# Patient Record
Sex: Female | Born: 1945 | Race: White | Hispanic: No | Smoking: Current every day smoker
Health system: Southern US, Community
[De-identification: ages and names within clinical notes are randomized; demographics above are authoritative.]

## PROBLEM LIST (undated history)

## (undated) DIAGNOSIS — F0150 Vascular dementia without behavioral disturbance: Secondary | ICD-10-CM

## (undated) DIAGNOSIS — C801 Malignant (primary) neoplasm, unspecified: Secondary | ICD-10-CM

## (undated) DIAGNOSIS — E785 Hyperlipidemia, unspecified: Secondary | ICD-10-CM

## (undated) DIAGNOSIS — F0390 Unspecified dementia without behavioral disturbance: Secondary | ICD-10-CM

## (undated) DIAGNOSIS — G309 Alzheimer's disease, unspecified: Secondary | ICD-10-CM

## (undated) DIAGNOSIS — R4701 Aphasia: Secondary | ICD-10-CM

## (undated) DIAGNOSIS — F0280 Dementia in other diseases classified elsewhere without behavioral disturbance: Secondary | ICD-10-CM

## (undated) DIAGNOSIS — R569 Unspecified convulsions: Secondary | ICD-10-CM

## (undated) HISTORY — PX: ABDOMINAL HYSTERECTOMY: SHX81

---

## 2012-07-19 ENCOUNTER — Inpatient Hospital Stay: Payer: Self-pay | Admitting: Internal Medicine

## 2012-07-19 LAB — CBC WITH DIFFERENTIAL/PLATELET
Basophil %: 1.1 %
Eosinophil #: 0 10*3/uL (ref 0.0–0.7)
Eosinophil %: 0.1 %
HCT: 37.9 % (ref 35.0–47.0)
HGB: 12.7 g/dL (ref 12.0–16.0)
Lymphocyte #: 1.3 10*3/uL (ref 1.0–3.6)
Lymphocyte %: 18.1 %
MCH: 33.8 pg (ref 26.0–34.0)
MCHC: 33.5 g/dL (ref 32.0–36.0)
Monocyte #: 0.7 x10 3/mm (ref 0.2–0.9)
Neutrophil #: 5.2 10*3/uL (ref 1.4–6.5)
Neutrophil %: 71.3 %
Platelet: 187 10*3/uL (ref 150–440)
RBC: 3.76 10*6/uL — ABNORMAL LOW (ref 3.80–5.20)
RDW: 14.2 % (ref 11.5–14.5)

## 2012-07-19 LAB — COMPREHENSIVE METABOLIC PANEL
Albumin: 4 g/dL (ref 3.4–5.0)
Anion Gap: 5 — ABNORMAL LOW (ref 7–16)
Calcium, Total: 9.6 mg/dL (ref 8.5–10.1)
Co2: 28 mmol/L (ref 21–32)
Creatinine: 0.87 mg/dL (ref 0.60–1.30)
EGFR (African American): >60
Potassium: 3.5 mmol/L (ref 3.5–5.1)
SGPT (ALT): 21 U/L (ref 12–78)
Sodium: 143 mmol/L (ref 136–145)
Total Protein: 6.9 g/dL (ref 6.4–8.2)

## 2012-07-19 LAB — CK TOTAL AND CKMB (NOT AT ARMC)
CK, Total: 441 U/L — ABNORMAL HIGH (ref 21–215)
CK-MB: 10.9 ng/mL — ABNORMAL HIGH (ref 0.5–3.6)

## 2012-07-19 LAB — ETHANOL
Ethanol %: <0.003 % (ref 0.000–0.080)
Ethanol: <3 mg/dL

## 2012-07-19 LAB — URINALYSIS, COMPLETE
Bacteria: NONE SEEN
Bilirubin,UR: NEGATIVE
Nitrite: NEGATIVE
RBC,UR: 2 /HPF (ref 0–5)
WBC UR: 16 /HPF (ref 0–5)

## 2012-07-19 LAB — TSH: Thyroid Stimulating Horm: 2.58 u[IU]/mL

## 2012-07-20 DIAGNOSIS — R55 Syncope and collapse: Secondary | ICD-10-CM

## 2012-07-20 LAB — BASIC METABOLIC PANEL
Co2: 28 mmol/L (ref 21–32)
Glucose: 121 mg/dL — ABNORMAL HIGH (ref 65–99)
Osmolality: 300 (ref 275–301)
Sodium: 145 mmol/L (ref 136–145)

## 2012-07-20 LAB — CK: CK, Total: 433 U/L — ABNORMAL HIGH (ref 21–215)

## 2012-07-20 LAB — CBC WITH DIFFERENTIAL/PLATELET
HGB: 14.2 g/dL (ref 12.0–16.0)
Lymphocyte #: 1.3 10*3/uL (ref 1.0–3.6)
MCV: 102 fL — ABNORMAL HIGH (ref 80–100)
Monocyte #: 0.5 x10 3/mm (ref 0.2–0.9)
Monocyte %: 7.7 %
Platelet: 176 10*3/uL (ref 150–440)
RBC: 4.13 10*6/uL (ref 3.80–5.20)

## 2012-07-20 LAB — TROPONIN I: Troponin-I: 0.04 ng/mL

## 2012-07-20 LAB — MAGNESIUM: Magnesium: 1.8 mg/dL

## 2012-07-21 LAB — BASIC METABOLIC PANEL
BUN: 17 mg/dL (ref 7–18)
BUN: 13 mg/dL (ref 7–18)
Calcium, Total: 7.8 mg/dL — ABNORMAL LOW (ref 8.5–10.1)
Chloride: 115 mmol/L — ABNORMAL HIGH (ref 98–107)
Co2: 27 mmol/L (ref 21–32)
Creatinine: 0.45 mg/dL — ABNORMAL LOW (ref 0.60–1.30)
Creatinine: 0.8 mg/dL (ref 0.60–1.30)
EGFR (African American): >60
EGFR (African American): >60
EGFR (Non-African Amer.): >60
Glucose: 75 mg/dL (ref 65–99)
Glucose: 106 mg/dL — ABNORMAL HIGH (ref 65–99)
Osmolality: 284 (ref 275–301)
Potassium: 3.1 mmol/L — ABNORMAL LOW (ref 3.5–5.1)
Potassium: 3.2 mmol/L — ABNORMAL LOW (ref 3.5–5.1)
Sodium: 148 mmol/L — ABNORMAL HIGH (ref 136–145)
Sodium: 142 mmol/L (ref 136–145)

## 2012-07-21 LAB — MAGNESIUM
Magnesium: 1.4 mg/dL — ABNORMAL LOW
Magnesium: 1.5 mg/dL — ABNORMAL LOW

## 2012-08-03 LAB — URINALYSIS, COMPLETE
Bacteria: NONE SEEN
Bilirubin,UR: NEGATIVE
Glucose,UR: NEGATIVE mg/dL (ref 0–75)
Protein: NEGATIVE
Squamous Epithelial: 1
WBC UR: 3 /HPF (ref 0–5)

## 2012-08-03 LAB — COMPREHENSIVE METABOLIC PANEL
Albumin: 3.6 g/dL (ref 3.4–5.0)
Alkaline Phosphatase: 66 U/L (ref 50–136)
Anion Gap: 5 — ABNORMAL LOW (ref 7–16)
BUN: 23 mg/dL — ABNORMAL HIGH (ref 7–18)
Chloride: 109 mmol/L — ABNORMAL HIGH (ref 98–107)
EGFR (African American): >60
EGFR (Non-African Amer.): >60
Glucose: 128 mg/dL — ABNORMAL HIGH (ref 65–99)
Osmolality: 288 (ref 275–301)
SGOT(AST): 39 U/L — ABNORMAL HIGH (ref 15–37)
SGPT (ALT): 21 U/L (ref 12–78)
Sodium: 142 mmol/L (ref 136–145)
Total Protein: 7.1 g/dL (ref 6.4–8.2)

## 2012-08-03 LAB — DRUG SCREEN, URINE
Amphetamines, Ur Screen: NEGATIVE (ref ?–1000)
Benzodiazepine, Ur Scrn: NEGATIVE (ref ?–200)
MDMA (Ecstasy)Ur Screen: NEGATIVE (ref ?–500)
Methadone, Ur Screen: NEGATIVE (ref ?–300)
Phencyclidine (PCP) Ur S: NEGATIVE (ref ?–25)
Tricyclic, Ur Screen: NEGATIVE (ref ?–1000)

## 2012-08-03 LAB — CBC
HGB: 13.3 g/dL (ref 12.0–16.0)
MCHC: 33.8 g/dL (ref 32.0–36.0)
MCV: 100 fL (ref 80–100)
RBC: 3.95 10*6/uL (ref 3.80–5.20)

## 2012-08-03 LAB — ETHANOL: Ethanol: <3 mg/dL

## 2012-08-03 LAB — TSH: Thyroid Stimulating Horm: 0.97 u[IU]/mL

## 2012-08-04 ENCOUNTER — Inpatient Hospital Stay: Payer: Self-pay | Admitting: Internal Medicine

## 2012-08-04 LAB — CK TOTAL AND CKMB (NOT AT ARMC)
CK, Total: 270 U/L — ABNORMAL HIGH (ref 21–215)
CK, Total: 186 U/L (ref 21–215)
CK, Total: 172 U/L (ref 21–215)
CK-MB: 7.9 ng/mL — ABNORMAL HIGH (ref 0.5–3.6)
CK-MB: 4.8 ng/mL — ABNORMAL HIGH (ref 0.5–3.6)
CK-MB: 4.2 ng/mL — ABNORMAL HIGH (ref 0.5–3.6)

## 2012-08-04 LAB — TROPONIN I: Troponin-I: <0.02 ng/mL

## 2012-08-05 LAB — CBC WITH DIFFERENTIAL/PLATELET
Basophil #: 0 10*3/uL (ref 0.0–0.1)
Basophil %: 0.6 %
Eosinophil #: 0.1 10*3/uL (ref 0.0–0.7)
Eosinophil %: 1.3 %
HCT: 33.3 % — ABNORMAL LOW (ref 35.0–47.0)
HGB: 11.3 g/dL — ABNORMAL LOW (ref 12.0–16.0)
Lymphocyte %: 30.2 %
MCH: 34.1 pg — ABNORMAL HIGH (ref 26.0–34.0)
MCHC: 33.9 g/dL (ref 32.0–36.0)
Monocyte #: 0.4 x10 3/mm (ref 0.2–0.9)
Neutrophil %: 60 %
Platelet: 292 10*3/uL (ref 150–440)
RBC: 3.31 10*6/uL — ABNORMAL LOW (ref 3.80–5.20)
WBC: 4.6 10*3/uL (ref 3.6–11.0)

## 2012-08-05 LAB — COMPREHENSIVE METABOLIC PANEL WITH GFR
Albumin: 2.6 g/dL — ABNORMAL LOW
Alkaline Phosphatase: 50 U/L
Anion Gap: 5 — ABNORMAL LOW
BUN: 22 mg/dL — ABNORMAL HIGH
Bilirubin,Total: 0.2 mg/dL
Calcium, Total: 7.4 mg/dL — ABNORMAL LOW
Chloride: 115 mmol/L — ABNORMAL HIGH
Co2: 26 mmol/L
Creatinine: 0.62 mg/dL
EGFR (African American): >60
EGFR (Non-African Amer.): >60
Glucose: 65 mg/dL
Osmolality: 292
Potassium: 3.5 mmol/L
SGOT(AST): 30 U/L
SGPT (ALT): 15 U/L
Sodium: 146 mmol/L — ABNORMAL HIGH
Total Protein: 5.1 g/dL — ABNORMAL LOW

## 2012-08-05 LAB — LIPID PANEL
Cholesterol: 137 mg/dL (ref 0–200)
HDL Cholesterol: 37 mg/dL — ABNORMAL LOW (ref 40–60)
Ldl Cholesterol, Calc: 82 mg/dL (ref 0–100)

## 2012-08-05 LAB — MAGNESIUM: Magnesium: 1.8 mg/dL

## 2012-08-06 LAB — URINE CULTURE

## 2012-08-08 LAB — BASIC METABOLIC PANEL
Anion Gap: 3 — ABNORMAL LOW (ref 7–16)
BUN: 9 mg/dL (ref 7–18)
Calcium, Total: 8.3 mg/dL — ABNORMAL LOW (ref 8.5–10.1)
Co2: 32 mmol/L (ref 21–32)
EGFR (African American): >60
EGFR (Non-African Amer.): >60
Glucose: 76 mg/dL (ref 65–99)
Osmolality: 288 (ref 275–301)
Potassium: 3.9 mmol/L (ref 3.5–5.1)
Sodium: 146 mmol/L — ABNORMAL HIGH (ref 136–145)

## 2012-08-10 LAB — CULTURE, BLOOD (SINGLE)

## 2012-08-11 LAB — BASIC METABOLIC PANEL
BUN: 13 mg/dL (ref 7–18)
Calcium, Total: 9.2 mg/dL (ref 8.5–10.1)
Chloride: 109 mmol/L — ABNORMAL HIGH (ref 98–107)
Co2: 32 mmol/L (ref 21–32)
Creatinine: 0.59 mg/dL — ABNORMAL LOW (ref 0.60–1.30)
EGFR (African American): >60
EGFR (Non-African Amer.): >60
Glucose: 82 mg/dL (ref 65–99)
Potassium: 4.2 mmol/L (ref 3.5–5.1)

## 2012-09-05 ENCOUNTER — Inpatient Hospital Stay: Payer: Self-pay | Admitting: Family Medicine

## 2012-09-05 LAB — COMPREHENSIVE METABOLIC PANEL
Anion Gap: 5 — ABNORMAL LOW (ref 7–16)
BUN: 20 mg/dL — ABNORMAL HIGH (ref 7–18)
Bilirubin,Total: 0.3 mg/dL (ref 0.2–1.0)
Chloride: 105 mmol/L (ref 98–107)
Co2: 32 mmol/L (ref 21–32)
EGFR (Non-African Amer.): >60
Potassium: 3.9 mmol/L (ref 3.5–5.1)

## 2012-09-05 LAB — DRUG SCREEN, URINE
Amphetamines, Ur Screen: NEGATIVE (ref ?–1000)
Benzodiazepine, Ur Scrn: NEGATIVE (ref ?–200)
Cannabinoid 50 Ng, Ur ~~LOC~~: NEGATIVE (ref ?–50)
MDMA (Ecstasy)Ur Screen: NEGATIVE (ref ?–500)
Tricyclic, Ur Screen: NEGATIVE (ref ?–1000)

## 2012-09-05 LAB — URINALYSIS, COMPLETE
Bilirubin,UR: NEGATIVE
Blood: NEGATIVE
Glucose,UR: NEGATIVE mg/dL (ref 0–75)
Ketone: NEGATIVE
Leukocyte Esterase: NEGATIVE
Nitrite: NEGATIVE
Protein: NEGATIVE
RBC,UR: 1 /HPF (ref 0–5)
Specific Gravity: 1.011 (ref 1.003–1.030)
Squamous Epithelial: 1
WBC UR: <1 /HPF (ref 0–5)

## 2012-09-05 LAB — CBC WITH DIFFERENTIAL/PLATELET
Basophil #: 0.1 10*3/uL (ref 0.0–0.1)
Eosinophil %: 1.3 %
HCT: 41.9 % (ref 35.0–47.0)
Lymphocyte #: 1.4 10*3/uL (ref 1.0–3.6)
Monocyte #: 0.7 x10 3/mm (ref 0.2–0.9)
Monocyte %: 4.9 %
Neutrophil #: 11.5 10*3/uL — ABNORMAL HIGH (ref 1.4–6.5)
Neutrophil %: 82.5 %
Platelet: 282 10*3/uL (ref 150–440)
RBC: 4.15 10*6/uL (ref 3.80–5.20)
WBC: 14 10*3/uL — ABNORMAL HIGH (ref 3.6–11.0)

## 2012-09-05 LAB — CK-MB: CK-MB: 1.6 ng/mL (ref 0.5–3.6)

## 2012-09-05 LAB — TROPONIN I: Troponin-I: 0.05 ng/mL

## 2012-09-05 LAB — T4, FREE: Free Thyroxine: 0.83 ng/dL (ref 0.76–1.46)

## 2012-09-05 LAB — AMYLASE: Amylase: 64 U/L (ref 25–115)

## 2012-09-06 DIAGNOSIS — R079 Chest pain, unspecified: Secondary | ICD-10-CM

## 2012-09-06 LAB — CBC WITH DIFFERENTIAL/PLATELET
Basophil #: 0 10*3/uL (ref 0.0–0.1)
Eosinophil #: 0 10*3/uL (ref 0.0–0.7)
Eosinophil %: 0 %
HCT: 35.5 % (ref 35.0–47.0)
HGB: 11.8 g/dL — ABNORMAL LOW (ref 12.0–16.0)
Lymphocyte #: 0.7 10*3/uL — ABNORMAL LOW (ref 1.0–3.6)
MCH: 33.2 pg (ref 26.0–34.0)
Neutrophil #: 11.4 10*3/uL — ABNORMAL HIGH (ref 1.4–6.5)
Platelet: 243 10*3/uL (ref 150–440)

## 2012-09-06 LAB — LIPID PANEL
Cholesterol: 203 mg/dL — ABNORMAL HIGH (ref 0–200)
Ldl Cholesterol, Calc: 119 mg/dL — ABNORMAL HIGH (ref 0–100)
Triglycerides: 67 mg/dL (ref 0–200)
VLDL Cholesterol, Calc: 13 mg/dL (ref 5–40)

## 2012-09-06 LAB — COMPREHENSIVE METABOLIC PANEL
Albumin: 3.2 g/dL — ABNORMAL LOW (ref 3.4–5.0)
Alkaline Phosphatase: 73 U/L (ref 50–136)
Anion Gap: 8 (ref 7–16)
Bilirubin,Total: 0.4 mg/dL (ref 0.2–1.0)
Chloride: 99 mmol/L (ref 98–107)
Co2: 26 mmol/L (ref 21–32)
EGFR (African American): >60
Glucose: 157 mg/dL — ABNORMAL HIGH (ref 65–99)
SGOT(AST): 27 U/L (ref 15–37)
Sodium: 133 mmol/L — ABNORMAL LOW (ref 136–145)
Total Protein: 6.3 g/dL — ABNORMAL LOW (ref 6.4–8.2)

## 2012-09-06 LAB — MAGNESIUM: Magnesium: 1.3 mg/dL — ABNORMAL LOW

## 2012-09-06 LAB — CK-MB: CK-MB: 1.3 ng/mL (ref 0.5–3.6)

## 2012-09-08 LAB — CBC WITH DIFFERENTIAL/PLATELET
Basophil #: 0.1 10*3/uL (ref 0.0–0.1)
Basophil %: 0.6 %
Eosinophil %: 0.1 %
HGB: 11.1 g/dL — ABNORMAL LOW (ref 12.0–16.0)
Lymphocyte %: 14.1 %
MCH: 33.5 pg (ref 26.0–34.0)
MCHC: 34.2 g/dL (ref 32.0–36.0)
MCV: 98 fL (ref 80–100)
Monocyte #: 1.1 x10 3/mm — ABNORMAL HIGH (ref 0.2–0.9)
Monocyte %: 13.2 %
Neutrophil #: 5.8 10*3/uL (ref 1.4–6.5)
Neutrophil %: 72 %
Platelet: 182 10*3/uL (ref 150–440)
RBC: 3.3 10*6/uL — ABNORMAL LOW (ref 3.80–5.20)
WBC: 8.1 10*3/uL (ref 3.6–11.0)

## 2012-09-08 LAB — BASIC METABOLIC PANEL
BUN: 5 mg/dL — ABNORMAL LOW (ref 7–18)
Chloride: 100 mmol/L (ref 98–107)
Creatinine: 0.54 mg/dL — ABNORMAL LOW (ref 0.60–1.30)
Glucose: 94 mg/dL (ref 65–99)
Potassium: 3.5 mmol/L (ref 3.5–5.1)
Sodium: 133 mmol/L — ABNORMAL LOW (ref 136–145)

## 2012-09-08 LAB — MAGNESIUM: Magnesium: 1.8 mg/dL

## 2012-09-08 LAB — ALBUMIN: Albumin: 2.6 g/dL — ABNORMAL LOW (ref 3.4–5.0)

## 2012-09-09 LAB — CBC WITH DIFFERENTIAL/PLATELET
Basophil #: 0 10*3/uL (ref 0.0–0.1)
Eosinophil #: 0 10*3/uL (ref 0.0–0.7)
HCT: 42 % (ref 35.0–47.0)
Lymphocyte #: 1.6 10*3/uL (ref 1.0–3.6)
Lymphocyte %: 16.8 %
MCH: 33.1 pg (ref 26.0–34.0)
MCHC: 33 g/dL (ref 32.0–36.0)
Monocyte #: 1.1 x10 3/mm — ABNORMAL HIGH (ref 0.2–0.9)
Monocyte %: 11.1 %
Neutrophil #: 6.7 10*3/uL — ABNORMAL HIGH (ref 1.4–6.5)
Neutrophil %: 71.3 %
Platelet: 227 10*3/uL (ref 150–440)
WBC: 9.5 10*3/uL (ref 3.6–11.0)

## 2012-09-09 LAB — BASIC METABOLIC PANEL
Anion Gap: 6 — ABNORMAL LOW (ref 7–16)
BUN: 2 mg/dL — ABNORMAL LOW (ref 7–18)
Calcium, Total: 8.4 mg/dL — ABNORMAL LOW (ref 8.5–10.1)
Chloride: 104 mmol/L (ref 98–107)
EGFR (Non-African Amer.): >60
Potassium: 2.9 mmol/L — ABNORMAL LOW (ref 3.5–5.1)

## 2012-09-09 LAB — MAGNESIUM: Magnesium: 2.2 mg/dL

## 2012-09-10 LAB — BASIC METABOLIC PANEL
Anion Gap: 4 — ABNORMAL LOW (ref 7–16)
BUN: 6 mg/dL — ABNORMAL LOW (ref 7–18)
Chloride: 106 mmol/L (ref 98–107)
Co2: 31 mmol/L (ref 21–32)
Creatinine: 0.52 mg/dL — ABNORMAL LOW (ref 0.60–1.30)
EGFR (African American): >60
Glucose: 90 mg/dL (ref 65–99)
Sodium: 141 mmol/L (ref 136–145)

## 2012-09-10 LAB — CBC WITH DIFFERENTIAL/PLATELET
Basophil #: 0 10*3/uL (ref 0.0–0.1)
Basophil %: 0.5 %
Eosinophil #: 0.1 10*3/uL (ref 0.0–0.7)
HGB: 11.5 g/dL — ABNORMAL LOW (ref 12.0–16.0)
Lymphocyte #: 1.1 10*3/uL (ref 1.0–3.6)
MCV: 98 fL (ref 80–100)
Monocyte %: 10.9 %
Neutrophil %: 74.6 %
Platelet: 198 10*3/uL (ref 150–440)
WBC: 8.8 10*3/uL (ref 3.6–11.0)

## 2014-01-28 ENCOUNTER — Inpatient Hospital Stay: Payer: Self-pay | Admitting: Internal Medicine

## 2014-01-28 LAB — TROPONIN I: TROPONIN-I: 0.04 ng/mL

## 2014-01-28 LAB — DRUG SCREEN, URINE

## 2014-01-28 LAB — URINALYSIS, COMPLETE
BACTERIA: NONE SEEN
BLOOD: NEGATIVE
Bilirubin,UR: NEGATIVE
GLUCOSE, UR: NEGATIVE mg/dL (ref 0–75)
LEUKOCYTE ESTERASE: NEGATIVE
NITRITE: NEGATIVE
Ph: 5 (ref 4.5–8.0)
Protein: NEGATIVE
RBC,UR: 1 /HPF (ref 0–5)
Specific Gravity: 1.049 (ref 1.003–1.030)
Squamous Epithelial: NONE SEEN
WBC UR: 2 /HPF (ref 0–5)

## 2014-01-28 LAB — COMPREHENSIVE METABOLIC PANEL
ALBUMIN: 2.9 g/dL — AB (ref 3.4–5.0)
Alkaline Phosphatase: 101 U/L
Anion Gap: 4 — ABNORMAL LOW (ref 7–16)
BILIRUBIN TOTAL: 0.2 mg/dL (ref 0.2–1.0)
BUN: 25 mg/dL — ABNORMAL HIGH (ref 7–18)
CHLORIDE: 105 mmol/L (ref 98–107)
Calcium, Total: 8.3 mg/dL — ABNORMAL LOW (ref 8.5–10.1)
Co2: 32 mmol/L (ref 21–32)
Creatinine: 0.68 mg/dL (ref 0.60–1.30)
EGFR (Non-African Amer.): >60
Glucose: 96 mg/dL (ref 65–99)
OSMOLALITY: 286 (ref 275–301)
Potassium: 3.8 mmol/L (ref 3.5–5.1)
SGOT(AST): 33 U/L (ref 15–37)
SGPT (ALT): 21 U/L
Sodium: 141 mmol/L (ref 136–145)
TOTAL PROTEIN: 6.4 g/dL (ref 6.4–8.2)

## 2014-01-28 LAB — CBC
HCT: 44 % (ref 35.0–47.0)
HGB: 14.3 g/dL (ref 12.0–16.0)
MCH: 34.2 pg — AB (ref 26.0–34.0)
MCHC: 32.5 g/dL (ref 32.0–36.0)
MCV: 105 fL — AB (ref 80–100)
Platelet: 302 10*3/uL (ref 150–440)
RBC: 4.19 10*6/uL (ref 3.80–5.20)
RDW: 14.6 % — ABNORMAL HIGH (ref 11.5–14.5)
WBC: 8.6 10*3/uL (ref 3.6–11.0)

## 2014-01-28 LAB — TSH: Thyroid Stimulating Horm: 0.988 u[IU]/mL

## 2014-01-28 LAB — ETHANOL

## 2014-01-30 LAB — AMMONIA: AMMONIA, PLASMA: 36 umol/L — AB (ref 11–32)

## 2014-01-30 LAB — TSH: Thyroid Stimulating Horm: 0.861 u[IU]/mL

## 2014-06-13 NOTE — Consult Note (Signed)
Chief Complaint:  Subjective/Chief Complaint seen for nausea and possible abdominal apin.  Patietn denies abdominalpain or nausea.   VITAL SIGNS/ANCILLARY NOTES: **Vital Signs.:   20-Jul-14 12:00  Vital Signs Type Routine  Temperature Temperature (F) 99  Celsius 37.2  Temperature Source oral  Pulse Pulse 72  Pulse source if not from Vital Sign Device per cardiac monitor  Respirations Respirations 19  Systolic BP Systolic BP 004  Diastolic BP (mmHg) Diastolic BP (mmHg) 45  Mean BP 74  Pulse Ox % Pulse Ox % 95  Pulse Ox Activity Level  At rest  Oxygen Delivery Room Air/ 21 %  Pulse Ox Heart Rate 70  *Intake and Output.:   20-Jul-14 15:53  Stool  2 medium dARK BROWN STOOL   Brief Assessment:  Cardiac Irregular   Respiratory clear BS   Gastrointestinal details normal Soft  Nontender  Nondistended  No masses palpable  Bowel sounds normal   Lab Results: Oncology:  17-Jul-14 14:52   Carbohydrate Antigen 19-9  78 (Roche ECLIA methodology            [facility]            No: [id number]           [address] [place], [place] [address]           Lindon Romp, MD         [phone] Result(s) reported on 08 Sep 2012 at 05:48PM.)  Routine Chem:  20-Jul-14 05:47   Glucose, Serum  100  BUN  2  Creatinine (comp)  0.52  Sodium, Serum 141  Potassium, Serum  2.9  Chloride, Serum 104  CO2, Serum 31  Calcium (Total), Serum  8.4  Anion Gap  6  Osmolality (calc) 278  eGFR (African American) >60  eGFR (Non-African American) >60 (eGFR values <94m/min/1.73 m2 may be an indication of chronic kidney disease (CKD). Calculated eGFR is useful in patients with stable renal function. The eGFR calculation will not be reliable in acutely ill patients when serum creatinine is changing rapidly. It is not useful in  patients on dialysis. The eGFR calculation may not be applicable to patients at the low and high extremes of body sizes, pregnant women, and vegetarians.)   Magnesium, Serum 2.2 (1.8-2.4 THERAPEUTIC RANGE: 4-7 mg/dL TOXIC: > 10 mg/dL  -----------------------)  Routine Hem:  20-Jul-14 05:47   WBC (CBC) 9.5  RBC (CBC) 4.20  Hemoglobin (CBC) 13.9  Hematocrit (CBC) 42.0  Platelet Count (CBC) 227  MCV 100  MCH 33.1  MCHC 33.0  RDW 13.2  Neutrophil % 71.3  Lymphocyte % 16.8  Monocyte % 11.1  Eosinophil % 0.3  Basophil % 0.5  Neutrophil #  6.7  Lymphocyte # 1.6  Monocyte #  1.1  Eosinophil # 0.0  Basophil # 0.0 (Result(s) reported on 09 Sep 2012 at 06:39AM.)   Assessment/Plan:  Assessment/Plan:  Assessment 1) nausea abdominalpain-resolved.   2) abnormal CT scan-cystic lesion in the tail of the pancreas.  mild elevation of ca 19-9. 3) seizure d/o, ams   Plan 1) CT with pancreatic protocol when clinically feasible.   Electronic Signatures: SLoistine Simas(MD)  (Signed 20-Jul-14 16:08)  Authored: Chief Complaint, VITAL SIGNS/ANCILLARY NOTES, Brief Assessment, Lab Results, Assessment/Plan   Last Updated: 20-Jul-14 16:08 by SLoistine Simas(MD)

## 2014-06-13 NOTE — Consult Note (Signed)
Chief Complaint:  Subjective/Chief Complaint Delayed entry, patient seen about 6pm. Patient yawns, does not appear uncomfortable, ?post ictal, no meaningful response to examiner.  Patient seen for abdominal pain and nausea, both apparently resolved.  CT on admission showing cystic lesion in the pancreatic tail.  Patient unable to take po contrast for repeat ct due to changing clinical situation.   VITAL SIGNS/ANCILLARY NOTES: **Vital Signs.:   18-Jul-14 18:25  Vital Signs Type Routine  Temperature Temperature (F) 99.5  Celsius 37.5  Temperature Source axillary  Pulse Pulse 58  Respirations Respirations 24  Systolic BP Systolic BP 132  Diastolic BP (mmHg) Diastolic BP (mmHg) 40  Mean BP 60  Pulse Ox % Pulse Ox % 99  Pulse Ox Activity Level  At rest  Oxygen Delivery 2L  Pulse Ox Heart Rate 58   Brief Assessment:  Cardiac Irregular   Respiratory clear BS   Gastrointestinal details normal Soft  Nontender  Nondistended  Bowel sounds normal   Lab Results: Oncology:  17-Jul-14 14:52   Carbohydrate Antigen 19-9  [address] [facility] methodology            [facility]            No: [id number]           [address] [place], [place] [address]           Lindon Romp, MD         [phone] Result(s) reported on 07 Sep 2012 at 10:19AM.)  Lab:  18-Jul-14 11:20   pH (ABG)  7.37  PCO2  48  PO2 82  FiO2 28  Base Excess 1.7  HCO3  27.7  O2 Saturation 97.2  O2 Device CANNULA  Specimen Site (ABG) RT BRACHIAL  Specimen Type (ABG) ARTERIAL  Patient Temp (ABG) 37.0 (Result(s) reported on 07 Sep 2012 at 11:28AM.)  Routine Chem:  18-Jul-14 06:37   B-Type Natriuretic Peptide [facility])  2508 (Result(s) reported on 07 Sep 2012 at 07:12AM.)   Radiology Results: CT:    18-Jul-14 12:01, CT Head Without Contrast  CT Head Without Contrast   REASON FOR EXAM:    seizure  COMMENTS:       PROCEDURE: CT  - CT HEAD WITHOUT CONTRAST  - Sep 07 2012 12:01PM     RESULT: Noncontrast  CT of the brain is compared to the study of   09/05/2012. There is no previous study for comparison.    There is somelow-attenuation diffusely in the periventricular and   subcortical white matter consistent with chronic microvascular ischemic   disease which was present previously. There is no evidence of   intracranial hemorrhage, mass, mass effect or midline shift. No evolving   infarct is appreciated. There is some minimal density in the dependent   portion of the right maxillary sinus which could represent sinusitis.   Sinuses are otherwise clear. The calvarium is intact. The mastoid air     cells appear unremarkable.    IMPRESSION:  Stable CT of the brain with changes of atrophy and chronic   microvascular ischemic disease. If there is concern for an acute ischemic   event followup MRI would be recommended.    Dictation Site: 1        Verified By: Sundra Aland, M.D., MD   Assessment/Plan:  Assessment/Plan:  Assessment 1) nausea, abdominal pain-resolved clinically 2) abnormal ct with cystic lesion in the tail of the pancreas.  mild elevation of ca19-9.  Patient did not get pancreatic  protocol CT today due to change of clinical situation/new onset seizure   Plan 1) further evaluation via CT of pancreas when clinically feasible.  following   Electronic Signatures: Loistine Simas (MD)  (Signed 18-Jul-14 23:46)  Authored: Chief Complaint, VITAL SIGNS/ANCILLARY NOTES, Brief Assessment, Lab Results, Radiology Results, Assessment/Plan   Last Updated: 18-Jul-14 23:46 by Loistine Simas (MD)

## 2014-06-13 NOTE — Discharge Summary (Signed)
PATIENT NAME:  Traci Holmes, Traci Holmes MR#:  [id number] DATE OF BIRTH:  01/18/1946  DATE OF ADMISSION:  09/05/2012 DATE OF DISCHARGE:  09/11/2012  CONTINUATION   OTHER IMPORTANT TESTS:  MRI of the brain: Prominent white matter changes consistent with chronic ischemia. These changes are severe.  Other etiologies could be white matter disease such as MS or vasculitis, although clinically the patient did not show any of that.  CT of the abdomen for pancreatic lesion shows a cystic pancreatic tail lesion; a cystic pancreatic tumor cannot be excluded, however, given the calcifications in the pancreas suggesting more chronic pancreatitis, the cyst could represent a pseudocyst.  CT of the chest for pulmonary embolism: No pulmonary embolus, +3 mm nodular density versus atelectasis of the right lung base. Follow-up CT in 6 months. Small cyst noticed on the pancreas as mentioned above, hepatic renal cyst.  CT of the head without contrast: No acute intracranial abnormalities. Air-fluid levels in the sinuses.   HISTORY OF PRESENT ILLNESS: [name] is a very nice 69 year old female who I had the pleasure to care for in previous hospitalizations. The patient was brought into the Emergency Department on 09/05/2012. Please refer to H and P dictated by Dr. Margaretmary Eddy. At that moment, she was brought via EMS due to persistent nausea. She started taking medicine for it, but it did not go away. She has not had any emesis, was having chills but no fever. Her CAT scan of the head was done in the ER, and the patient was lethargic. There were no acute findings. The patient had leukocytosis and some changes on her CT scan of the chest for which she was started on azithromycin and Zosyn in the ER. Narcan was given as well without any significant improvement. She had ABGs showing severe respiratory acidosis with a pCO2 of 69 and was hypoxemic with a pO2 of 44. Her EKG had no acute ST depression or elevation, but her troponin was slightly  elevated. She was put on BiPAP with 40% FiO2. The patient was evaluated for a possible non-ST elevation MI, but after her workup and after cardiology consultation, it was determined that the patient instead of having a non-ST elevation MI was having demand ischemia.   HOSPITAL COURSE:  So, the patient was admitted for the treatment of these conditions as mentioned above. Dr. Humphrey Rolls saw the patient.  He ordered an echocardiogram. The echocardiogram did not show any significant decrease of ejection fraction. Her ejection fraction was 60 to 65%. The patient did not have any further  chest pain.  The nausea resolved with medications, and also her troponins normalized to 0.05 after that; so, she was diagnosed as demand ischemia.  She was also seen by Dr. Gabriel Carina due to elevated TSH. She did not recommend starting with thyroxine at this time as that elevation of TSH could be related to an increase in metabolic rate which she might not be able to tolerate due to severe illness. She recommended to repeat the TSH in the near future to make a better judgment as far as if she really needed to be on levothyroxine or not. She also was seen by GI for nausea, and according to this they did not have any further procedures.   Since she has acute respiratory acidosis and leukocytosis, that could be just related to cough and infection and COPD exacerbation. She had a CT scan showing a mass on the pancreas, cystic mass, for which Dr. Gustavo Lah followed closely. He recommended to have  a CT scan of the pancreas whenever the patient was better, and that was done at discharge, and it did not show any acute tumors, most likely to be a cystic mass due to chronic pancreatitis.   The patient had an episode of altered mental status with a possible seizure, getting more lethargic.  Due to her COPD exacerbation, Pulmonary was admitted.  They decided to stop the steroids, and they started Advair and Spiriva. The patient had an episode of  tonic-clonic seizure, with a CT of the head that was negative on July 18th for which Neurology was consulted. Neurology recommended to load with fosphenytoin and then start [place]. EEG was done and reviewed by Dr. Manuella Ghazi, and he did not find any subclinical seizures present at the moment.  Extensive subcortical white matter hypodensities on the CT were presumed to be secondary to microvascular ischemic disease. An MRI was done that actually corroborated these findings.   History of dementia. The patient was taking donepezil, but as the patient was bradycardic, we stopped it.  I discussed that with Dr. Manuella Ghazi, who agreed with it as the benefits of taking that medication are pretty modest, or known, on her advanced dementia, and he was okay with keeping her off of it. She remained having significant altered mental status after the seizures with very slow recovery of the confusion, likely with metabolic encephalopathy for which we kept her on the ICU.  We transitioned her to the regular floor, and once she was a little bit better she was able to be discharged. As far as discussion with Dr. Manuella Ghazi, he states that we might not find a reasonable explanation for the seizure, likely was due to metabolic encephalopathy, metabolic changes.   CONDITION ON DISCHARGE: The patient is discharged in good condition.   FOLLOWUP: At Dr. Jerene Dilling to recheck that TSH within the next week or two, also to recheck her electrolytes and CBC.   The COPD exacerbation, the patient is no longer taking antibiotics. There was no sign of pneumonia.  Te patient is breathing back to her normal.  I had a long discussion with her family as far as the progression of dementia. Education to the son about what to expect with the dementia has been given. The son voices understanding. The patient is discharged in good condition.   TIME TAKEN: I spent about 60 minutes with this discharge on the day of discharge.   ____________________________ [place] Sink, MD rsg:cb D: 09/13/2012 18:58:41 ET T: 09/13/2012 23:17:37 ET JOB#: [id number]  cc: [place] Sink, MD, <Dictator> Madine Sarr America Brown MD ELECTRONICALLY SIGNED 09/14/2012 20:49

## 2014-06-13 NOTE — Consult Note (Signed)
Brief Consult Note: Diagnosis: recurrent unprovoked seizure, acidotic, ? dementia (vascular from WM changes),.   Patient was seen by consultant.   Consult note dictated.   Comments: - Recurrent seizures - CT - WM hypodensities - should do MRI brain with and without contrast (h/o uterine Ca). EEG - slow - no subclinical seizures. - agree with fosphenytoin load and conti for 2 days then stop. - Simulatneous start levetiracetam 1000 mg bid for 2 days then switch to 500 bid. - correct hypomagnesemia - ordered Mg Sulfate - h/o dementia - ? vascular - extenisve WM changes on CT. - unfortunately no coverage available over the weekend..  Electronic Signatures: Ray Church (MD)  (Signed [phone] 17:20)  Authored: Brief Consult Note   Last Updated: 18-Jul-14 17:20 by Ray Church (MD)

## 2014-06-13 NOTE — Consult Note (Signed)
PATIENT NAME:  Traci Holmes, Traci Holmes MR#:  [id number] DATE OF BIRTH:  [birth year]  DATE OF CONSULTATION:  09/05/2012  CONSULTING PHYSICIAN:  Dionisio David, MD  This is a 69 year old white female with a past medical history of extensive medical problems came into the Emergency Room apparently with nausea and confusion. I am unable to get any history out of the patient but when she came in, apparently, at that time she was more lucid. She at that time denied chest pain, shortness of breath. She had mildly elevated troponin, thus, I was asked to evaluate the patient. The patient came in the Emergency Room with respiratory acidosis and hypoxia and had elevated troponin.   PAST MEDICAL HISTORY:  History of uterine cancer, history of depression, dementia.   ALLERGIES:  None.   FAMILY HISTORY: She has a family history of CVA and cancer runs in the family. She currently lives at assisted living and smokes 1 pack per day.   PHYSICAL EXAMINATION: GENERAL: She is alert and oriented x 0. VITAL SIGNS:  Pulse is 78, respirations 34, blood pressure 124/37 and oxygen saturation is 100%. HEAD, EYES, EARS, NOSE AND THROAT:  Revealed no JVD.  LUNGS:  Good air entry. No rales.  HEART:  Regular rate and rhythm. Normal S1, S2. No audible murmur.  ABDOMEN:  Soft, nontender, positive bowel sounds.  EXTREMITIES:  No pedal edema.  NEUROLOGIC:  She is alert and oriented x 0 in no acute distress.  EKG shows sinus bradycardia 52 beats per minute, poor R-wave progression, nonspecific ST-T changes.  LABORATORY DATA:  Troponin was 0.12. Her TSH is 17.12. Her BUN and creatinine are normal. Her hemoglobin 13.8, white count is slightly elevated 14.0.   ASSESSMENT AND PLAN: Mildly elevated troponin most likely due to demand ischemia with patient coming in with respiratory acidosis and hypoxia. It is only mildly elevated troponin with no acute changes on EKG. Advise treating underlying pulmonary condition. The patient appears to be  in no distress. Denies any chest pain or shortness of breath at this time. Will follow with you but we do not think this is due to non-ST-segment elevation myocardial infarction.  Thank you very much for the referral.   ____________________________ Dionisio David, MD sak:ce D: 09/05/2012 11:07:28 ET T: 09/05/2012 11:13:23 ET JOB#: [id number]  cc: Dionisio David, MD, <Dictator> Dionisio David MD ELECTRONICALLY SIGNED 10/01/2012 8:12

## 2014-06-13 NOTE — Consult Note (Signed)
Chief Complaint and History:  Referring Physician Dr. Max Sane   Chief Complaint High TSH   Allergies:  No Known Allergies:   Assessment/Plan:  Assessment/Plan 69 yo F seen in consultation for elevated TSH. She was admitted today with a complaint of nausea and lethargy. Initial labs notable for high TSH of 17 and nml free T4 of 0.83. She is a poor historian. Chart reviewed, pt examined and interviewed. No known h/o thyroid disease. No goiter on exam. Lethargic and disoriented.  A/ 1. Abnormal thyroid function tests -- cause may be primary hypothyroidism versus 2nd phase of thyroiditis (hyperthyroidism followed by hypothyroidism) 2. Elevated troponin and concern for NSTEMI 3. COPD exacerbation 4. Dementia  P/ No urgent need for thyroid hormone replacement. May have hypothyroidism, however free T4 is nml which is not typical. Given comorbidities, it is likely safer to wait on giving thyroid hormone then starting it now and possibly increasing metabolic rate. I recommend repeating TSH and free T4 in 7-10 days or once clinical condition has improved. If TSH normalizes, then no treatment may be needed.   Full consult will be dictated.   Case Discussed With patient   Electronic Signatures: Judi Cong (MD)  (Signed 16-Jul-14 17:05)  Authored: Chief Complaint and History, ALLERGIES, Assessment/Plan   Last Updated: 16-Jul-14 17:05 by Judi Cong (MD)

## 2014-06-13 NOTE — Consult Note (Signed)
PATIENT NAME:  Traci Holmes, Traci Holmes MR#:  [id number] DATE OF BIRTH:  [birth year]  DATE OF CONSULTATION:  09/05/2012  REFERRING PHYSICIAN:  Max Sane, MD CONSULTING PHYSICIAN:  A. Lavone Orn, MD  CHIEF COMPLAINT: Elevated TSH level.   HISTORY OF PRESENT ILLNESS: This is a 69 year old female seen in consultation at the request of Dr. Manuella Ghazi for concerns about an elevated TSH level. The patient was admitted earlier today from the ER after presenting with lethargy. She was found to have abnormal lab studies included mild elevation in her troponin and evidence of an acute respiratory acidosis with hypoxia and leukocytosis as well as an incidentally elevated TSH level of 17.10 with a normal free T4 0.3. She has no known history of hypothyroidism, however, she is a poor historian. She remains somewhat confused. She denies any neck pain. No dysphasia. Denies cold intolerance. She denies any skin or hair changes. We have no prior labs to review.   PAST MEDICAL HISTORY: 1.  Uterine cancer.  2.  Dementia.  3.  Depression.   PAST SURGICAL HISTORY: Hysterectomy.   ALLERGIES: No known drug allergies.   SOCIAL HISTORY: The patient lives in an assisted living facility. Smokes 1 pack per day.  Reportedly no alcohol use.   FAMILY HISTORY: The patient does not know, believe mother may have had some sort of thyroid condition, although details not clear.   CURRENT MEDICATIONS: 1.  Atorvastatin 20 mg at bedtime.  2.  Solu-Medrol 60 mg every eight hours.  3.  Nitroglycerin 1 inch topical twice a day.  4.  Zofran 4 mg q. 6 hours.  5.  Pantoprazole 40 mg once daily.  6.  Albuterol ipratropium nebulizer q. 6 hours.  7.  D5 0.45 + 20 KCl at 125 mL/hour.   REVIEW OF SYSTEMS: Difficult to obtain, as the patient is fairly lethargic and not answering many questions.   PHYSICAL EXAMINATION: VITAL SIGNS: Height 60 inches, weight 120 pounds, BMI 23.4. Temperature 99, pulse 72, respirations 20, blood pressure 123/51,  pulse oximetry 100% on 2 liters nasal cannula.  GENERAL: Petite white female in no acute distress. Lethargic, slow to answer questions.  HEENT: No proptosis or lid lag.  Oropharynx is clear.  NECK: Supple. No thyromegaly. No neck tenderness. No palpable thyroid nodules.  CARDIAC: Regular rate and rhythm. No audible murmur.  PULMONARY: Clear to auscultation bilaterally, decreased breath, sounds good inspiratory effort.  ABDOMEN: Diffusely soft, nontender, nondistended.  EXTREMITIES: No edema is present. Full range of motion of all extremities.  SKIN: No rash or dermatopathy is present. No alopecia is present.  PSYCHIATRIC: The patient is lethargic but arousable. She is alert to person only, not place or time. Calm and cooperative.   LABORATORY DATA: Glucose 114, BUN 20, creatinine 0.82, sodium 142, potassium 3.9, CO2 32, calcium 9.1, AST 40, ALT 28. TSH 17.10, free T4 of 0.83. Troponin I 0.12. Urinalysis negative. WBC 14, hematocrit 41.9, platelets 282, neutrophils 82% lymphocytes, 10%. Blood cultures negative x 2.   ASSESSMENT: This is a 69 year old female admitted from assisted living facility with lethargy, possibly induced by some sort of medication, as well as concern for chronic obstructive pulmonary disease exacerbation. An incidental finding of elevated TSH level. Elevated TSH is consistent with hypothyroidism, however, it has is not clear at this is clinically playing a role in her current medical condition. She may have primary hypothyroidism or due to acute illness, may have had thyroiditis and is in the second phase in which there is  hypothyroidism followed by a euthyroid state.   RECOMMENDATIONS:  I do not recommend starting levothyroxine at this time, as it could increase her metabolic rate, which she may not be able to tolerate given her acute illness. I would prefer to repeat a TSH level in 7 to 10 days once her clinical condition improves and then make a judgment on whether low dose  levothyroxine if indicated.   Thank you for the kind request for consultation   ____________________________ A. Lavone Orn, MD ams:cc D: 09/05/2012 21:54:28 ET T: 09/05/2012 22:53:05 ET JOB#: [id number]  cc: A. Lavone Orn, MD, <Dictator> Sherlon Handing MD ELECTRONICALLY SIGNED 09/11/2012 14:16

## 2014-06-13 NOTE — Consult Note (Signed)
PATIENT NAME:  Traci Holmes, STRAND MR#:  [id number] DATE OF BIRTH:  21-Mar-1945  AGE:  69 years  SEX:  Female  RACE:  White  DATE OF CONSULTATION:  08/05/2012  PLACE OF DICTATION:  [facility], [place] [id number]  CONSULTING PHYSICIAN:  Josearmando Kuhnert K. Sonny Anthes, MD  SUBJECTIVE:  The patient is a 69 year old white female, not employed, and last worked many years ago. Widowed for many years, and has a mentally retarded son who lives with her. The patient reports that she does have a cat and a house, but was upset that it was mentioned about feces of cat all over the house. The patient was also upset when she was asked about moldy food in the refrigerator. When she was asked who was taking care of her mentally retarded son now, she turned back upset and said, "Who is taking care of your children?"  The patient was seen in consultation. According to information obtained from the charts, DSS workup on patient at home, talking to the aide, and lives in a [place] home with a mentally retarded son. DSS reported animal feces all over the house and only moldy food in the refrigerator. The patient was brought on [facility] for psych evaluation. When patient was seen in consultation, she was upset and irritable that she is being seen by a psychiatrist.   CHIEF COMPLAINT:  "I came here because I was feeling weak."  PAST PSYCHIATRIC HISTORY:  No previous history of inpatient psychiatry. No history of suicide attempts. Not being followed by any psychiatrist.   ALCOHOL AND DRUGS:  Denied.  MENTAL STATUS:  The patient is alert and oriented. The patient is dressed in hospital clothes. Was coughing and was having oxygen. Initially calm, pleasant and cooperative, but later as interview went on, she was upset and irritable about the questions asked. She said "Let me go home."  Denies feeling depressed, denies feeling hopeless or helpless, but is irritable and probably depressed. Denies feeling any worthlessness, and denies  suicidal/homicidal plans. No psychosis. Denies any auditory or visual hallucinations. She said that she was trying to just ventilate her feelings and talking to self, but absolutely denies any internal stimuli or having any thoughts inside her head. Judgment is intact for Friday. She said she would leave. She can count money.  IMPRESSION:  depression secondary to medical illness - uterine cancer.  RECOMMENDATIONS:  Can start patient on a low dose of antidepressant, like Celexa 20 mg, to help her with her mood and depression. DSS and social services are to help her out regarding her living situation and if she is in need of any help, to take care of her son and herself with home health care.  Thanks for the consultation.   ____________________________ Wallace Cullens. Franchot Mimes, MD skc:mr D: 08/05/2012 13:27:57 ET T: 08/05/2012 22:00:48 ET JOB#: [id number]  cc: Arlyn Leak K. Franchot Mimes, MD, <Dictator> Dewain Penning MD ELECTRONICALLY SIGNED 08/11/2012 18:16

## 2014-06-13 NOTE — Consult Note (Signed)
PATIENT NAME:  Traci Holmes, Traci Holmes MR#:  [id number] DATE OF BIRTH:  1945-12-25  DATE OF CONSULTATION:  09/05/2012  REFERRING PHYSICIAN:  Dr. Manuella Ghazi CONSULTING PHYSICIAN:  Corky Sox. Zettie Pho, PA-C ATTENDING GASTROENTEROLOGIST:  Lollie Sails, MD  REASON FOR CONSULTATION: Abdominal pain/nausea.   HISTORY OF PRESENT ILLNESS: This is a 69 year old female who was initially brought in to the hospital by EMS, who had mentioned she was having concerns of nausea. Upon further work-up, she was found to be quite lethargic but was denying any chest pain or shortness of breath. She did have an elevated troponin of 0.12; however, EKG had no acute ST wave changes. She also has leukocytosis and blood cultures and urine cultures are currently pending. She was in respiratory distress and was initially started on BiPAP as well. She does have some degree of dementia and is a relatively poor historian and is difficult to get an accurate history from. When speaking with the patient currently, she is denying any abdominal pain or nausea and states that she cannot remember if she was having nausea or abdominal pain when she came in. Of note, LFTs are within normal limits. Urine drug screen was negative. Lipase was within normal limits. However, a CT of the chest did show small cysts in the pancreatic tail, noting that a cystic tumor could not be excluded. A pulmonary embolism was ruled out. Remainder of the history, like I said, is unobtainable due to the patient's mental status.   ALLERGIES: No known drug allergies.   PAST MEDICAL HISTORY: Uterine cancer, depression, dementia and COPD.   PAST SURGICAL HISTORY: Hysterectomy.   FAMILY HISTORY: Unobtainable.   SOCIAL HISTORY: The patient smokes 1 pack per day for several years. She lives in an assisted living facility.   HOME MEDICATIONS: Ensure, donepezil and Tylenol.   REVIEW OF SYSTEMS: All pertinent parts of the history that I was able to obtain from the patient were  mentioned in the HPI. However, the remainder is extremely limited due to the patient's dementia and current mental status.   OBJECTIVE: VITAL SIGNS: Blood pressure 125/41, respirations 14. Heart rate is 80. Pulse oximetry is 100%.. Currently.  GENERAL: This is a pleasant 69 year old female resting quietly and comfortably in the exam room, in no acute distress. Alert and oriented x 3.  HEAD: Atraumatic, normocephalic.  NECK: Supple. No lymphadenopathy noted.  HEENT: Sclerae anicteric. Mucous membranes moist.  LUNGS: Respirations are even and unlabored.  HEART: Regular rate and rhythm. S1, S2 noted.  ABDOMEN: Soft, nontender, nondistended. Normoactive bowel sounds noted in all 4 quadrants. No masses, hernias or organomegaly was appreciated.  PSYCHIATRIC: Appropriate mood and affect. It does seem that the patient has a poor memory.  RECTAL: Deferred.   LABORATORY AND DIAGNOSTIC DATA: White blood cells 14, hemoglobin 13.8, hematocrit 41.9, platelets 282. Sodium 142, potassium 3.9, BUN 20, creatinine 0.82, glucose 114, MCV 101. Lipase 139, bilirubin 0.3, alkaline phosphatase 82, ALT 28, AST 40, albumin 4.1. EKG has no acute ST wave changes. Urine drug screen negative. Urinalysis negative. Blood culture and urine culture are pending. Troponin 0.12. TSH 17.10.   IMAGING: CT of the head was obtained on the patient with no acute findings.   CT angiogram was obtained on the patient and a pulmonary embolism was ruled out.   CT of the chest was obtained on the patient showing no evidence of a pulmonary embolism. However, there was small cyst in the pancreatic tail, possibly reflecting a benign pseudocyst. However, a cystic  tumor could not be excluded and CT of the pelvis was suggested. Hepatic and renal cysts were also observed.   ASSESSMENT: 1.  Nausea and abdominal pain. According to the patient, she states that she is not experiencing these symptoms currently but per medical records, that is the reason  why she was brought into the hospital by [facility].  2.  Respiratory distress, acute respiratory acidosis.  3.  Leukocytosis.  4.  Chronic obstructive pulmonary disease exacerbation.  5.  Elevated troponin that is non-ST elevation.  6.  Markedly elevated TSH level of 17.10.  7.  Abnormal CT scan showing small cysts in the pancreatic tail of unclear significance.   PLAN: I have discussed this patient's case in detail with Dr. Loistine Simas who is involved in the development of the patient's plan of care. Although the patient is a poor historian and is currently denying any nausea, this did appear to be her chief complaint upon initial presentation and therefore, we do recommend scheduling her Zofran 4 mg q.6 hours as opposed to keeping her on a p.r.n. regimen. Currently she is denying any abdominal pain. However, there were some abnormalities noted in the pancreatic tail on CT scan. Lipase is within normal limits and therefore, we do recommend looking into this further once her cardiopulmonary status has been stabilized. Also, she did have a markedly elevated TSH level, which could certainly be contributing to her nausea as well. Endocrine consult is currently pending. We will continue to monitor this patient throughout hospitalization, await urine and blood cultures, and make further recommendations pending above and per clinical course.   Thank you so much for this consultation and for allowing [place] to participate in the patient's plan of care.   ____________________________ Corky Sox. Ramon Zanders, PA-C kme:jm D: 09/05/2012 14:31:39 ET T: 09/05/2012 15:05:25 ET JOB#: [id number]  cc: Corky Sox. Murrell Dome, PA-C, <Dictator> [place] PA ELECTRONICALLY SIGNED 09/10/2012 13:41

## 2014-06-13 NOTE — H&P (Signed)
PATIENT NAME:  Traci Holmes, Traci Holmes MR#:  [id number] DATE OF BIRTH:  1945/04/11  DATE OF ADMISSION:  07/19/2012  REASON FOR ADMISSION:  The patient found down with altered mental status, on the house there is feces, floors are full of cat excrement and possibly even human feces, very dirty and without any food in her refrigerator.   REFERRING PHYSICIAN:  Dr. Marjean Donna.   PRIMARY CARE PHYSICIAN:  none  HISTORY OF PRESENT ILLNESS:  Traci Holmes is a 69 year old female who has history of headaches and uterine cancer status post hysterectomy in the past.  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  Other than that she does not have any other income.  Today, the patient was found down after one of the neighbors went to check on her trying to ask for a cigarette.  They called the police.  The police came into the house.  She was on the floor lying down, did not know what was going on, did not know what happened.  She does not remember anything at this moment, but the story brought by the police is that she was covered on fecal matter from a cat, multiple cats, and also possible human.  She lives on poor living conditions.  Feces and urine were on the floor in the house.  The social services were notified.  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  She was found to be on rhabdomyolysis.  She does not have any muscle mass.  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    REVIEW OF SYSTEMS:  Unable to obtain a review of systems and the patient is still confused.  She states that she is not hurting anywhere, but she is confabulating and not answering the questions directly.  I had a long conversation with the patient's sister who  is there and she is concerned about her situation.  The patient is awake.  She is talking, but again confused.  Overall, questions that I was able to ask she denies any fever, but she is really does not even know where she is right now.  Denies any blurry vision at this moment.  No tinnitus.  No difficulty swallowing.  She is eating.  Denies any cough or wheezing.  She smokes 1 pack a day.  She has never been diagnosed with COPD.  CARDIOVASCULAR:  Denies any chest pain or palpitations at this moment.  GASTROINTESTINAL:  Denies any nausea or vomiting.  GENITOURINARY:  Denies any dysuria or changes in frequency.  GYNECOLOGIC:  The patient had a hysterectomy for uterine cancer.  ENDOCRINOLOGY:  No polyuria, polydipsia, polyphagia, cold or heat intolerance.   HEMATOLOGIC AND LYMPHATIC:  Denies any hematologic problems or swollen glands.  SKIN:  The patient denies any skin conditions or rashes.  MUSCULOSKELETAL:  The patient denies any pain on neck, back, hips at this moment.  No gout.  NEUROLOGIC:  No numbness or weakness.  The patient has chronic headaches.  PSYCHIATRIC:  The patient states that she was depressed after her husband passed 2 or 3 years ago, but right now only has her son.  She denies any anxiety.   PAST MEDICAL HISTORY:  Headaches  and uterine cancer.   ALLERGIES:  Not known drug allergies.   PAST SURGICAL HISTORY:  Hysterectomy and plastic surgery in the past.   MEDICATIONS:  BC powders.   FAMILY HISTORY:  Positive CVAs and cancer runs in her family.  She cannot specify who.  She says just parents and grandparents had them.   SOCIAL HISTORY:  The patient smokes 1 pack a day.  She has been smoking for over 50 years.  Alcohol, apparently the police and neighbors said that she actually drinks almost every day.  The patient states that she drinks occasionally so I am not quite sure if this is true.  She lives with her son who has mental dyscapacity and she collects money from [employer].  She does not have any other income.  There was no food on the refrigerator other than eggs from expiration date of January.  She does not remember what happened.  She says that she does not even remember going to bed last night or the night before, so we do not know how long was she down.   PHYSICAL EXAMINATION: VITAL SIGNS:  Blood pressure 138/60, pulse of 61, respirations 18, temperature 98.3, oxygen saturation 93% on room air.  GENERAL:  The patient looks chronically ill, severely malnourished with no muscle mass, but at this moment she is hemodynamically stable.  HEENT:  Pupils are equal and reactive.  Extraocular movements are intact.  Mucosa are dry.  Anicteric sclerae.  Pink conjunctivae.  No oral lesions.  No oropharyngeal exudates.  Head normocephalic, atraumatic.  No open wounds.  Nose, no lesions.  No drainage.  Ears, the patient has some seborrheic dermatitis behind the ears, but not infected, mild.  No other lesions or exudates.  NECK:  Supple.  No JVD.  No thyromegaly.  No adenopathy.  No carotid bruits.  No masses.  Trachea is central.  Full range of motion.  No rigidity.  RESPIRATORY:  Good respiratory effort.  Clear to auscultation and percussion.  Symmetric rise.  No wheezing.  CARDIOVASCULAR:  Regular rate and rhythm.  No murmurs, rubs or gallops are appreciated.  No displacement of PMI.  No tenderness to palpation anterior chest wall.  GASTROINTESTINAL:  Bowel sounds are positive.  Nontender, nondistended.  No masses.  No hepatosplenomegaly.  No rebound.  GENITOURINARY:  Deferred.   EXTREMITIES:  No cyanosis, clubbing or edema.  MUSCULOSKELETAL:  Normal back without any tenderness to palpation of spinal processes.  No deformity of joints.  No joint effusions.  SKIN:  No rashes or petechiae at this moment.  No open wounds.  No diaphoresis.  LYMPHATICS:  Negative for lymphadenopathy in neck or supraclavicular areas.  VASCULAR:  Good pulses, good capillary refill.   NEUROLOGIC:  Cranial nerves II through XII seem intact.  The patient is confused and she tries to hide this by making occasional jokes.  She does not know the date.  She does not know the month.  She knew the year, but she did not know who the president was.  She knows that she is in a hospital, but she does not know what the name of the hospital is despite the fact that she lives here in town in [place].  PSYCHIATRIC:  She is cooperative, not agitated.  The patient is oriented to person, but not on time or place.   LABORATORY, DIAGNOSTIC AND RADIOLOGICAL DATA:  Glucose 92, BUN 58, chloride 110, sodium 143, potassium 3.5.  Alcohol level is negative.  Total  protein 6.9, bilirubin 0.4, AST elevated at 41, CK 441, CK-MB 10.  Troponin is negative at 0.5.  White count is 7.3, hemoglobin 12.7, platelet count 187.  MCV is elevated at 101.  UA has positive blood +2, but  negative red blood cells and white count is 16, negative nitrites, negative leukocyte esterase.   ASSESSMENT AND PLAN:  A 69 year old female with history of uterine cancer in the past, status post hysterectomy, headaches.  She was found down on her house full of cat feces, possible even human feces and urine.  Unknown time of patient being found down.  Family is concerned about her living situation.  1.  Altered mental status.  The patient still has confusion, unknown cause.  Consider urine infection as the patient has 16 white blood cells.  I am not going to start her on any antibiotics as the patient is non-tachycardic, afebrile.  We are going to do urine cultures before starting any antibiotics.  Other possibilities of this altered mental status and patient being found down is cardiogenic.  We are going to keep her under telemetry monitoring and get an echocardiogram.  Does not seem like the patient had a stroke as her neurologic exam is mostly intact peripherally other than her confusion.  Defer MRI if the patient's explanation of her altered  mental status does not come to light in the next 24 hours.  Ultrasound of the carotids.  At this moment I do not believe it is necessary.  The patient does not have any carotid bruits.  Let us just start with echocardiogram first.  2.  Placement is going to be a big issue as the patient has sub-human living conditions.  No food in the house.  3.  Smoker.  The patient does not have a diagnosis of chronic obstructive pulmonary disease or take any inhalers.  Smoking cessation given to the patient for three minutes, especially on the sense that the patient is not buying any food, but she has enough money to buy cigarettes.   4.  Rhabdomyolysis.  The patient has +2 blood without red blood cells, which is an indication of myoglobinuria.  Put the patient on IV fluids at 125 an hour as the patient is little and could get fluid overload.  Monitor CK in the morning.  Monitor kidney function.   5.  Dehydration, signs of dehydration with hyperchloridemia and increased BUN of 58.  Monitor kidney function as the patient had a creatinine of 0.87, and her muscle mass is very little.  Her weight is only 49 pounds, for what she is actually very likely on beginnings of acute kidney injury as her GFR will be somewhere around 40.   6.  Elevated AST, likely the patient is a drinker, although she is denying it.  We are going to put her on CIWA protocol.  7.  Elevated MCV.  Get vitamin B12 levels and folate acid levels.   8.  The patient is a FULL CODE.  9.  Deep vein thrombosis prophylaxis with heparin.  10.  Gastrointestinal prophylaxis with Protonix.   TIME SPENT:  I spent about 45 minutes with this patient.    ____________________________ Wadley Sink, MD rsg:ea D: 07/19/2012 22:09:00 ET T: 07/19/2012 23:01:31 ET JOB#: [id number]  cc: Cadiz Sink, MD, <Dictator> Fredy Gladu America Brown MD ELECTRONICALLY SIGNED 07/30/2012 1:48

## 2014-06-13 NOTE — Discharge Summary (Signed)
PATIENT NAME:  Traci Holmes, MCMURRY MR#:  [id number] DATE OF BIRTH:  08-31-1945  DATE OF ADMISSION:  08/04/2012 DATE OF DISCHARGE:  08/13/2012  Addendum   Addendum to the interim discharge summary dictated by Dr. Laurin Coder on 08/12/2012.  HOSPITAL COURSE: The patient did well during her stay since the interim discharge summary dictated. Did not have any episodes of agitation. The patient had her bed available at assisted living facility, and with coordination of the social worker, the patient was transferred there.   DISCHARGE MEDICATIONS: Included:  1. Acetaminophen 650 mg oral every 6 hours as needed for pain or fever.  2. Zofran 4 mg oral every 6 hours as needed for nausea and vomiting.  3. Alprazolam 0.25 mg oral every 8 hours as needed for anxiety.  4. Donepezil 5 mg oral once a day.  5. Augmentin 875 mg oral 2 times a day for 3 days.  6. Ensure 240 mL orally once a day.   DISCHARGE INSTRUCTIONS: The patient was discharged home on a regular diet with activity as tolerated with assistance. The patient was set up for outpatient physical therapy. Follow up with primary care physician in a week and psychiatry in a week.   TIME SPENT ON DAY OF DISCHARGE IN DISCHARGE ACTIVITY: 39 minutes.   ____________________________ Leia Alf Zhyon Antenucci, MD srs:OSi D: 08/14/2012 09:55:28 ET T: 08/14/2012 11:26:16 ET JOB#: [id number]  cc: Alveta Heimlich R. Robyn Galati, MD, <Dictator> Neita Carp MD ELECTRONICALLY SIGNED 08/15/2012 14:22

## 2014-06-13 NOTE — Consult Note (Signed)
Chief Complaint:  Subjective/Chief Complaint seen for abdominal pain and nausea.  Patient sleepy, awakens to voice and during exam.  denies abdominal pain or nausea.   VITAL SIGNS/ANCILLARY NOTES: **Vital Signs.:   19-Jul-14 12:04  Vital Signs Type Routine  Temperature Temperature (F) 98.3  Celsius 36.8  Temperature Source oral  Pulse Pulse 50  Pulse source if not from Vital Sign Device per cardiac monitor  Respirations Respirations 18  Systolic BP Systolic BP 938  Diastolic BP (mmHg) Diastolic BP (mmHg) 41  Mean BP 65  Pulse Ox % Pulse Ox % 98  Pulse Ox Activity Level  At rest  Oxygen Delivery 2L; Nasal Cannula  Pulse Ox Heart Rate 50   Brief Assessment:  Cardiac Regular   Respiratory clear BS   Gastrointestinal details normal Soft  Nontender  Nondistended  No masses palpable  Bowel sounds normal   Lab Results: Hepatic:  17-Jul-14 07:08   SGPT (ALT) 30  19-Jul-14 03:38   Albumin, Serum  2.6 (Result(s) reported on 08 Sep 2012 at 04:33AM.)  TDMs:  19-Jul-14 03:38   Dilantin, Serum 19.1 (Result(s) reported on 08 Sep 2012 at 04:35AM.)  Oncology:  17-Jul-14 14:52   Carbohydrate Antigen 19-9  78 (Roche ECLIA methodology            [facility]            No: [id number]           [address] [place], [place] [address]           Lindon Romp, MD         [phone] Result(s) reported on 08 Sep 2012 at 05:48PM.)  Routine Chem:  18-Jul-14 06:37   B-Type Natriuretic Peptide [facility])  2508 (Result(s) reported on 07 Sep 2012 at 07:12AM.)  19-Jul-14 03:38   Magnesium, Serum 1.8 (1.8-2.4 THERAPEUTIC RANGE: 4-7 mg/dL TOXIC: > 10 mg/dL  -----------------------)  Glucose, Serum 94  BUN  5  Creatinine (comp)  0.54  Creatinine (comp) -  Sodium, Serum  133  Potassium, Serum 3.5  Chloride, Serum 100  CO2, Serum 26  Calcium (Total), Serum  7.7  Anion Gap 7  Osmolality (calc) 263  eGFR (African American) >60  eGFR (African American) -  eGFR (Non-African  American) >60 (eGFR values <66mL/min/1.73 m2 may be an indication of chronic kidney disease (CKD). Calculated eGFR is useful in patients with stable renal function. The eGFR calculation will not be reliable in acutely ill patients when serum creatinine is changing rapidly. It is not useful in  patients on dialysis. The eGFR calculation may not be applicable to patients at the low and high extremes of body sizes, pregnant women, and vegetarians.)  eGFR (Non-African American) - (eGFR values <42mL/min/1.73 m2 may be an indication of chronic kidney disease (CKD). Calculated eGFR is useful in patients with stable renal function. The eGFR calculation will not be reliable in acutely ill patients when serum creatinine is changing rapidly. It is not useful in  patients on dialysis. The eGFR calculation may not be applicable to patients at the low and high extremes of body sizes, pregnant women, and vegetarians.)  Result Comment CREATININE - DUPLICATE ORDER SEE ACCESSION [id number]  Result(s) reported on 08 Sep 2012 at 07:48AM.  Routine Hem:  19-Jul-14 03:38   WBC (CBC) 8.1  RBC (CBC)  3.30  Hemoglobin (CBC)  11.1  Hematocrit (CBC)  32.3  Platelet Count (CBC) 182  MCV 98  MCH 33.5  MCHC 34.2  RDW 13.1  Neutrophil % 72.0  Lymphocyte % 14.1  Monocyte % 13.2  Eosinophil % 0.1  Basophil % 0.6  Neutrophil # 5.8  Lymphocyte # 1.1  Monocyte #  1.1  Eosinophil # 0.0  Basophil # 0.1 (Result(s) reported on 08 Sep 2012 at 07:53AM.)   Radiology Results: CT:    16-Jul-14 04:30, CT Chest for Pulm Embolism With Contrast  CT Chest for Pulm Embolism With Contrast   REASON FOR EXAM:    hypoxic cxr not bad  COMMENTS:       PROCEDURE: CT  - CT CHEST (FOR PE) W  - Sep 05 2012  4:30AM     RESULT: History: Hypoxia.    Comparison Study: Chest x-ray of 09/05/2012.    Fines: Standard CT obtained 75 cc of Isovue-370. Thoracic aorta normal   caliber. Pulmonary arteries are normal. Hepatic cyst. No focal  adrenal   abnormality. Renal cysts. Nonspecific bilateral perirenal streaking   present. Small cystic lesions noted in the tail. Cystic pancreatic tumor   cannot be excluded. CT of the abdomen and pelvis suggested for further   evaluation. Atelectasis in lung bases. 3 mm nodule versus atelectasis     right lung base. Followup chest CT in 6 months suggested for further   evaluation.    IMPRESSION:   1. No pulmonary embolus.  2.  3 mm nodular density versus atelectasis in the right lung base.   Atelectasis present in both bases. Followup chest CT in 6 months   suggested to demonstrate stability.  3. Small cysts noted in the tail of the pancreas , these changes could be   benign and related to pancreatic pseudocysts, however a cystic pancreatic   tumor cannot be excluded and CT of the pelvis is suggested for further   evaluation.  4. Hepatic and renal cysts.      Verified By: Osa Craver, M.D., MD   Assessment/Plan:  Assessment/Plan:  Assessment 1) nausea and abdominal pain, resolved 2) abnormal CT  with possible pancreatic tail cystic lesion.  mild elevation of CA 19-9 at 78 (upper scale 35) 3) seizure d/o-MRI not possible today due to patient agitation and movement.   Plan 1) CT abdomen with pancreatic protocol when clinically feasible. will follow at a distance.   Electronic Signatures: Loistine Simas (MD)  (Signed 19-Jul-14 19:01)  Authored: Chief Complaint, VITAL SIGNS/ANCILLARY NOTES, Brief Assessment, Lab Results, Radiology Results, Assessment/Plan   Last Updated: 19-Jul-14 19:01 by Loistine Simas (MD)

## 2014-06-13 NOTE — H&P (Signed)
PATIENT NAME:  Traci Holmes, Traci Holmes MR#:  [id number] DATE OF BIRTH:  1945/04/26  DATE OF ADMISSION:  09/05/2012  PRIMARY CARE PHYSICIAN: Dr. Brynda Greathouse   REFERRING PHYSICIAN: Dr. Conni Slipper.   CHIEF COMPLAINT: Nausea.   The patient is a 69 year old Caucasian female with a past medical history of uterine cancer, depression, dementia according to the old medical records, was brought into the ER via EMS for persistent nausea. The patient told that she started taking a medicine last night and then she started having persistent nausea, with no emesis. The patient was having chills, but no fever. Recently the patient was admitted to the hospital and she was sent over to assisted living.   CAT scan of the head was done in the ER for the patient being lethargic, and no acute findings were noted. Eventually a urine drug screen was negative. Blood cultures and urine cultures were done, and the patient being with leukocytosis she was given antibiotics, azithromycin and Zosyn in the ER. One dose of Narcan was given in the ER and Haldol.   ABGs have revealed acute respiratory acidosis, with a PH of 7.25, pCO2 of 69, and a pO2 44. Troponin is elevated at 0.12 and TSH is elevated at 17.10. The patient being hypoxemic, a CT angiogram of the chest was done, and pulmonary embolism was ruled out.   An  EKG has revealed no acute ST-T wave changes. Hospitalist team was called to admit the patient after she was placed on 40% BiPAP.   During my examination, the patient denies any chest pain or shortness of breath. After she had received Haldol she was quite lethargic, and I was unable to get any history from her. No family members or friends are available at the bedside. Urine drug screen is negative.   PAST MEDICAL HISTORY: History of uterine cancer according to old medical records, depression, dementia.   PAST SURGICAL HISTORY: Hysterectomy, and plastic surgery in the past.   ALLERGIES: No known drug allergies.    Currently living in assisted living. Smokes 1 pack a day. No history of alcohol or illicit drug use.   FAMILY HISTORY: According to old records, CVAs and cancers run in her family.   REVIEW OF SYSTEMS: Unobtainable. The patient is lethargic and on BiPAP machine.   HOME MEDICATIONS: Ensure, daily, donepezil 5 mg once daily, acetaminophen 325 mg  2 tablets p.o. q.6 hours.   Review of systems unobtainable as the patient is lethargic and on BiPAP machine.   PHYSICAL EXAMINATION: VITAL SIGNS: Temperature 97.6, pulse 69, respirations 16, blood pressure is 118/57, pulse oximetry is 100% on BiPAP.  GENERAL APPEARANCE: Not in acute distress; resting comfortably on BiPAP. Moderately-built and moderately-nourished.  HEENT: Normocephalic, atraumatic. Pupils are equally reactive to light and accommodation. No scleral icterus. No conjunctival injection. No sinus tenderness.  NECK: Is supple. No JVD. There is no thyromegaly. No lymphadenopathy.  LUNGS: Distant breath sounds. Moderate air entry. End-expiratory wheezing is present. No crackles. No rales. No rhonchi.  CARDIAC: S1, S2 normal. Regular rate and rhythm. Bradycardic. No anterior chest wall tenderness on palpation. No edema.  GASTROINTESTINAL: Soft. Bowel sounds are positive in all 4 quadrants. Nontender, nondistended. No masses felt. No hepatosplenomegaly.  NEUROLOGIC: Arousable, oriented x 1 but lethargic. Unable to get more examination.  Motor and sensory: Reflexes are 2+.  EXTREMITIES: No edema. No cyanosis. No clubbing.  SKIN: Warm to touch. Normal turgor. No rashes. No lesions.  MUSCULOSKELETAL: No joint effusion. No tenderness or erythema.  LABS AND IMAGING STUDIES: A 12-lead EKG has revealed sinus bradycardia at 52 beats per minute, normal P-R and QRS interval, normal Q-T and corrected Q-T intervals. No acute ST-T wave changes.     CAT scan of the head: CT head was done with the patient being lethargic, which has revealed no acute  findings.   CT angiogram of the chest has revealed no evidence of pulmonary embolism. No acute intra-thoracic findings. Chronic findings. Nonspecific perinephric fat-stranding in the visualized upper abdomen; incidental cystic lesions in the pancreatic pain. Comparison with upright abdominal remaining recommended is not available.   OTHER LABS: Urine drug screen is negative.   Troponin 0.12, TSH is 17.16, WBC 14.0, hemoglobin 13.8, hematocrit 41.9, platelets 282, MCV 101, neutrophils 82.5. Urinalysis clear in color, glucose negative, bilirubin negative, ketones negative, specific gravity 1.011. Nitrite and leukocyte esterase are negative.   PT 7.24, pCO2 59, pO2 44, FiO2 21%. Bicarb is 30.3.   LFTs are within normal range except AST is elevated at 40, glucose 114, BUN 20, creatinine 0.82. Rest of the BMP is normal except anion gap, which is low at 5.   ASSESSMENT AND PLAN: A 69 year old female brought into the Emergency Room from assisted living with persistent nausea after she tried some new medicine. Will be admitted with the following assessment and plan:   1.  Non-ST elevation myocardial infarction, with hypoxia: Will admit her to critical care unit. Cycle cardiac biomarkers. Acute coronary syndrome protocol with oxygen, nitroglycerin, aspirin and statin. The patient will not be placed on a beta blocker as she is bradycardic. Lovenox 1 mg/kg subcutaneous q.12 hours. Cardiology consult is placed to on-call cardiologist, Dr. Neoma Laming. The patient had a recent echocardiogram done in May which has revealed an ejection fraction of 65%.  2.  Acute respiratory acidosis, with hypoxia: Probably from acute exacerbation of chronic obstructive pulmonary disease. Solu-Medrol 120 mg IV x 1 was given in the ER. Will provide Solu-Medrol 60 mg intravenous q.6 hours, and nebulizer treatments. She will be on Rocephin and Zithromax, and continue BiPAP.  3.  Leukocytosis: This can be from sepsis or inflammation.  Pan-cultures were obtained, and the patient will be started on empiric antibiotics and will continue close monitoring of  labs.  4.  Elevated TSH: Will check free T4 and T3, and further management based on the T4 and T3 results.  5.  History of dementia, depression and uterine cancer.  6.  Will provide her gastrointestinal prophylaxis with Protonix, and deep vein thrombosis prophylaxis. The patient is on Lovenox 1 mg/kg subcutaneous.   Total time spent: Sixty minutes. No family members are available at bedside.    ____________________________ Nicholes Mango, MD ag:dm D: 09/05/2012 07:20:45 ET T: 09/05/2012 08:47:10 ET JOB#: [id number]  cc: Nicholes Mango, MD, <Dictator> Dionisio David, MD Nicholes Mango MD ELECTRONICALLY SIGNED 09/16/2012 6:41

## 2014-06-13 NOTE — Consult Note (Signed)
Chief Complaint:  Subjective/Chief Complaint seen for nausea and abdominal pain.. patient denies both, no n/v or abd pain.   VITAL SIGNS/ANCILLARY NOTES: **Vital Signs.:   17-Jul-14 11:07  Vital Signs Type Upon Transfer  Temperature Temperature (F) 99.6  Celsius 37.5  Temperature Source oral  Pulse Pulse 75  Respirations Respirations 16  Systolic BP Systolic BP 254  Diastolic BP (mmHg) Diastolic BP (mmHg) 56  Mean BP 77  Pulse Ox % Pulse Ox % 96  Pulse Ox Activity Level  At rest  Oxygen Delivery Room Air/ 21 %   Brief Assessment:  Cardiac Irregular   Respiratory clear BS   Gastrointestinal details normal Soft  Nontender  Nondistended  Bowel sounds normal   Lab Results: Hepatic:  17-Jul-14 07:08   Bilirubin, Total 0.4  Alkaline Phosphatase 73  SGPT (ALT) 30  SGOT (AST) 27  Total Protein, Serum  6.3  Albumin, Serum  3.2  Routine Chem:  17-Jul-14 07:08   Glucose, Serum  157  BUN 9  Creatinine (comp) 0.69  Sodium, Serum  133  Potassium, Serum 4.7  Chloride, Serum 99  CO2, Serum 26  Calcium (Total), Serum 8.9  Osmolality (calc) 268  eGFR (African American) >60  eGFR (Non-African American) >60 (eGFR values <39m/min/1.73 m2 may be an indication of chronic kidney disease (CKD). Calculated eGFR is useful in patients with stable renal function. The eGFR calculation will not be reliable in acutely ill patients when serum creatinine is changing rapidly. It is not useful in  patients on dialysis. The eGFR calculation may not be applicable to patients at the low and high extremes of body sizes, pregnant women, and vegetarians.)  Anion Gap 8  Magnesium, Serum  1.3 (1.8-2.4 THERAPEUTIC RANGE: 4-7 mg/dL TOXIC: > 10 mg/dL  -----------------------)  Cholesterol, Serum  203  Triglycerides, Serum 67  HDL (INHOUSE)  71  VLDL Cholesterol Calculated 13  LDL Cholesterol Calculated  119 (Result(s) reported on 06 Sep 2012 at 07:44AM.)  Cardiac:  17-Jul-14 07:08   CPK-MB,  Serum 1.3 (Result(s) reported on 06 Sep 2012 at 07:50AM.)  Troponin I 0.05 (0.00-0.05 0.05 ng/mL or less: NEGATIVE  Repeat testing in 3-6 hrs  if clinically indicated. >0.05 ng/mL: POTENTIAL  MYOCARDIAL INJURY. Repeat  testing in 3-6 hrs if  clinically indicated. NOTE: An increase or decrease  of 30% or more on serial  testing suggests a  clinically important change)  Routine Hem:  17-Jul-14 07:08   WBC (CBC)  12.3  RBC (CBC)  3.57  Hemoglobin (CBC)  11.8  Hematocrit (CBC) 35.5  Platelet Count (CBC) 243  MCV 100  MCH 33.2  MCHC 33.3  RDW 13.7  Neutrophil % 92.6  Lymphocyte % 5.6  Monocyte % 1.6  Eosinophil % 0.0  Basophil % 0.2  Neutrophil #  11.4  Lymphocyte #  0.7  Monocyte # 0.2  Eosinophil # 0.0  Basophil # 0.0 (Result(s) reported on 06 Sep 2012 at 07:36AM.)   Assessment/Plan:  Assessment/Plan:  Assessment 1) nausea/abdominal pain-resolved 2) abnormal CT with cystic lesions in the tail of the pancreas.   Plan 1) CT scan with pancreatic protocol 2) will check ca19-9. 3) symptomatic tx of nausea as needed.   Electronic Signatures: SLoistine Simas(MD)  (Signed 17-Jul-14 14:16)  Authored: Chief Complaint, VITAL SIGNS/ANCILLARY NOTES, Brief Assessment, Lab Results, Assessment/Plan   Last Updated: 17-Jul-14 14:16 by SLoistine Simas(MD)

## 2014-06-13 NOTE — Consult Note (Signed)
Brief Consult Note: Diagnosis: Abdominal pain/nausea, respiratory distress, possible NSTEMI, elevated TSH.   Patient was seen by consultant.   Consult note dictated.   Discussed with Attending MD.   Comments: Patient seen and evaluated. Hx of dementia with recent altered mental status, difficult to obtain a history from. She intitially presented via EMS with complaints of nausea, but also mentioned she was having some abdominal pain. Further qorkup reealed and elevated troponin, EKG negative for ST wave changes. Also respiratory distress. Markedly elevated TSH at 17.10. Lipase WNL but CT chest did mention small pancreatic tail cysts of unclear significance. Blood culture and Urine culture still pending.  Spoke with RN, Larene Beach, who states pt has not been complaining of abdominal pain. No emesis. Recc scheudling Zofran '4mg'$  q 6 hrs, to be given routinely (not PRN). Recc stabilizing the patients cardiopulmonary status prior to investgating pancreatic findings on CT. will follow along with you. Full consult being dictated.  Electronic Signatures: Loren Racer M (PA-C)  (Signed 16-Jul-14 14:10)  Authored: Brief Consult Note   Last Updated: 16-Jul-14 14:10 by Sherald Barge (PA-C)

## 2014-06-13 NOTE — Consult Note (Signed)
Holmes NAME:  Traci Holmes, Traci Holmes MR#:  [id number] DATE OF BIRTH:  1945/12/17  DATE OF CONSULTATION:  09/07/2012  REFERRING PHYSICIANS:  Vipul S. Manuella Ghazi, MD, and Albertine Patricia, MD CONSULTING PHYSICIAN:  Clydene Burack K. Manuella Ghazi, MD PRIMARY CARE PHYSICIAN: Mikeal Hawthorne. Brynda Greathouse, MD  REASON FOR CONSULTATION: Recurrent seizure.   HISTORY OF PRESENT ILLNESS: Traci Holmes is a 69 year old Caucasian female who was admitted to [facility] hospital for being lethargic and found to be acidotic.   Initially thought to be due to a respiratory problem and had a PE work-up done in Traci ER, which was unremarkable.   Retrospectively, this might be coming from her postictal state.   Traci Holmes had a recent hospitalization as well.  23,  Traci Holmes was initially placed on BiPAP and got transferred to Traci floor.   Traci Holmes was noted to have a seizure in Traci morning. I do not have a description of her seizure semiology, but it was a "tonic-clonic seizure."   This first seizure was noted around 5:00 or 6:00 a.m. on 09/07/2012.   Traci Holmes was noted to have another seizure around 10:00 or 10:30.   Per Traci staff, Traci seizure was Traci same semiology.   Traci Holmes was loaded with fosphenytoin and has been transferred to Traci CCU.   Traci Holmes also had a CT scan of Traci head done, which showed white matter hypodensities on her CT scan of Traci head.   PAST MEDICAL HISTORY: Significant for uterine cancer, depression and dementia.   PAST SURGICAL HISTORY: Significant for hysterectomy and plastic surgery.   ALLERGIES: Traci Holmes does not have any known drug allergies.   SOCIAL HISTORY: Significant in that Traci Holmes lives in an assisted living facility. Smokes a pack per day. Traci Holmes does not have any known drug or alcohol use.   REVIEW OF SYSTEMS: Unobtainable.   FAMILY HISTORY: Significant for stroke and cancer.   MEDICATIONS: I reviewed her home medication list, which does include donepezil.   PHYSICAL EXAMINATION:   GENERAL: Traci Holmes is an  elder-looking, thin Caucasian female lying in ICU bed sleeping, minimally responsive.  VITAL SIGNS: Her temperature was 97.9, pulse 60, respiratory rate 26, blood pressure 95/64, pulse oximetry 94% on 2 liters of oxygen.  MENTAL STATUS: Traci Holmes was obtunded. Traci Holmes does respond to painful stimuli, but Traci Holmes does not open her eyes or speak. Traci Holmes does moan intermittently. Traci Holmes did receive Ativan soon after her seizure this morning. Traci Holmes has not received any sedation since then. Traci Holmes moved her arms and legs with painful stimuli, but no spontaneous movement was noted.  LUNGS: Clear to auscultation, except increased expiratory phase.  HEART: S1, S2 heart sounds. Carotid exam did not reveal any bruit.  NEUROLOGIC: Other detailed neurologic exam was not possible.   ASSESSMENT AND PLAN: 1.  Recurrent seizures of unknown etiology. I reviewed Traci Holmes's EEG. Traci Holmes does not seem to have any subclinical seizure at present.   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   Traci Holmes has been loaded with fosphenytoin 20 mg/kg. Due to her low weight, Traci total amount of Traci dose of  medicine is on Traci lower side.   We should continue fosphenytoin for at least 2 days, but in long run for her age and her body habitus, fosphenytoin can cause worsening  of her osteoporosis and other issues, so I will switch her over to levetiracetam. I will start her on levetiracetam 1000 mg twice a day for Traci first 2 days and then 500 mg twice a day.   2.  History of dementia, which might have been a vascular dementia based on her CT scan of Traci head and  previous history of smoking, but I do not have much details about her functional status other than Traci Holmes lives at assisted living facility.   Unfortunately, there is no neurology coverage available over Traci weekend, but feel free to page me and I will follow this Holmes remotely.   ____________________________ Royetta Crochet. Manuella Ghazi, MD hks:jm D: 09/07/2012 17:15:48 ET T: 09/07/2012 18:37:44 ET JOB#: [id number]  cc: Keng Jewel K. Manuella Ghazi, MD, <Dictator> Mikeal Hawthorne. Brynda Greathouse, MD Royetta Crochet [facility] MD ELECTRONICALLY SIGNED 09/17/2012 7:56

## 2014-06-13 NOTE — Consult Note (Signed)
PATIENT NAME:  Traci, SOBOTTA MR#:  [id number] DATE OF BIRTH:  1945-08-13  AGE:  68 years  SEX:  Female  RACE:  White  DATE OF CONSULTATION:  08/11/2012  DATE OF DICTATION:  08/11/2012  PLACE OF DICTATION:  [place], room [address], [place], [place]:  Derrich Gaby K. Panayiota Larkin, MD  SUBJECTIVE: The patient was seen in follow up assessment. According to information obtained from the staff, the patient had been confused and been forgetful, and cannot recall things that she has done, and been forgetful.  She is a 68 year old white female, not employed, and last worked many years ago. Widowed, and has a mentally retarded son who lives with her, and she is not able to care for him and her.   PAST PSYCHIATRIC HISTORY:  Please refer to the previous consultation.  MENTAL STATUS: The patient is alert and is confused. She said this is a hospital, with prompting and help, she said it was June 22, with prompting and help. She could not identify the nurse, and said "I don't know what she does." Admits that she is feeling depressed, but not as bad as before. Denies any visual hallucinations. She could count money. She said 4 quarters, 10 dimes, 20 nickels, 100 pennies in a dollar. For fire, she said she would leave. Memory and recall are poor, and she could remember 1 of the 3 objects I asked her to remember after a few minutes and several minutes. She is getting confused increasingly, and is not able to recall. Her memory has been poor, and is not able to function and take care of herself at this time. Insight and judgment guarded.    IMPRESSION:  Dementia, mild and early.  RECOMMENDATION: Continue current medication. Aricept 5 mg p.o. every day, to be increased to 10 mg in a couple of days. Haldol 1 mg p.o. q. 6 hours p.r.n. for agitation. Ativan 1 mg p.o. q. 6 hours p.r.n. for agitation. The patient needs to be considered for placement where she gets supervision and help as needed. She is not  able to function and take care of herself at this time, and is not able to take care of another person, like a mentally retarded son. Social Services is to approve appropriate placement for the patient.    ____________________________ Wallace Cullens. Franchot Mimes, MD skc:mr D: 08/11/2012 20:24:41 ET T: 08/11/2012 22:37:59 ET JOB#: [id number]  cc: Arlyn Leak K. Franchot Mimes, MD, <Dictator> Dewain Penning MD ELECTRONICALLY SIGNED 08/12/2012 18:28

## 2014-06-13 NOTE — Consult Note (Signed)
Chief Complaint:  Subjective/Chief Complaint Please see full GI consult.  Patient seen and examined, chart reviewed.  Patient brought to ER with nausea, found to have NSTEMI,  respiratory acidosis/hypoxia.  Consult for nausea and abdominal pain.   Currently patient denies both.  It is of note that patient has been on donepizil as o/p, the major SE being nausea.  Currently multifactorial, with NSTEMI, elevated TSH, meds.  Recommend scheduled dosings of zofran for 24-36 hours, then going back to prn.hold donepezil for now.  CT of chest done with PE protocol noted with possible cystic lesions in the tail of the pancreas.  Limited study.  Recommend CT abd with pancreatic protocol when clinically feasible.  Following.   VITAL SIGNS/ANCILLARY NOTES:  **Vital Signs.:   16-Jul-14 09:26  Respirations Respirations 34    10:30  Respirations Respirations 14    11:30  Respirations Respirations 21    12:30  Respirations Respirations 17    13:30  Respirations Respirations 30    14:30  Vital Signs Type Routine  Respirations Respirations 51  Systolic BP Systolic BP 737  Diastolic BP (mmHg) Diastolic BP (mmHg) 41  Mean BP 61  Pulse Ox % Pulse Ox % 100  Pulse Ox Activity Level  At rest  Oxygen Delivery 2L  Pulse Ox Heart Rate 66   Brief Assessment:  Cardiac Regular   Respiratory clear BS   Gastrointestinal details normal Soft  Nontender  Nondistended  Bowel sounds normal   Lab Results:  Thyroid:  16-Jul-14 01:34   Thyroid Stimulating Hormone  17.10 (0.45-4.50 (International Unit)  ----------------------- Pregnant patients have  different reference  ranges for TSH:  - - - - - - - - - -  Pregnant, first trimetser:  0.36 - 2.50 uIU/mL)   Electronic Signatures: Loistine Simas (MD)  (Signed 16-Jul-14 16:34)  Authored: Chief Complaint, VITAL SIGNS/ANCILLARY NOTES, Brief Assessment, Lab Results   Last Updated: 16-Jul-14 16:34 by Loistine Simas (MD)

## 2014-06-13 NOTE — Discharge Summary (Signed)
PATIENT NAME:  Traci Holmes, Traci Holmes MR#:  [id number] DATE OF BIRTH:  1945-03-11  DATE OF ADMISSION:  09/05/2012 DATE OF DISCHARGE:  09/11/2012  PRIMARY CARE PHYSICIAN:  Nicky Pugh, MD.    REASON FOR ADMISSION: Severe nausea with hypoxia and acute respiratory acidosis.   DISCHARGE DIAGNOSES:  1.  Metabolic encephalopathy.  2.  Seizure, new onset. 3.  Elevation of cardiac enzymes secondary to supply demand ischemia. No myocardial infarction.  4.  Acute respiratory acidosis with hypoxia.  5.  Hypotension.  6.  Bradycardia.  7.  Hypomagnesemia.  8.  Leukocytosis.  9.  Elevation of TSH.  10.  Pancreatic cystic mass with increased CA19.  11.  Dementia.  12.  Depression.  13.  History of uterine cancer. 14.  Diastolic dysfunction.  15.  Sinus bradycardia EKG.   DISPOSITION: Assisted living facility.   DISCHARGE MEDICATIONS:  Tylenol 325 mg two tablets every six hours, Keppra 1000 mg every 12 hours, Lipitor 20 mg at bedtime, aspirin 81 mg daily, Flovent 250/50 mcg two times daily, Spiriva 18 mcg daily,  potassium chloride 20 mEq two times a day, Ensure 250 mg three times a day.   FOLLOWUPJennings Books, MD, neurology, in the next two weeks; PCP in the next 1 to 2 weeks as well.  IMPORTANT RESULTS: Admission glucose 114, BUN 20, creatinine 0.82, AST 40. Other LFTs were in normal limits. Troponin 0.12. Followup troponins were 0.05 times two. Dilantin level on serum 19.1 after Dilantin was loaded. UDS was negative. White blood count 14,000 on admission, down to 8.8 at discharge. Hemoglobin 13.8. Blood cultures negative x2.   Urinalysis with no signs of urinary infection, and pH 7.27 on admission a pCO2 of 67 and a CO2 of 30. Echo Doppler:  The ventricular ejection fraction of 60% to 65%,  elevated left atrial  pressures, impaired relaxation pattern of left diastolic filling with structurally normal heart.    T3 was 4.3 which was normal. CA19 levels were 78 which is elevated. Vitamin B12 levels  413. RPR nonreactive. TSH was 17 and free thyroxine was 0.83,  normal low.    ____________________________ Mineral Sink, MD rsg:np D: 09/13/2012 18:47:56 ET T: 09/13/2012 22:39:45 ET JOB#: [id number]  cc: [place] Sink, MD, <Dictator> Mikeal Hawthorne. Brynda Greathouse, MD Hemang K. Manuella Ghazi, MD   [place] MD ELECTRONICALLY SIGNED 09/14/2012 20:49

## 2014-06-13 NOTE — H&P (Signed)
PATIENT NAME:  Traci Holmes, Traci Holmes MR#:  [id number] DATE OF BIRTH:  May 22, 1945  DATE OF ADMISSION:  08/04/2012  PRIMARY CARE PHYSICIAN: Jobe Igo. Gilford Raid, MD  REFERRING PHYSICIAN: Pollie Friar, MD  CHIEF COMPLAINT: Altered mental status.   HISTORY OF PRESENT ILLNESS: The patient is a 69 year old Caucasian female with a past medical history of headaches and uterine cancer, who was made IVC by DSS, and she was brought into the ER by sheriff. The patient has mentally challenged son, and social services goes to their home to assist the patient. The patient was found by DSS talking to herself and talking to cats, not taking care of herself, and she was made IVC by DSS, and police brought her into the ER. The patient was found to be delusional, talking to cat in the yard which was not there. CAT scan of the head did not reveal any acute changes except for age-related atrophy. Chest x-ray has revealed possible developing pneumonia. CK is elevated at 270. Urine drug screen is negative. White count is normal. The hospitalist team is called to admit the patient. Urinalysis has revealed trace leukocyte esterase. During my examination, the patient could tell her name, and she is aware that she is in the hospital, but she does not know why she is in the hospital. The patient does still seem to be confused. No family members are available at bedside. The patient's son, who is mentally challenged, is also brought into the ER and getting admitted to the hospital for hyponatremia.   PAST MEDICAL HISTORY: Headaches and uterine cancer, according to the old records. The patient was admitted to the hospital on 07/19/2012. During the past admission, the patient was found on the street by one of the neighbors, asking for a cigarette, who called police, and they brought her into the ER. At that time, her CPK was elevated, and the patient was treated for altered mental status.   PAST SURGICAL HISTORY: Hysterectomy and plastic surgery  in the past.   ALLERGIES: No known drug allergies.   MEDICATIONS: BC Powder.   PSYCHOSOCIAL HISTORY: Lives at home with her mentally challenged son. Smokes 1 pack a day. Drinks alcohol also. Illicit drug use is unknown  FAMILY HISTORY: According to the old records, CVAs and cancer run in her family.   REVIEW OF SYSTEMS: Unobtainable.   PHYSICAL EXAMINATION:  VITAL SIGNS: Temperature is 98 degrees Fahrenheit, pulse 57, respirations 18, blood pressure 90/42, pulse oximetry 99%.  GENERAL APPEARANCE: Not in acute distress. Moderately built and thin-appearing female. Cooperative. HEENT: Normocephalic, atraumatic. Pupils are equally reacting to light and accommodation. No scleral icterus. No conjunctival injection. No sinus tenderness. NECK: Supple. No JVD. No thyromegaly. No lymphadenopathy.  LUNGS: Clear to auscultation. No accessory muscle usage. No anterior chest wall tenderness on palpation.  CARDIAC: S1, S2 normal. Regular rate and rhythm. No murmurs.  GASTROINTESTINAL: Soft. Bowel sounds are positive in all 4 quadrants. Nontender, nondistended. No masses felt. No hepatosplenomegaly.  NEUROLOGIC: Awake and alert, oriented x2 to the person and place, disoriented to time. Follows verbal commands. Cranial nerves II through XII are intact. Motor and sensory are intact.  EXTREMITIES: No edema. No cyanosis. No clubbing. Reflexes are 2+.  SKIN: Warm to touch. Normal turgor. No rashes. No lesions.  PSYCHIATRIC: Mood and affect cannot be determined as the patient is with altered mental status.   LABORATORY AND IMAGING STUDIES: CAT scan of the head has revealed maxillary sinus disease, age-related atrophy and chronic white matter ischemic  changes. No evidence of acute intracranial abnormality. A 12-lead EKG has revealed sinus bradycardia at 51 beats per minute, but normal PR and QRS interval, nonspecific ST-T wave changes. CK total is 270, CPK 7.9, troponin less than 0.02. TSH is normal. Urine drug  screen is negative. CBC is normal. Urinalysis: Yellow in color, clear in appearance, leukocyte esterase trace, nitrite negative. Glucose 128, BUN 23, creatinine 0.94, sodium 142, potassium 3.9, chloride 109, CO2 28, GFR greater than 60, anion gap 5, serum osmolality 288, calcium 9.1. Serum ethanol level is less than 3.   ASSESSMENT AND PLAN: A 69 year old Caucasian female brought into the Emergency Room after she was made involuntary commitment by Division of Social Services for altered mental status and being delusional, was brought into the Emergency Room by cops, and hospitalist team is called to admit the patient for altered mental status. The patient was evaluated by psychiatry, but as apparently the patient does not have any past history of psychiatric problems, they have recommended medicine evaluation.   1. Altered mental status, possibly from developing pneumonia versus cystitis, differential diagnosis ?inhaled bath salts. Blood cultures and urine cultures are ordered. Will give her IV Rocephin and azithromycin empirically. Will get neuro checks. The patient is IVC. Psych consult is placed. She will be on one-on-one observation.  2. Mildly elevated creatine phosphokinase. Will provide her IV fluids and monitor CPK trends to rule out rhabdomyolysis.  3. History of uterine cancer and headaches. Currently, the patient denies any headache.  4. Will provide her gastrointestinal and deep vein thrombosis prophylaxis.   TOTAL TIME SPENT ON ADMISSION: 50 minutes.   CODE STATUS: As the patient is with altered mental status and no other family members are available, the patient will be full code.   ____________________________ Nicholes Mango, MD ag:OSi D: 08/04/2012 07:06:36 ET T: 08/04/2012 08:18:10 ET JOB#: [id number]  cc: Nicholes Mango, MD, <Dictator> Nicholes Mango MD ELECTRONICALLY SIGNED 08/06/2012 22:38

## 2014-06-13 NOTE — Discharge Summary (Signed)
PATIENT NAME:  Traci Holmes, Traci Holmes MR#:  [id number] DATE OF BIRTH:  [birth year]  DATE OF ADMISSION:  07/20/2012 DATE OF DISCHARGE:  07/21/2012  ADMITTING DIAGNOSIS:  Altered mental status.   DISCHARGE DIAGNOSES: 1.  Altered mental status of unclear etiology, resolved.  2.  Mild rhabdomyolysis.  3.  Hypokalemia.  4.  Hypomagnesemia.  5.  Hypernatremia due to normal saline infusion.  6.  History of headaches. 7.  Uterine cancer.  DISCHARGE CONDITION:  Stable.   DISCHARGE MEDICATIONS:  The patient is to continue [facility] powders one packet every 6 hours as needed.   HOME OXYGEN:  None.   DIET:  Regular, regular consistency.   ACTIVITY LIMITATIONS:  As tolerated.   REFERRAL:  To home health R.N. as well as social work.  The patient was recommended to establish primary care physician for social work help and be seen in the next few days after discharge.    CONSULTANTS:  Care management, social work, physical therapy.   RADIOLOGIC STUDIES:  Chest x-ray, portable, single view, 07/19/2012, showed hyperinflation.  Otherwise, no acute cardiopulmonary disease.  CT scan of head without contrast 07/19/2012 revealed no acute abnormality, white matter changes noted since suggesting chronic ischemia.  Echocardiogram 07/20/2012 revealed left ventricular ejection fraction by visual estimation of 60% to 65%.  Normal global left ventricular systolic function, mild concentric left ventricular hypertrophy, mild aortic valve sclerosis without stenosis.   HOSPITAL COURSE:  The patient is a 69 year old Caucasian female with past medical history significant for history of dementia who presented to the hospital after she was found down on the floor with altered mental status and the house full of cat food and possibly cat or human feces.  Please refer to Dr. Loma Sender admission note on 07/19/2012.  On arrival to the Emergency Room, the patient's blood pressure was 138/60, pulse was 61, respiratory rate was 18,  temperature was 98.3.  O2 sats were 93% on room air.  Physical exam was unremarkable.  The patient's EKG done on the day of admission showed normal sinus rhythm at 60 beats minute, anteroseptal infarct, age undetermined, but no acute ST-T changes were noted.   LABORATORY DATA:  BMP at 07/19/2012 showed elevation of BUN to 58, otherwise BMP was unremarkable.  The patient's liver enzymes revealed mild elevation of AST to 41.  However, patient's CK total was elevated to 441, with MB fraction elevated at 10.9, however troponin was normal on the first set of cardiac enzymes 0.05 as well as on the second two more cardiac enzyme sets.  TSH was normal at 2.58.  CBC was within normal limits with white blood cell count 7.3, hemoglobin 12.7, platelet count 187, MCV was high at 101.  The patient's absolute neutrophil count was normal at 5.2.  Urinalysis revealed yellow clear urine, negative for glucose or bilirubin, 1+ ketones were noted, specific gravity 1.020, pH was 5.0, 2+ blood, 30 mg/dL protein, negative for nitrites or leukocyte esterase, 2 red blood cells, 16 white blood cells were noted.  No bacteria were noted.  The patient's urine cultures, clean catch, showed mixed bacterial organisms, results suggestive of contamination.   The patient was admitted to the hospital for further evaluation.  Her cardiac enzymes were cycled and she was started on IV fluids.  With this, the patient's condition improved.  By 07/21/2012, the patient is back to her baseline.  She is alert, oriented.  She however has difficulty understanding what happened to her and she has very poor insight to  possible recurrences.  At this point we are not really sure what the reason of her altered mental status was, however her condition normalized and resolved.  Also, (Dictation Anomaly) studies we did in the hospital showed no obvious infection or stroke.  The patient was ambulated while in the hospital and recommended no needs.  She was also  evaluated by social work and BSS was informed.  The patient will be discharged home with home health, R.N. as well as social work [place].  She is also to establish primary care physician.  Social workers are going to make appointments for this patient for the next week with a physician to establish primary care physician.    In regards to mild rhabdomyolysis, as mentioned above, the patient's CK levels were around 400.  With IV fluid administration, patient's CK levels improved.  It was felt that the patient's mild elevation of AST very likely was related to mild rhabdomyolysis.  No MI was noted as the patient's troponins were within normal limits x 3.  The patient was noted to have hypokalemia on 07/20/2012.  The patient's potassium level was 3.1.  Magnesium level checked at that time was 1.8 which was within normal limits, however rechecked labs on 07/21/2012, revealed the same low potassium level of 3.1 and low magnesium level 1.4.  The patient will be given some magnesium as well as potassium supplementation today on 07/21/2012 and if patient's labs look somewhat improved she will likely be discharged home today.  With normal saline administration the patient's sodium level was high at 148 on 07/21/2012.  The patient's IV fluids will be changed today on 07/21/2012 and sodium level will be rechecked.  Again if labs seem to be improved the patient will likely be discharged home.  Of note, the patient is able to drink and eat and she is consuming food, however exact percentage of meal eaten was not documented.  The patient was also noted to have good urinary output.   In regards to her headaches, the patient is to continue her BC powders, however care should be taken and the patient should be followed possibly BC powder should be changed into some other medications to help with headaches.    There were no indication for further evaluation for history of uterine cancer.  On day of discharge, 07/21/2012,  the patient felt satisfactory, did not complain of any significant discomfort.  She wanted to go home.  She was noted to have a low heart rate, pulse ranging from 40s to 50s, however her blood pressure remained stable.  TSH, as mentioned above, was checked.  It was found to be normal.  No telemetry abnormalities were found while she was in the hospital.  The patient's vital signs on 07/21/2012, temperature 97.3, pulse ranging from 41 to 50, respiration rate was 18 to 20, blood pressure 188 systolic to 416 and 60Y to 30Z diastolic.  O2 sats were 95% on room air at rest.   TIME SPENT:  40 minutes.    ____________________________ Theodoro Grist, MD rv:ea D: 07/21/2012 15:16:48 ET T: 07/21/2012 20:35:02 ET JOB#: [id number]  cc: Theodoro Grist, MD, <Dictator> Outside Physician [place] MD ELECTRONICALLY SIGNED 07/29/2012 18:58

## 2014-06-14 NOTE — H&P (Signed)
PATIENT NAME:  Traci Holmes, SEVILLE MR#:  [id number] DATE OF BIRTH:  10-Oct-1945  DATE OF ADMISSION:  01/28/2014  REFERRING PHYSICIAN:  Ahmed Prima, MD   PRIMARY CARE PHYSICIAN:  Marden Noble, MD   CHIEF COMPLAINT: Altered mental status.   The patient herself is unable to provide any meaningful information mixed mental status condition. History obtained from the nursing staff as well as son who is not present at bedside. Her history obtained from EMS as well as Emergency Department staff.   HISTORY OF PRESENT ILLNESS:  This is a 69 year old Caucasian female with past medical history of COPD as well as anxiety, depression, not otherwise specified, and dementia presenting with altered mental status.  Altered mental status is described by her son who last saw her normal 5 days ago, states that she was actually confused at that time more than her baseline. He could not get a hold of her via phone, but subsequently called EMS today. Upon EMS arrival the patient was noted to be covered in feces with animals running around her house. She was without pants and the house was essentially in [place]; brought to the Emergency Department for further workup and evaluation. Upon arrival she was noted to be hypoxemic in the Emergency Department.   REVIEW OF SYSTEMS: Unobtainable at this time, given the patient's mental status and medical condition.   PAST MEDICAL HISTORY: COPD, uterine cancer, dementia, depression, anxiety not otherwise specified, hyperlipidemia.   FAMILY HISTORY: Positive for CVA per documentation.   SOCIAL HISTORY: Positive for tobacco use per documentation. She previously had been living in assisted living; however, apparently lives by herself.   ALLERGIES: No known drug allergies.   HOME MEDICATIONS: Include acetaminophen 325 two tabs p.o. q. 6 hours as needed for pain; aspirin 81 mg p.o. daily, Keppra 1000 mg p.o. b.i.d., atorvastatin 20 mg p.o. at bedtime; fluticasone salmeterol 250/50  mcg inhalation 1 puff b.i.d., Spiriva 18 mcg inhalation daily, potassium 20 mEq p.o. b.i.d., Ensure 240 p.o. 3 times daily.   PHYSICAL EXAMINATION: VITAL SIGNS: Temperature 98.3, heart rate 57, respirations 18, blood pressure 112/42, saturating 86% on room air, saturating 93% on supplemental O2, weight 45.4 kg, BMI 19.5.   GENERAL: Chronically ill frail-appearing Caucasian female, currently in moderate distress given mental status and respiratory status.  HEAD: Normocephalic, atraumatic.  EYES: Pupils equal, round, and reactive to light. Extraocular muscles are unable to be fully assessed given patient's mental status; no scleral icterus.  MOUTH: Dry mucosal membrane. Dentition intact. No exudates noted.  EAR AND NOSE AND THROAT:  Clear without exudates, no external lesions noted NECK: Supple. No thyromegaly. No nodules. No JVD.  PULMONARY: Greatly diminished breath sounds throughout all lung fields with scant wheezing predominantly of the right base, poor respiratory effort, she is however not tachypneic, no use of accessory muscles.  CHEST: Nontender to palpation.  CARDIOVASCULAR: S1, S2, regular rate and rhythm, no murmurs, rubs, or gallops, no edema, pedal pulses 2+ bilaterally.  GASTROINTESTINAL: Soft, nontender, nondistended, no mass, positive bowel sounds, no hepatosplenomegaly.  MUSCULOSKELETAL: no edema or clubbing however unable to fully assess given patient's mental status and medical condition. She is extremely lethargic; however, does respond to painful stimuli. She actually woke up and spoke that, "I feel like shit," but unable to follow any actual meaningful commands.  SKIN: No ulceration, lesions, rash or cyanosis, warm and dry, turgor poor; neurologic unable to fully assess given patient's mental status. Once again she is extremely lethargic, however, did  awake for painful stimuli for a brief period of time, promptly falling back to sleep.   DIAGNOSTIC DATA: Sodium 141, potassium  3.8, chloride 105, bicarbonate 32, BUN 25, creatinine 0.68, glucose 96, albumin of 2.9; otherwise LFTs within normal limits, TSH 0.98, urine drug screen within normal limits, WBC of 8.6, hemoglobin 14.3, platelets of 302,000, urinalysis negative for evidence of infection. She had a chest x-ray performed which reveals early bibasilar interstitial edema with probable small pleural effusions.  CT head performed and no acute findings. CT for PE performed, which was negative, a CT chest was essentially no acute findings, no PE; there is evidence of emphysematous changes as well as reticular nodular infiltrates right lower lobe with scattered areas of atelectasis in the right lower lobe as well. There are scattered pulmonary nodules 6 mm in diameter, not noted on previous films.   ASSESSMENT AND PLAN: This is a 69 year old Caucasian female with known history of chronic obstructive pulmonary disease and dementia presenting with altered mental status.  1.  Acute encephalopathy, unclear etiology at this time. However, we will check an ABG now, neurologic checks q. 4 hours, if ABG is normal and no improvement we will need MRI to rule out cerebrovascular accident even though this is extremely unlikely, given the lack of focal neurological findings.  2.  Chronic obstructive pulmonary disease exacerbation. Place on DuoNeb treatments q. 4 hours, supplemental O2, give SaO2 greater than 92%, Solu-Medrol 60 mg intravenous daily. We will continue home medications, include her breathing treatments.  3.  Hyperlipidemia.  Statin therapy.  4.  Venous thrombosis.  Heparin subcutaneous.  5.  Patient is a full code.   As I finished dictation the ABG has actually just returned, brought to me personally by respiratory, revealing hypercapnic respiratory failure with pH in the 7.2 range, and CO2 of 70, so patient is now being currently placed on BiPAP therapy, repeat ABG 30-60 minutes  TIME SPENT: 55 minutes.     ____________________________ Aaron Mose. Hower, MD dkh:nt D: 01/28/2014 21:49:24 ET T: 01/28/2014 22:28:44 ET JOB#: [id number]  cc: Aaron Mose. Hower, MD, <Dictator> DAVID Woodfin Gan[place] MD ELECTRONICALLY SIGNED 01/29/2014 20:42

## 2014-06-14 NOTE — Discharge Summary (Signed)
PATIENT NAME:  Traci Holmes, WARRIOR MR#:  [id number] DATE OF BIRTH:  06-06-1945  DATE OF ADMISSION:  01/28/2014 DATE OF DISCHARGE:  01/31/2014   ADMISSION DIAGNOSIS:  1.  Acute encephalopathy.  2.  Acute respiratory failure with hypercapnia.  3.  Hyperlipidemia.   CONSULTATIONS: None.   LABORATORIES AT DISCHARGE: CT of the head was negative for acute intracranial hemorrhage or CVA. Ammonia level was 36, very slightly elevated. TSH 0.861.   HOSPITAL COURSE: This is a 69 year old female who presented on 01/28/2014 with altered mental status and acute encephalopathy. For further details please refer to the H and P.  1.  Acute encephalopathy. I suspect this is related to her hypercapnic respiratory failure. Her mental status has significantly improved; however, she had 2 days of some lethargy, so I repeated her head CT, which was negative. I also ordered an ammonia level and TSH. Ammonia level was very minimally elevated. TSH was normal. She is back to her baseline with some very minimal confusion.  2.  Acute respiratory failure with hypercapnia and COPD exacerbation. The patient was initially placed on BiPAP machine, titrated oxygen and doing quite well with nasal cannula. On discharge, her lungs were clear to auscultation. There was no wheezing heard, and she had good air movement. She was transitioned to p.o. steroids.  3.  Hyperlipidemia, mixed. The patient was on atorvastatin.   DISCHARGE MEDICATIONS:  1.  Acetaminophen 325 mg 2 tablets q. 6 hours p.r.n. pain.  2.  Keppra 1000 mg b.i.d.  3.  Atorvastatin 20 mg daily.  4.  Aspirin 81 mg daily.  5.  Advair Diskus 250/50 b.i.d.  6.  Spiriva 18 mcg daily.  7.  Potassium chloride 20 mEq b.i.d.  8.  Ensure 240 mL t.i.d.   9.  Prednisone taper starting at 50 mg, taper by 10 mg every 2 days.  10.  Nicotine inhaler every 2 hours p.r.n. nicotine craving.  11.  Azithromycin 500 mg daily for 5 days.   DISCHARGE OXYGEN: 2 L nasal cannula.   DISCHARGE  DIET: Regular diet with pureed, nectar thick liquids. Strict aspiration precautions.   DIETARY SUPPLEMENT: Ensure t.i.d.   DISCHARGE REFERRAL: Physical therapy.   DISCHARGE FOLLOWUP: Dr. Brynda Greathouse in 2 weeks.   The patient is medically stable for discharge.   Case management is assisting with discharge disposition to a skilled nursing facility. The patient will be discharged to SNF when bed available.   TIME SPENT: 35 minutes.    ____________________________ Donell Beers. Benjie Karvonen, MD spm:MT D: 01/31/2014 12:29:00 ET T: 01/31/2014 12:47:47 ET JOB#: [id number]  cc: Ayeden Gladman P. Benjie Karvonen, MD, <Dictator> Donell Beers Breanna Mcdaniel MD ELECTRONICALLY SIGNED 01/31/2014 13:52

## 2014-06-18 NOTE — Discharge Summary (Signed)
PATIENT NAME:  Traci Holmes, Traci Holmes MR#:  [id number] DATE OF BIRTH:  1945-09-28  DATE OF ADMISSION:  01/28/2014 DATE OF DISCHARGE:    ADDENDUM:  ADMITTING PHYSICIAN: Aaron Mose. Hower, MD  DISCHARGING PHYSICIAN: Gladstone Lighter, MD    For more details, please look at the discharge summary dictated by Sital P. Benjie Karvonen, MD on 01/31/2014.   PRIMARY CARE PHYSICIAN: Mikeal Hawthorne. Brynda Greathouse, MD   HOSPITAL COURSE:  In brief, Ms. Burek is a 69 year old Caucasian female with known history of chronic obstructive pulmonary disease, history of uterine cancer, mild dementia, depression, who presents to the hospital secondary to change in mental status. She was noted to  have CO2 narcosis with elevated CO2 on admission.   Acute encephalopathy secondary to CO2 narcosis and hypercapnic respiratory failure. She improved with BiPAP. She was also treated for her COPD exacerbation with steroids, nebulizers and inhalers. Right now, the patient is stable. Physical therapy recommended rehabilitation. The patient was awaiting a rehabilitation placement bed. She has a place, and she is leaving today.Marland Kitchen   DISCHARGE CONDITION: Stable.   DISCHARGE DISPOSITION: Short term rehabilitation.  TIME SPENT ON DISCHARGE: Additional time spent was 35 minutes.   MEDICATIONS: No changes have been made to her medications.   DISCHARGE INSTRUCTIONS: Please look at the discharge instructions and  medications done by Dr. Benjie Karvonen on January 31, 2014.   ____________________________ Gladstone Lighter, MD rk:ap D: 02/01/2014 11:32:18 ET T: 02/01/2014 15:22:25 ET JOB#: [id number]  cc: Gladstone Lighter, MD, <Dictator> Gladstone Lighter MD ELECTRONICALLY SIGNED 02/25/2014 13:49

## 2014-08-25 ENCOUNTER — Emergency Department: Payer: Medicare Other

## 2014-08-25 ENCOUNTER — Encounter: Payer: Self-pay | Admitting: Psychiatry

## 2014-08-25 ENCOUNTER — Emergency Department
Admission: EM | Admit: 2014-08-25 | Discharge: 2014-08-28 | Disposition: A | Discharge status: Home / Self Care | Payer: Medicare Other | Attending: Emergency Medicine | Admitting: Emergency Medicine

## 2014-08-25 DIAGNOSIS — F0390 Unspecified dementia without behavioral disturbance: Secondary | ICD-10-CM | POA: Insufficient documentation

## 2014-08-25 DIAGNOSIS — Z880 Allergy status to penicillin: Secondary | ICD-10-CM | POA: Insufficient documentation

## 2014-08-25 DIAGNOSIS — F99 Mental disorder, not otherwise specified: Secondary | ICD-10-CM

## 2014-08-25 DIAGNOSIS — F0150 Vascular dementia without behavioral disturbance: Secondary | ICD-10-CM

## 2014-08-25 DIAGNOSIS — R410 Disorientation, unspecified: Secondary | ICD-10-CM

## 2014-08-25 DIAGNOSIS — Z046 Encounter for general psychiatric examination, requested by authority: Secondary | ICD-10-CM | POA: Diagnosis present

## 2014-08-25 HISTORY — DX: Vascular dementia without behavioral disturbance: F01.50

## 2014-08-25 HISTORY — DX: Unspecified dementia, unspecified severity, without behavioral disturbance, psychotic disturbance, mood disturbance, and anxiety: F03.90

## 2014-08-25 LAB — URINE DRUG SCREEN, QUALITATIVE (ARMC ONLY)
Amphetamines, Ur Screen: POSITIVE — AB
Amphetamines, Ur Screen: NOT DETECTED
BARBITURATES, UR SCREEN: NOT DETECTED
Barbiturates, Ur Screen: NOT DETECTED
Benzodiazepine, Ur Scrn: NOT DETECTED
Benzodiazepine, Ur Scrn: NOT DETECTED
CANNABINOID 50 NG, UR ~~LOC~~: POSITIVE — AB
Cannabinoid 50 Ng, Ur ~~LOC~~: NOT DETECTED
Cocaine Metabolite,Ur ~~LOC~~: NOT DETECTED
Cocaine Metabolite,Ur ~~LOC~~: NOT DETECTED
MDMA (ECSTASY) UR SCREEN: NOT DETECTED
MDMA (Ecstasy)Ur Screen: NOT DETECTED
METHADONE SCREEN, URINE: NOT DETECTED
Methadone Scn, Ur: NOT DETECTED
OPIATE, UR SCREEN: NOT DETECTED
Opiate, Ur Screen: POSITIVE — AB
PHENCYCLIDINE (PCP) UR S: POSITIVE — AB
PHENCYCLIDINE (PCP) UR S: NOT DETECTED
Tricyclic, Ur Screen: NOT DETECTED
Tricyclic, Ur Screen: NOT DETECTED

## 2014-08-25 LAB — COMPREHENSIVE METABOLIC PANEL
ALT: 9 U/L — ABNORMAL LOW (ref 14–54)
ANION GAP: 11 (ref 5–15)
AST: 26 U/L (ref 15–41)
Albumin: 4.6 g/dL (ref 3.5–5.0)
Alkaline Phosphatase: 53 U/L (ref 38–126)
BILIRUBIN TOTAL: 0.3 mg/dL (ref 0.3–1.2)
BUN: 40 mg/dL — ABNORMAL HIGH (ref 6–20)
CALCIUM: 9.9 mg/dL (ref 8.9–10.3)
CO2: 29 mmol/L (ref 22–32)
Chloride: 101 mmol/L (ref 101–111)
Creatinine, Ser: 0.75 mg/dL (ref 0.44–1.00)
GFR calc non Af Amer: >60 mL/min (ref >60)
GLUCOSE: 208 mg/dL — AB (ref 65–99)
Potassium: 3.3 mmol/L — ABNORMAL LOW (ref 3.5–5.1)
Sodium: 141 mmol/L (ref 135–145)
Total Protein: 7.7 g/dL (ref 6.5–8.1)

## 2014-08-25 LAB — TSH: TSH: 3.48 u[IU]/mL (ref 0.350–4.500)

## 2014-08-25 LAB — URINALYSIS COMPLETE WITH MICROSCOPIC (ARMC ONLY)
BILIRUBIN URINE: NEGATIVE
Glucose, UA: NEGATIVE mg/dL
Ketones, ur: NEGATIVE mg/dL
Leukocytes, UA: NEGATIVE
NITRITE: NEGATIVE
Protein, ur: 100 mg/dL — AB
Specific Gravity, Urine: 1.025 (ref 1.005–1.030)
pH: 5 (ref 5.0–8.0)

## 2014-08-25 LAB — CBC
HEMATOCRIT: 46.6 % (ref 35.0–47.0)
HEMOGLOBIN: 15.1 g/dL (ref 12.0–16.0)
MCH: 32.3 pg (ref 26.0–34.0)
MCHC: 32.5 g/dL (ref 32.0–36.0)
MCV: 99.4 fL (ref 80.0–100.0)
Platelets: 166 10*3/uL (ref 150–440)
RBC: 4.69 MIL/uL (ref 3.80–5.20)
RDW: 16 % — AB (ref 11.5–14.5)
WBC: 5.6 10*3/uL (ref 3.6–11.0)

## 2014-08-25 LAB — VITAMIN B12: Vitamin B-12: 549 pg/mL (ref 180–914)

## 2014-08-25 LAB — SALICYLATE LEVEL: SALICYLATE LVL: 29.4 mg/dL (ref 2.8–30.0)

## 2014-08-25 LAB — FOLATE: Folate: 14.6 ng/mL (ref >5.9)

## 2014-08-25 LAB — ACETAMINOPHEN LEVEL: Acetaminophen (Tylenol), Serum: <10 ug/mL — ABNORMAL LOW (ref 10–30)

## 2014-08-25 LAB — ETHANOL: Alcohol, Ethyl (B): <5 mg/dL (ref <5)

## 2014-08-25 MED ORDER — NICOTINE 10 MG IN INHA: 1.0000 | RESPIRATORY_TRACT | Status: DC | PRN

## 2014-08-25 MED ORDER — NICOTINE 10 MG IN INHA
RESPIRATORY_TRACT | Status: AC
  Filled 2014-08-25: qty 36

## 2014-08-25 NOTE — ED Notes (Signed)
BEHAVIORAL HEALTH ROUNDING Patient sleeping: NO Patient alert and oriented: YES Behavior appropriate: YES Describe behavior: No inappropriate or unacceptable behaviors noted at this time.  Nutrition and fluids offered: YES Toileting and hygiene offered: YES Sitter present: Industrial/product designer rounding every 15 minutes on patient to ensure safety.  Law enforcement present: Programmer, applications agency: [employer] ([place])

## 2014-08-25 NOTE — ED Notes (Signed)
BEHAVIORAL HEALTH ROUNDING Patient sleeping: No. Patient alert and oriented: yes Behavior appropriate: Yes.  ; If no, describe:  Nutrition and fluids offered: Yes  Toileting and hygiene offered: Yes  Sitter present: no Law enforcement present: Yes  

## 2014-08-25 NOTE — ED Provider Notes (Signed)
-----------------------------------------   7:44 PM on 08/25/2014 -----------------------------------------  I have spoke with Dr. Tami Ribas about this patient. A repeat urine drug screen was performed because the first showed evidence of 4 agents. This was highly suspicious and deemed unlikely. The recent urine drug screen is negative for any intoxicants or other agents.  Dr. Tami Ribas believes this patient to be suffering from a geriatric psychiatric condition and recommends that we seek care at a facility that can provide the needs of the patient.  I have obtained a chest x-ray and EKG because [facility] is a facility that we will refer to and they require these tests.  ED ECG REPORT I, Liesl Simons W, the attending physician, personally viewed and interpreted this ECG.   Date: 08/25/2014  EKG Time: 1941  Rate: 46  Rhythm: sinus bradycardia  Axis: normal at 65  Intervals: QTC is short at 355  ST&T Change: none noted  We will seek transfer to meet the needs of the patient.   Ahmed Prima, MD 08/25/14 [phone]

## 2014-08-25 NOTE — ED Notes (Signed)
BEHAVIORAL HEALTH ROUNDING Patient sleeping: No. Patient alert and oriented : awake Behavior appropriate: Yes.  ; If no, describe:  Nutrition and fluids offered: Yes  Toileting and hygiene offered: Yes  Sitter present: no Law enforcement present: Yes

## 2014-08-25 NOTE — ED Provider Notes (Addendum)
-----------------------------------------   11:47 AM on 08/25/2014 -----------------------------------------  As per the request by Dr. Tami Ribas, the psychiatrist, I added on TSH, vitamin B12 level, and folate level.  I also ordered a noncontrast head CT because he appreciated some anisocoria and wants to rule out an intracranial abnormality as a potential cause of her altered mental status.  He also requested a chest x-ray (AMS and/or placement issues).  The results of the CT head and chest x-ray are unremarkable.  Hinda Kehr, MD 08/25/14 [phone]

## 2014-08-25 NOTE — ED Notes (Signed)

## 2014-08-25 NOTE — ED Notes (Signed)

## 2014-08-25 NOTE — ED Notes (Signed)
Pt ambulatory to triage with no difficulty. Pt brought in to ED by [employer] officer of [place] with IVC papers that state Pt is not eating , refusing care. Does not know where she is and is refusing to go get help. Papers taken out by her son.Pt is confused to year and month but does know who she is and where she is and who the President is .

## 2014-08-25 NOTE — ED Notes (Signed)
BEHAVIORAL HEALTH ROUNDING Patient sleeping: NO Patient alert and oriented: YES Behavior appropriate: YES Describe behavior: No inappropriate or unacceptable behaviors noted at this time.  Nutrition and fluids offered: YES Toileting and hygiene offered: YES Sitter present: Industrial/product designer rounding every 15 minutes on patient to ensure safety.  Law enforcement present: Programmer, applications agency: [employer] ([place]S)

## 2014-08-25 NOTE — ED Notes (Signed)
BEHAVIORAL HEALTH ROUNDING Patient sleeping: No. Patient alert and oriented: no Behavior appropriate: No.; If no, describe: demented Nutrition and fluids offered: Yes  Toileting and hygiene offered: Yes  Sitter present: no Law enforcement present: Yes  and [place]

## 2014-08-25 NOTE — Consult Note (Addendum)
[facility] Face-to-Face Psychiatry Consult   Reason for Consult:  Increasing confusion Referring Physician:  Loney Hering, MD  Patient Identification: Traci Holmes MRN:  [id number]   Principal Diagnosis: Major neurocognitive disorder, unspecified Diagnosis:   Patient Active Problem List   Diagnosis Date Noted  . Major neurocognitive disorder, unspecified [F99] 08/25/2014    Total Time spent with patient: 1.5 hours  Subjective:   Traci Holmes is a 69 y.o. female patient admitted with memory problems for about 1 month. She has issues with remembering things happening when she was child, a few days ago and immediate recall. Pt denies feeling sick or ill lately. Pt endorses feeling tired and states this has not been normal. Pt is not certain about a h/o TBI but thinks this may have occurred when she was younger.    The pt shared she lives alone with her dog. She drives, grocery shops, pays her own bills (she denies being late on her bills), does not get lost when driving and at times has difficulty finding items in the grocery store.   The IVC paperwork states the pt experiences increasing confusion, refusing to eat and take medications. The pt is not able to tell me which medications she takes.   The Montreal Cognitive Assessment [facility]) was performed during the interview and the pt scored an 12/30 (with 1 point added for completing less than or equal to 12 years of education). The pt missed points on the [facility] in the following domains: visuospatial/executive functioning; attention, language, abstraction, delayed recall and orientation. She provided the incorrect date, day of the week and where she is currently located. She was not able to draw the numbers on at clock correctly. She was not able to perform serial 7s or correctly identify the letters when a list of letters is read to her. She was able to name at least 11 words that began with the letter F in 1 minute and she could not recall 5 words  given to her immediately or about 10 minutes later.    As the pt attempted to perform delayed recall, she became perseverative and began to name words starting with F which she previously named.    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 She smokes and will check for reversible causes of confusion/memory loss.   This provider attempted to contact her son, Darrin Luis but the phone number listed on the IVC is incorrect. Also attempted to contact Quenton Fetter listed under demographics for the pt and this provider was not able to leave a message on [phone] because the voicemail box was not setup and the [phone] phone number did not work.   Pt currently denies SI, HI and AVH.   HPI:  See above HPI Elements:   Location:  see above. Quality:  worsening. Severity:  moderate to severe. Timing:  the past 24 hours. Duration:  1 month. Context:  no known stressors but worsening memory loss. Has anisocoria and is a smoker. .   Weapons: Gun, Acupuncturist  Past Psych History Denies past psychiatric hospitalization Medications: Believes taking medications for depression or anxiety in the past.  Suicide: Denies  SHx: Divorced Ex-husband about 3-4 years ago 2 sons No grandchildren Highest level of education was 12th grade  Substance Pt denies drug and alcohol use  FHx:  Depression: mother Anxiety: mother occasionally  Bipolar d/o: not certain Schizophrenia: not certain Suicide: denies  Past Medical History:  No past medical history on file. No past surgical history on file.   Hysterectomy for cancer Denies medical history  Family History: No family history on file.  Unknown  Social History:  History  Alcohol Use: Not on file     History  Drug Use Not on file    History   Social History  . Marital Status: Married    Spouse Name: N/A  . Number of Children: N/A  . Years of Education: N/A   Social History Main Topics  . Smoking  status: Not on file  . Smokeless tobacco: Not on file  . Alcohol Use: Not on file  . Drug Use: Not on file  . Sexual Activity: Not on file   Other Topics Concern  . Not on file   Social History Narrative  . No narrative on file   Additional Social History:    Pain Medications:  (None Reported)  Allergies:   Allergies  Allergen Reactions  . Penicillins     Unknown reaction    Labs:  Results for orders placed or performed during the hospital encounter of 08/25/14 (from the past 48 hour(s))  Acetaminophen level     Status: Abnormal   Collection Time: 08/25/14  1:53 AM  Result Value Ref Range   Acetaminophen (Tylenol), Serum <10 (L) 10 - 30 ug/mL    Comment:        THERAPEUTIC CONCENTRATIONS VARY SIGNIFICANTLY. A RANGE OF 10-30 ug/mL MAY BE AN EFFECTIVE CONCENTRATION FOR MANY PATIENTS. HOWEVER, SOME ARE BEST TREATED AT CONCENTRATIONS OUTSIDE THIS RANGE. ACETAMINOPHEN CONCENTRATIONS >150 ug/mL AT 4 HOURS AFTER INGESTION AND >50 ug/mL AT 12 HOURS AFTER INGESTION ARE OFTEN ASSOCIATED WITH TOXIC REACTIONS.   CBC     Status: Abnormal   Collection Time: 08/25/14  1:53 AM  Result Value Ref Range   WBC 5.6 3.6 - 11.0 K/uL   RBC 4.69 3.80 - 5.20 MIL/uL   Hemoglobin 15.1 12.0 - 16.0 g/dL   HCT 46.6 35.0 - 47.0 %   MCV 99.4 80.0 - 100.0 fL   MCH 32.3 26.0 - 34.0 pg   MCHC 32.5 32.0 - 36.0 g/dL   RDW 16.0 (H) 11.5 - 14.5 %   Platelets 166 150 - 440 K/uL  Comprehensive metabolic panel     Status: Abnormal   Collection Time: 08/25/14  1:53 AM  Result Value Ref Range   Sodium 141 135 - 145 mmol/L   Potassium 3.3 (L) 3.5 - 5.1 mmol/L   Chloride 101 101 - 111 mmol/L   CO2 29 22 - 32 mmol/L   Glucose, Bld 208 (H) 65 - 99 mg/dL   BUN 40 (H) 6 - 20 mg/dL   Creatinine, Ser 0.75 0.44 - 1.00 mg/dL   Calcium 9.9 8.9 - 10.3 mg/dL   Total Protein 7.7 6.5 - 8.1 g/dL   Albumin 4.6 3.5 - 5.0 g/dL   AST 26 15 - 41 U/L   ALT 9 (L) 14 - 54 U/L   Alkaline Phosphatase 53 38 - 126 U/L    Total Bilirubin 0.3 0.3 - 1.2 mg/dL   GFR calc non Af Amer >60 >60 mL/min   GFR calc Af Amer >60 >60 mL/min    Comment: (NOTE) The eGFR has been calculated using the CKD EPI equation. This calculation has not been validated in all clinical situations. eGFR's persistently <60 mL/min signify possible Chronic Kidney Disease.    Anion gap 11 5 - 15  Ethanol (ETOH)  Status: None   Collection Time: 08/25/14  1:53 AM  Result Value Ref Range   Alcohol, Ethyl (B) <5 <5 mg/dL    Comment:        LOWEST DETECTABLE LIMIT FOR SERUM ALCOHOL IS 5 mg/dL FOR MEDICAL PURPOSES ONLY   Salicylate level     Status: None   Collection Time: 08/25/14  1:53 AM  Result Value Ref Range   Salicylate Lvl 29.4 2.8 - 30.0 mg/dL  Urine Drug Screen, Qualitative ([facility] only)     Status: Abnormal   Collection Time: 08/25/14  1:53 AM  Result Value Ref Range   Tricyclic, Ur Screen NONE DETECTED NONE DETECTED   Amphetamines, Ur Screen POSITIVE (A) NONE DETECTED   MDMA (Ecstasy)Ur Screen NONE DETECTED NONE DETECTED   Cocaine Metabolite,Ur [place] NONE DETECTED NONE DETECTED   Opiate, Ur Screen POSITIVE (A) NONE DETECTED   Phencyclidine (PCP) Ur S POSITIVE (A) NONE DETECTED   Cannabinoid 50 Ng, Ur [place] POSITIVE (A) NONE DETECTED   Barbiturates, Ur Screen NONE DETECTED NONE DETECTED   Benzodiazepine, Ur Scrn NONE DETECTED NONE DETECTED   Methadone Scn, Ur NONE DETECTED NONE DETECTED    Comment: (NOTE) 100  Tricyclics, urine               Cutoff 1000 ng/mL 200  Amphetamines, urine             Cutoff 1000 ng/mL 300  MDMA (Ecstasy), urine           Cutoff 500 ng/mL 400  Cocaine Metabolite, urine       Cutoff 300 ng/mL 500  Opiate, urine                   Cutoff 300 ng/mL 600  Phencyclidine (PCP), urine      Cutoff 25 ng/mL 700  Cannabinoid, urine              Cutoff 50 ng/mL 800  Barbiturates, urine             Cutoff 200 ng/mL 900  Benzodiazepine, urine           Cutoff 200 ng/mL 1000 Methadone, urine                 Cutoff 300 ng/mL 1100 1200 The urine drug screen provides only a preliminary, unconfirmed 1300 analytical test result and should not be used for non-medical 1400 purposes. Clinical consideration and professional judgment should 1500 be applied to any positive drug screen result due to possible 1600 interfering substances. A more specific alternate chemical method 1700 must be used in order to obtain a confirmed analytical result.  1800 Gas chromato graphy / mass spectrometry (GC/MS) is the preferred 1900 confirmatory method.   Urinalysis complete, with microscopic ([facility] only)     Status: Abnormal   Collection Time: 08/25/14  1:53 AM  Result Value Ref Range   Color, Urine YELLOW (A) YELLOW   APPearance CLEAR (A) CLEAR   Glucose, UA NEGATIVE NEGATIVE mg/dL   Bilirubin Urine NEGATIVE NEGATIVE   Ketones, ur NEGATIVE NEGATIVE mg/dL   Specific Gravity, Urine 1.025 1.005 - 1.030   Hgb urine dipstick 1+ (A) NEGATIVE   pH 5.0 5.0 - 8.0   Protein, ur 100 (A) NEGATIVE mg/dL   Nitrite NEGATIVE NEGATIVE   Leukocytes, UA NEGATIVE NEGATIVE   RBC / HPF 0-5 0 - 5 RBC/hpf   WBC, UA 0-5 0 - 5 WBC/hpf   Bacteria, UA RARE (A) NONE SEEN  Squamous Epithelial / LPF 0-5 (A) NONE SEEN   Mucous PRESENT    Hyaline Casts, UA PRESENT     Vitals: Blood pressure 97/39, pulse 51, temperature 98.1 F (36.7 C), temperature source Oral, resp. rate 18, height 5' (1.524 m), weight 45.36 kg (100 lb), SpO2 93 %.  Risk to Self: Suicidal Ideation: No Suicidal Intent: No Is patient at risk for suicide?: No Suicidal Plan?: No Access to Means: No What has been your use of drugs/alcohol within the last 12 months?: None Reported How many times?: 0 Other Self Harm Risks: NA Triggers for Past Attempts:  (NA) Intentional Self Injurious Behavior: None Risk to Others: Homicidal Ideation: No Thoughts of Harm to Others: No Current Homicidal Intent: No Current Homicidal Plan: No Access to Homicidal Means:  No Identified Victim: NA History of harm to others?: No Assessment of Violence: None Noted Violent Behavior Description: NA Does patient have access to weapons?: No Criminal Charges Pending?: No Does patient have a court date: No Prior Inpatient Therapy: Prior Inpatient Therapy: Yes Prior Therapy Dates: Patient is unable to remember.  Prior Therapy Facilty/Provider(s): Patient is unable to remember  Reason for Treatment: Patient is unable to remember  Prior Outpatient Therapy: Prior Outpatient Therapy: No Prior Therapy Dates: NA Prior Therapy Facilty/Provider(s): NA Reason for Treatment: NA Does patient have an ACCT team?: No Does patient have Intensive In-House Services?  : No Does patient have [facility] services? : No Does patient have P4CC services?: No  Current Facility-Administered Medications  Medication Dose Route Frequency Provider Last Rate Last Dose  . nicotine (NICOTROL) 10 MG inhaler 1 continuous puffing  1 continuous puffing Inhalation PRN Loney Hering, MD       No current outpatient prescriptions on file.    Musculoskeletal: Strength & Muscle Tone: within normal limits Gait & Station: normal Patient leans: N/A  Psychiatric Specialty Exam: Physical Exam  Eyes:  +Anisocoria  Cardiovascular: Normal rate.     Review of Systems  Constitutional: Negative.   HENT: Negative.   Eyes: Negative.   Respiratory: Negative.   Cardiovascular: Negative.   Gastrointestinal: Negative.   Genitourinary: Negative.   Musculoskeletal: Negative.   Neurological: Negative.   Endo/Heme/Allergies: Negative.   Psychiatric/Behavioral: Positive for memory loss. Negative for depression, suicidal ideas, hallucinations and substance abuse. The patient is not nervous/anxious and does not have insomnia.     Blood pressure 97/39, pulse 51, temperature 98.1 F (36.7 C), temperature source Oral, resp. rate 18, height 5' (1.524 m), weight 45.36 kg (100 lb), SpO2 93 %.Body mass index is  19.53 kg/(m^2).  General Appearance: Disheveled  Eye Contact::  Good  Speech:  Blocked, Clear and Coherent and Normal Rate  Volume:  Normal  Mood:  Euthymic  Affect:  Full Range  Thought Process:  Disorganized, Irrelevant and perseverative  Orientation:  Other:  Oriented only to person.   Thought Content:  Negative  Suicidal Thoughts:  No  Homicidal Thoughts:  No  Memory:  Immediate;   Poor Recent;   Fair Remote;   Fair  Judgement:  Impaired  Insight:  Lacking  Psychomotor Activity:  Normal  Concentration:  Fair  Recall:  Poor  Fund of Knowledge:Fair  Language: Fair  Akathisia:  NA  Handed:  Left  AIMS (if indicated):     Assets:  Communication Skills Desire for Improvement Financial Resources/Insurance Housing Resilience Social Support  ADL's:  Intact  Cognition: Impaired,  Moderate  Sleep:      Medical Decision Making: New problem,  with additional work up planned, Review of Psycho-Social Stressors (1), Review or order clinical lab tests (1), Discuss test with performing physician (1), Review or order medicine tests (1) and Review of Medication Regimen & Side Effects (2)  Treatment Plan Summary: Traci Holmes is a 69 y.o. female with Major Neurocognitive Disorder, unspecified type due to evidence of significant decline from a previous level of performance in the domain mentioned in the HPI.  The pt deficits are interfering with daily living including taking her medications and eating. Due to recent mental status changes, her history of cancer, she had a hysterectomy, smoking and pupillary anisocoria, I have asked the ED Physician to order the following tests including B12, folate, TSH, CXR and a head CT. I will order a RPR as I forgot to mention this to ED Physician. Will reassess patient as results come in.   Plan:  Discussed crisis plan, support from social network, calling 911, coming to the Emergency Department, and calling Suicide Hotline. Awaiting test results.    1.  Will continue IVC at this time.  2. See Treatment plan summary for more details.   Disposition: Pending  Donita Brooks 08/25/2014 11:04 AM

## 2014-08-25 NOTE — ED Notes (Signed)
Patient assigned to appropriate care area. Patient oriented to unit/care area: Informed that, for their safety, care areas are designed for safety and monitored by security cameras at all times; and visiting hours explained to patient. Patient verbalizes understanding, and verbal contract for safety obtained. 

## 2014-08-25 NOTE — ED Notes (Signed)
BEHAVIORAL HEALTH ROUNDING Patient sleeping: No. Patient alert and oriented: awake Behavior appropriate: Yes.  ; If no, describe:  Nutrition and fluids offered: Yes  Toileting and hygiene offered: Yes  Sitter present: no Law enforcement present: Yes

## 2014-08-25 NOTE — ED Provider Notes (Signed)
[facility] Emergency Department Provider Note  ____________________________________________  Time seen: Approximately 0255 AM  I have reviewed the triage vital signs and the nursing notes.   HISTORY  Chief Complaint Medical Clearance    HPI Traci Holmes is a 69 y.o. female who was brought in by the police but the patient does not know why she was brought in. Per the nurse who spoke with the patient's family she has been having some increasing confusion, refusing to eat and not taking her medication. When asked if she hasn't been eating the patient reports that she has not been very hungry. She reports she has no complaints at this time. The patient does not have any thoughts of hurting herself or anyone else.She is not oriented to month and year but she does know who she is and where she is as well as who the president is. The patient was brought in under involuntary commitment.   Past medical history None There are no active problems to display for this patient.   past surgical history  Hysterectomy No current outpatient prescriptions on file.  Allergies Penicillin  No family history on file.  Social History History  Substance Use Topics  . Smoking status:  current smoker   . Smokeless tobacco: Not on file  . Alcohol Use:  denies     Review of Systems Constitutional: No fever/chills Eyes: No visual changes. ENT: No sore throat. Cardiovascular: Denies chest pain. Respiratory: Denies shortness of breath. Gastrointestinal: No abdominal pain.  No nausea, no vomiting.  No diarrhea.  No constipation. Genitourinary: Negative for dysuria. Musculoskeletal: Negative for back pain. Skin: Negative for rash. Neurological: Negative for headaches, focal weakness or numbness. Psychiatric:Increased confusion  10-point ROS otherwise negative.  ____________________________________________   PHYSICAL EXAM:  VITAL SIGNS: ED Triage Vitals  Enc  Vitals Group     BP 08/25/14 0143 134/47 mmHg     Pulse Rate 08/25/14 0143 51     Resp 08/25/14 0143 18     Temp 08/25/14 0143 98.2 F (36.8 C)     Temp Source 08/25/14 0143 Oral     SpO2 08/25/14 0143 98 %     Weight 08/25/14 0143 100 lb (45.36 kg)     Height 08/25/14 0143 5' (1.524 m)     Head Cir --      Peak Flow --      Pain Score 08/25/14 0146 0     Pain Loc --      Pain Edu? --      Excl. in [place]? --     Constitutional: Alert and mildly confused. Well appearing and in no acute distress. Eyes: Conjunctivae are normal. PERRL. EOMI. Head: Atraumatic. Nose: No congestion/rhinnorhea. Mouth/Throat: Mucous membranes are moist.  Oropharynx non-erythematous. Cardiovascular: Normal rate, regular rhythm. Grossly normal heart sounds.  Good peripheral circulation. Respiratory: Normal respiratory effort.  No retractions. Lungs CTAB. Gastrointestinal: Soft and nontender. No distention. Positive bowel sounds Genitourinary: deferred Musculoskeletal: No lower extremity tenderness nor edema.   Neurologic:  Normal speech and language. No gross focal neurologic deficits are appreciated.  Skin:  Skin is warm, dry and intact. No rash noted. Psychiatric: Mood and affect are normal.   ____________________________________________   LABS (all labs ordered are listed, but only abnormal results are displayed)  Labs Reviewed  ACETAMINOPHEN LEVEL - Abnormal; Notable for the following:    Acetaminophen (Tylenol), Serum <10 (*)    All other components within normal limits  CBC - Abnormal; Notable  for the following:    RDW 16.0 (*)    All other components within normal limits  COMPREHENSIVE METABOLIC PANEL - Abnormal; Notable for the following:    Potassium 3.3 (*)    Glucose, Bld 208 (*)    BUN 40 (*)    ALT 9 (*)    All other components within normal limits  URINE DRUG SCREEN, QUALITATIVE ([facility] ONLY) - Abnormal; Notable for the following:    Amphetamines, Ur Screen POSITIVE (*)    Opiate, Ur  Screen POSITIVE (*)    Phencyclidine (PCP) Ur S POSITIVE (*)    Cannabinoid 50 Ng, Ur [place] POSITIVE (*)    All other components within normal limits  URINALYSIS COMPLETEWITH MICROSCOPIC ([facility] ONLY) - Abnormal; Notable for the following:    Color, Urine YELLOW (*)    APPearance CLEAR (*)    Hgb urine dipstick 1+ (*)    Protein, ur 100 (*)    Bacteria, UA RARE (*)    Squamous Epithelial / LPF 0-5 (*)    All other components within normal limits  ETHANOL  SALICYLATE LEVEL   ____________________________________________  EKG  None ____________________________________________  RADIOLOGY  None ____________________________________________   PROCEDURES  Procedure(s) performed: None  Critical Care performed: No  ____________________________________________   INITIAL IMPRESSION / ASSESSMENT AND PLAN / ED COURSE  Pertinent labs & imaging results that were available during my care of the patient were reviewed by me and considered in my medical decision making (see chart for details).  The patient is a 69 year old female who was sent in by her family with increasing confusion refusing to eat and take her medications. The patient reports that she is fine. I will have the patient evaluated by psych as she is unsure exactly why she is here to determine if there is something that we may be able to do for her. Otherwise the patient appears well has no current complaints and will be evaluated. ____________________________________________   FINAL CLINICAL IMPRESSION(S) / ED DIAGNOSES  Final diagnoses:  Confusion      Loney Hering, MD 08/25/14 [phone]

## 2014-08-25 NOTE — ED Notes (Signed)
Patient taken to CT scan via stretcher by radiology staff.

## 2014-08-25 NOTE — BH Specialist Note (Signed)
Client information has been faxed to the following for placement consideration: [facility]; [place]; [facility]; [facility]; [facility]; [facility]; [facility]; and [facility].

## 2014-08-25 NOTE — BH Assessment (Addendum)
Assessment Note  Traci Holmes is an 69 y.o. female that was brought to the ED due to her son IVC the patient.  Per IVC documentation the patient has been forgetting to eat and take care of herself.  Patient reports that she lives and alone and is able to care for herself.  Patient reports that she does not know why she was brought to the emergency room.  Patient reports that she has not been eating because she was not hungry. Patient did not know what the year was or who the president was.  Patient was orientated to self and reported that she was in an emergency department.    Patient denies SI/HI/Psychosis/Substance Abuse.  Patient denies prior psychiatric hospitalizations.  Patient denies outpatient menta health treatment. Patient denies physical, sexual or emotional abuse.   Axis I: Adjustment Disorder NOS Axis II: Deferred Axis III: No past medical history on file. Axis IV: economic problems, occupational problems, other psychosocial or environmental problems, problems related to social environment, problems with access to health care services and problems with primary support group Axis V: 31-40 impairment in reality testing  Past Medical History: No past medical history on file.  No past surgical history on file.  Family History: No family history on file.  Social History:  has no tobacco, alcohol, and drug history on file.  Additional Social History:  Alcohol / Drug Use Pain Medications:  (None Reported)  CIWA: CIWA-Ar BP: (!) 134/47 mmHg Pulse Rate: (!) 51 COWS:    Allergies: No Known Allergies  Home Medications:  (Not in a hospital admission)  OB/GYN Status:  No LMP recorded. Patient has had a hysterectomy.  General Assessment Data Location of Assessment: [facility] ED TTS Assessment: In system Is this a Tele or Face-to-Face Assessment?: Face-to-Face Is this an Initial Assessment or a Re-assessment for this encounter?: Initial Assessment Marital status: Divorced [place]  name: Traci Holmes Is patient pregnant?: No Pregnancy Status: No Living Arrangements: Alone Can pt return to current living arrangement?: Yes Admission Status: Involuntary Is patient capable of signing voluntary admission?: No Referral Source: Self/Family/Friend Insurance type: Medicare  Medical Screening Exam ([place]) Medical Exam completed: Yes  Crisis Care Plan Living Arrangements: Alone Name of Psychiatrist: NA Name of Therapist: NA  Education Status Is patient currently in school?: No Current Grade: NA Highest grade of school patient has completed: NA Name of school: NA Contact person: NA  Risk to self with the past 6 months Suicidal Ideation: No Has patient been a risk to self within the past 6 months prior to admission? : No Suicidal Intent: No Has patient had any suicidal intent within the past 6 months prior to admission? : No Is patient at risk for suicide?: No Suicidal Plan?: No Has patient had any suicidal plan within the past 6 months prior to admission? : No Access to Means: No What has been your use of drugs/alcohol within the last 12 months?: None Reported Previous Attempts/Gestures: No How many times?: 0 Other Self Harm Risks: NA Triggers for Past Attempts:  (NA) Intentional Self Injurious Behavior: None Family Suicide History: No Recent stressful life event(s):  (None Reported) Persecutory voices/beliefs?: No Depression: Yes Depression Symptoms: Fatigue, Loss of interest in usual pleasures, Feeling worthless/self pity Substance abuse history and/or treatment for substance abuse?: No Suicide prevention information given to non-admitted patients: Not applicable  Risk to Others within the past 6 months Homicidal Ideation: No Does patient have any lifetime risk of violence toward others beyond the  six months prior to admission? : No Thoughts of Harm to Others: No Current Homicidal Intent: No Current Homicidal Plan: No Access to Homicidal Means:  No Identified Victim: NA History of harm to others?: No Assessment of Violence: None Noted Violent Behavior Description: NA Does patient have access to weapons?: No Criminal Charges Pending?: No Does patient have a court date: No Is patient on probation?: No  Psychosis Hallucinations: None noted Delusions: None noted  Mental Status Report Appearance/Hygiene: Disheveled Eye Contact: Good Motor Activity: Freedom of movement Speech: Soft, Slow Level of Consciousness: Alert Mood: Depressed, Anxious, Suspicious, Despair, Helpless Affect: Depressed Anxiety Level: Minimal Thought Processes: Coherent Judgement: Unimpaired Orientation: Person, Place, Time, Situation Obsessive Compulsive Thoughts/Behaviors: None  Cognitive Functioning Concentration: Decreased Memory: Recent Impaired, Remote Impaired IQ: Average Insight: Fair Impulse Control: Poor Appetite: Fair Weight Loss: 0 Weight Gain: 0 Sleep: Unable to Assess Total Hours of Sleep:  (UTA) Vegetative Symptoms: Unable to Assess  ADLScreening [facility] Assessment Services) Patient's cognitive ability adequate to safely complete daily activities?: Yes Patient able to express need for assistance with ADLs?: No Independently performs ADLs?: Yes (appropriate for developmental age)  Prior Inpatient Therapy Prior Inpatient Therapy: Yes Prior Therapy Dates: Patient is unable to remember.  Prior Therapy Facilty/Provider(s): Patient is unable to remember  Reason for Treatment: Patient is unable to remember   Prior Outpatient Therapy Prior Outpatient Therapy: No Prior Therapy Dates: NA Prior Therapy Facilty/Provider(s): NA Reason for Treatment: NA Does patient have an ACCT team?: No Does patient have Intensive In-House Services?  : No Does patient have [facility] services? : No Does patient have P4CC services?: No  ADL Screening (condition at time of admission) Patient's cognitive ability adequate to safely complete daily  activities?: Yes Is the patient deaf or have difficulty hearing?: No Does the patient have difficulty seeing, even when wearing glasses/contacts?: No Does the patient have difficulty concentrating, remembering, or making decisions?: Yes Patient able to express need for assistance with ADLs?: No Does the patient have difficulty dressing or bathing?: No Independently performs ADLs?: Yes (appropriate for developmental age) Does the patient have difficulty walking or climbing stairs?: No Weakness of Legs: None Weakness of Arms/Hands: None  Home Assistive Devices/Equipment Home Assistive Devices/Equipment: None  Therapy Consults (therapy consults require a physician order) PT Evaluation Needed: No OT Evalulation Needed: No SLP Evaluation Needed: No Abuse/Neglect Assessment (Assessment to be complete while patient is alone) Physical Abuse: Denies Verbal Abuse: Denies Sexual Abuse: Denies Exploitation of patient/patient's resources: Denies Self-Neglect: Denies Values / Beliefs Cultural Requests During Hospitalization: None Spiritual Requests During Hospitalization: None Consults Spiritual Care Consult Needed: No Social Work Consult Needed: No Regulatory affairs officer (For Healthcare) Does patient have an advance directive?: No Would patient like information on creating an advanced directive?: No - patient declined information    Additional Information 1:1 In Past 12 Months?: No CIRT Risk: No Elopement Risk: No Does patient have medical clearance?: Yes     Disposition: Pending final psych disposition.  Disposition Initial Assessment Completed for this Encounter: Yes Disposition of Patient: Other dispositions Other disposition(s):  (Pending psych disposition.)  On Site Evaluation by:   Reviewed with Physician:    Graciella Freer [place] 08/25/2014 4:32 AM

## 2014-08-25 NOTE — ED Notes (Signed)
Pt presented with nicotrol inhaler, pt walked in to [facility] and stated "shove that thing up your ass", this RN and ODS escorted pt back to room, back back to hallway politly asking for meal, meal provided, no apparent recollection of abusive speech

## 2014-08-25 NOTE — ED Notes (Signed)
BEHAVIORAL HEALTH ROUNDING Patient sleeping: No. Patient alert and oriented: no Behavior appropriate: No.; If no, describe: demented and confused Nutrition and fluids offered: Yes  Toileting and hygiene offered: Yes  Sitter present: no Law enforcement present: Yes  and ODS  ENVIRONMENTAL ASSESSMENT Potentially harmful objects out of patient reach: Yes.   Personal belongings secured: Yes.   Patient dressed in hospital provided attire only: Yes.   Plastic bags out of patient reach: Yes.   Patient care equipment (cords, cables, call bells, lines, and drains) shortened, removed, or accounted for: Yes.   Equipment and supplies removed from bottom of stretcher: Yes.   Potentially toxic materials out of patient reach: Yes.   Sharps container removed or out of patient reach: Yes.

## 2014-08-26 MED ORDER — ACETAMINOPHEN 325 MG PO TABS
ORAL_TABLET | ORAL | Status: AC
  Administered 2014-08-26: 650 mg
  Filled 2014-08-26: qty 2

## 2014-08-26 NOTE — ED Notes (Signed)
Pt ate dinner tray.   Calm and cooperative.

## 2014-08-26 NOTE — ED Notes (Signed)
BEHAVIORAL HEALTH ROUNDING Patient sleeping: YES Patient alert and oriented: YES Behavior appropriate: YES Describe behavior: No inappropriate or unacceptable behaviors noted at this time.  Nutrition and fluids offered: YES Toileting and hygiene offered: YES Sitter present: Industrial/product designer rounding every 15 minutes on patient to ensure safety.  Law enforcement present: Programmer, applications agency: [place] (ODS)

## 2014-08-26 NOTE — ED Notes (Signed)
Patient up to bathroom with no assistance, ambulated with steady gait, denies any complaints at this time.

## 2014-08-26 NOTE — Progress Notes (Signed)
Attempted to secure placement at the following facilities:   [facility]- faxed [facility]- faxed [facility]- faxed [facility]- faxed [facility]- faxed [facility]- faxed [facility]- faxed [facility]- faxed [facility]- faxed [facility]- faxed   Chesley Noon, MSW, LCSW, [place] Social Worker [phone]

## 2014-08-26 NOTE — ED Notes (Signed)
BEHAVIORAL HEALTH ROUNDING Patient sleeping: NO Patient alert and oriented: YES Behavior appropriate: YES Describe behavior: No inappropriate or unacceptable behaviors noted at this time.  Nutrition and fluids offered: YES Toileting and hygiene offered: YES Sitter present: Industrial/product designer rounding every 15 minutes on patient to ensure safety.  Law enforcement present: Programmer, applications agency: [place] (ODS)

## 2014-08-26 NOTE — ED Notes (Signed)
BEHAVIORAL HEALTH ROUNDING Patient sleeping: No. Patient alert and oriented: yes Behavior appropriate: Yes.  ; If no, describe:  Nutrition and fluids offered: Yes  Toileting and hygiene offered: Yes  Sitter present: yes Law enforcement present: Yes  

## 2014-08-26 NOTE — ED Notes (Signed)
BEHAVIORAL HEALTH ROUNDING Patient sleeping: YES Patient alert and oriented: NO Behavior appropriate: YES Describe behavior: No inappropriate or unacceptable behaviors noted at this time.  Nutrition and fluids offered: NO Toileting and hygiene offered: NO Sitter present: Behavioral tech rounding every 15 minutes on patient to ensure safety.  Law enforcement present: Programmer, applications agency: [place] (ODS)

## 2014-08-26 NOTE — ED Notes (Signed)

## 2014-08-26 NOTE — ED Notes (Signed)
BEHAVIORAL HEALTH ROUNDING Patient sleeping: NO Patient alert and oriented: YES Behavior appropriate: YES Describe behavior: No inappropriate or unacceptable behaviors noted at this time.  Nutrition and fluids offered: YES Toileting and hygiene offered: YES Sitter present: Industrial/product designer rounding every 15 minutes on patient to ensure safety.  Law enforcement present: Programmer, applications agency: [place] (ODS)

## 2014-08-26 NOTE — ED Notes (Signed)
BEHAVIORAL HEALTH ROUNDING Patient sleeping: YES Patient alert and oriented: YES Behavior appropriate: YES Describe behavior: No inappropriate or unacceptable behaviors noted at this time.  Nutrition and fluids offered: YES Toileting and hygiene offered: YES Sitter present: Industrial/product designer rounding every 15 minutes on patient to ensure safety.  Law enforcement present: Programmer, applications agency: [place] (ODS)

## 2014-08-26 NOTE — ED Notes (Signed)
BEHAVIORAL HEALTH ROUNDING Patient sleeping: Yes.   Patient alert and oriented: yes Behavior appropriate: Yes.  ; If no, describe:  Nutrition and fluids offered: Yes  Toileting and hygiene offered: Yes  Sitter present: yes Law enforcement present: Yes  

## 2014-08-26 NOTE — ED Notes (Signed)
BEHAVIORAL HEALTH ROUNDING Patient sleeping: NO Patient alert and oriented: YES Behavior appropriate: YES Describe behavior: No inappropriate or unacceptable behaviors noted at this time.  Nutrition and fluids offered: YES Toileting and hygiene offered: YES Sitter present: Industrial/product designer rounding every 15 minutes on patient to ensure safety.  Law enforcement present: Programmer, applications agency: [place] (ODS)

## 2014-08-26 NOTE — ED Notes (Signed)
Pt transferred to room 22 due to other patients requiring [facility] consults tonight, this pt does not require an [facility] consult. The [place] connection in room 22 is non-functional.

## 2014-08-26 NOTE — ED Notes (Addendum)
BEHAVIORAL HEALTH ROUNDING Patient sleeping: YES Patient alert and oriented: YES Behavior appropriate: YES Describe behavior: No inappropriate or unacceptable behaviors noted at this time.  Nutrition and fluids offered: YES Toileting and hygiene offered: YES Sitter present: Industrial/product designer rounding every 15 minutes on patient to ensure safety.  Law enforcement present: Programmer, applications agency: [place] (ODS)

## 2014-08-26 NOTE — ED Notes (Addendum)
Pt has visitor at bedside. Pt requesting her keys be given to visitor d/t her dogs in her home need care.  Keys given to visitor in front of pt.

## 2014-08-26 NOTE — ED Notes (Signed)
BEHAVIORAL HEALTH ROUNDING Patient sleeping: No. Patient alert and oriented: yes Behavior appropriate: Yes.   Nutrition and fluids offered: Yes  Toileting and hygiene offered: Yes  Sitter present: q15 min observations Law enforcement present: Yes [employer] [place] 

## 2014-08-26 NOTE — ED Notes (Addendum)
BEHAVIORAL HEALTH ROUNDING Patient sleeping: No. Patient alert and oriented: yes Behavior appropriate: Yes.   Nutrition and fluids offered: Yes  Toileting and hygiene offered: Yes  Sitter present: q15 min observations Law enforcement present: Yes [employer]  ENVIRONMENTAL ASSESSMENT Potentially harmful objects out of patient reach: Yes.   Personal belongings secured: Yes.   Patient dressed in hospital provided attire only: Yes.   Plastic bags out of patient reach: Yes.   Patient care equipment (cords, cables, call bells, lines, and drains) shortened, removed, or accounted for: Yes.   Equipment and supplies removed from bottom of stretcher: Yes.   Potentially toxic materials out of patient reach: Yes.   Sharps container removed or out of patient reach: Yes.    ED BHU [place] Is the patient under IVC or is there intent for IVC: Yes.    Is IVC current? Yes Is the patient medically cleared: Yes.   Is there vacancy in the ED BHU: Yes.   Is the population mix appropriate for patient: No, pt is confused need frequent reoriientation Is the patient awaiting placement in inpatient or outpatient setting: unknown at this time Has the patient had a psychiatric consult: No   Survey of unit performed for contraband, proper placement and condition of furniture, tampering with fixtures in bathroom, shower, and each patient room: Yes.  ; Findings: none APPEARANCE/BEHAVIOR Cooperative,calm NEURO ASSESSMENT Orientation: person Hallucinations: None noted at this time Speech: Normal Gait: normal RESPIRATORY ASSESSMENT Breathing Pattern-regular, no respiratory distress noted CARDIOVASCULAR ASSESSMENT Skin color appropriate for age and race GASTROINTESTINAL ASSESSMENT no GI distress noted EXTREMITIES Moves all extremities, no distress noted PLAN OF CARE Provide calm/safe environment. Vital signs assessed twice daily. ED BHU Assessment once each 12-hour shift. Collaborate with  intake RN daily or as condition indicates. Assure the ED provider has rounded once each shift. Provide and encourage hygiene. Provide redirection as needed. Assess for escalating behavior; address immediately and inform ED provider.  Assess family dynamic and appropriateness for visitation as needed: Yes. Educate the patient/family about BHU procedures/visitation: Yes.

## 2014-08-26 NOTE — ED Notes (Signed)
BEHAVIORAL HEALTH ROUNDING Patient sleeping: YES Patient alert and oriented: YES Behavior appropriate: YES Describe behavior: No inappropriate or unacceptable behaviors noted at this time.  Nutrition and fluids offered: YES Toileting and hygiene offered: YES Sitter present: Industrial/product designer rounding every 15 minutes on patient to ensure safety.  Law enforcement present: Programmer, applications agency: [place] (ODS)

## 2014-08-26 NOTE — ED Notes (Signed)
Pt currently watching TV without complaints.

## 2014-08-26 NOTE — ED Notes (Addendum)
Patient resting with eyes closed, lights and TV turned off for comfort. Respirations even and unlabored. NAD noted, will continue to monitor.

## 2014-08-26 NOTE — ED Notes (Signed)

## 2014-08-26 NOTE — ED Provider Notes (Signed)
-----------------------------------------   7:06 AM on 08/26/2014 -----------------------------------------   BP 112/49 mmHg  Pulse 47  Temp(Src) 98.2 F (36.8 C) (Oral)  Resp 16  Ht 5' (1.524 m)  Wt 100 lb (45.36 kg)  BMI 19.53 kg/m2  SpO2 94%  The patient had no acute events since last update.  Behavioral Medicine is seeking placement in a psychiatric facility.    Hinda Kehr, MD 08/26/14 [phone]

## 2014-08-26 NOTE — ED Notes (Signed)
Resumed care from ricky rn.  Pt sleeping.

## 2014-08-27 LAB — RPR: RPR Ser Ql: NONREACTIVE

## 2014-08-27 MED ORDER — ACETAMINOPHEN 325 MG PO TABS
ORAL_TABLET | ORAL | Status: AC
  Administered 2014-08-27: 650 mg via ORAL
  Filled 2014-08-27: qty 2

## 2014-08-27 MED ORDER — ACETAMINOPHEN 325 MG PO TABS
650.0000 mg | ORAL_TABLET | Freq: Once | ORAL | Status: AC
  Administered 2014-08-27: 650 mg via ORAL

## 2014-08-27 NOTE — ED Notes (Signed)
Pt resting in bed watching tv. No unusual behavior observed. Pt has no needs or concerns at this time. Will continue to monitor and f/u as needed.

## 2014-08-27 NOTE — ED Notes (Signed)
BEHAVIORAL HEALTH ROUNDING Patient sleeping: No. Patient alert and oriented: yes, oriented to person, place Behavior appropriate: Yes.  ; If no, describe:  Nutrition and fluids offered: Yes  Toileting and hygiene offered: Yes  Sitter present: no Law enforcement present: Yes, ODS

## 2014-08-27 NOTE — ED Notes (Signed)

## 2014-08-27 NOTE — ED Notes (Signed)
BEHAVIORAL HEALTH ROUNDING Patient sleeping: No. Patient alert and oriented: yes, person, place Behavior appropriate: Yes.  ; If no, describe:  Nutrition and fluids offered: Yes  Toileting and hygiene offered: Yes  Sitter present: no Law enforcement present: Yes, [place]

## 2014-08-27 NOTE — ED Notes (Signed)
Pt c/o headache. MD notified. See MAR.

## 2014-08-27 NOTE — ED Notes (Addendum)
Pt given her purse with this RN and nurse tech present to find her sisters phone number. Pt wants to call sister Traci Holmes but cannot remember her phone number. Pt located Traci Holmes's number, sister was called and spoke with pt. Pt's purse was locked up again by this RN. Traci Holmes's information added to contacts.

## 2014-08-27 NOTE — ED Notes (Signed)
BEHAVIORAL HEALTH ROUNDING Patient sleeping: Yes.   Patient alert and oriented: not applicable Behavior appropriate: Yes.    Nutrition and fluids offered: No Toileting and hygiene offered: No Sitter present: q15 minute observations Law enforcement present: Yes [employer] 

## 2014-08-27 NOTE — ED Provider Notes (Signed)
-----------------------------------------   4:58 AM on 08/27/2014 -----------------------------------------   BP 109/42 mmHg  Pulse 62  Temp(Src) 98.2 F (36.8 C) (Oral)  Resp 20  Ht 5' (1.524 m)  Wt 100 lb (45.36 kg)  BMI 19.53 kg/m2  SpO2 94%  The patient had no acute events since last update.  Calm and cooperative at this time.  Disposition is pending per Psychiatry/Behavioral Medicine team recommendations.     Ruby Cola, MD 08/27/14 [phone]

## 2014-08-27 NOTE — ED Notes (Signed)
Pt. Resting in bed. Pt given a update about plan of care. Pt states "i just want to go home". Pt remained calm and cooperative.

## 2014-08-27 NOTE — ED Notes (Signed)
Pt resting in bed with eyes closed. No unusual behavior observed. Pt has no needs or concerns at this time. Will continue to monitor and f/u as needed.

## 2014-08-27 NOTE — BHH Counselor (Signed)
Followed up with Referrals for the Inpatient:  [facility]- [phone] a message   Clide Deutscher- faxed  Rosana Hoes- faxed  Mikel Cella- faxed  [facility]- faxed  Boykin Nearing- faxed  Bonna Gains- faxed  Cristal Ford- faxed  Autumn Patty faxed  [facility]- faxed

## 2014-08-27 NOTE — ED Notes (Signed)
BEHAVIORAL HEALTH ROUNDING Patient sleeping: No. Patient alert and oriented: yes Behavior appropriate: Yes.   Nutrition and fluids offered: Yes  Toileting and hygiene offered: Yes  Sitter present: 15 min checks  Law enforcement present: Yes

## 2014-08-27 NOTE — ED Notes (Signed)
BEHAVIORAL HEALTH ROUNDING Patient sleeping: No. Patient alert and oriented: yes Behavior appropriate: Yes.  ; If no, describe:  Nutrition and fluids offered: No Toileting and hygiene offered: Yes  Sitter present: no Law enforcement present: Yes

## 2014-08-27 NOTE — ED Notes (Signed)
Pt provided with sandwich tray and drink.

## 2014-08-27 NOTE — ED Notes (Signed)
Pt report received from [facility]. Pt care assumed. Pt resting in bed watching tv.

## 2014-08-27 NOTE — ED Notes (Signed)
Pt provided with dinner tray and drink.

## 2014-08-27 NOTE — ED Notes (Signed)
Breakfast tray given to pt at this time.

## 2014-08-27 NOTE — ED Notes (Signed)
BEHAVIORAL HEALTH ROUNDING Patient sleeping: No. Patient alert and oriented: yes, oriented to self. Behavior appropriate: Yes.  ; If no, describe:  Nutrition and fluids offered: Yes  Toileting and hygiene offered: Yes  Sitter present: no Law enforcement present: Yes

## 2014-08-28 DIAGNOSIS — F0390 Unspecified dementia without behavioral disturbance: Secondary | ICD-10-CM | POA: Diagnosis not present

## 2014-08-28 MED ORDER — MEMANTINE HCL 5 MG PO TABS
5.0000 mg | ORAL_TABLET | Freq: Every day | ORAL | Status: DC
  Administered 2014-08-28: 5 mg via ORAL
  Filled 2014-08-28: qty 1

## 2014-08-28 NOTE — Consult Note (Signed)
[facility] Face-to-Face Psychiatry Consult    Patient Identification: Traci Holmes MRN:  [id number] Principal Diagnosis: Major neurocognitive disorder, unspecified Diagnosis:   Patient Active Problem List   Diagnosis Date Noted  . Major neurocognitive disorder, unspecified [F99] 08/25/2014    Total Time spent with patient: 30 minutes  Subjective:   Traci Holmes is a 69 y.o. female patient seen for follow-up. She reported that she has been in the hospital for the past 2 weeks. However patient was admitted only 3 days ago. She appeared somewhat confused and was unable to tell me the reason for admission. She reported that the police came to her house  and brought her to the hospital. Patient reported that she is able to drive to [employer] to bring her groceries. She reported that one of her son [name] lives in [place] and other son Merry Proud lives in [place]. Reported that Merry Proud is disabled and is unable to take care of himself. [name]  is trying to take away her house. She appeared concerned about the same. Patient reported that she does not take any medications. Later she reported that she fell and it was a reason for her admission. She stated that her  sister lives close by and she  can stay with her. Patient currently denied having any suicidal homicidal ideations or plans. She denied having any perceptual disturbances.   HPI Elements:   Location:  acute.  Past Medical History:  Past Medical History  Diagnosis Date  . Major neurocognitive disorder, unspecified 08/25/2014   No past surgical history on file. Family History: No family history on file. Social History:  History  Alcohol Use: Not on file     History  Drug Use Not on file    History   Social History  . Marital Status: Married    Spouse Name: N/A  . Number of Children: N/A  . Years of Education: N/A   Social History Main Topics  . Smoking status: Not on file  . Smokeless tobacco: Not on file  . Alcohol Use: Not on file  . Drug  Use: Not on file  . Sexual Activity: Not on file   Other Topics Concern  . Not on file   Social History Narrative  . No narrative on file   Additional Social History:  She currently lives by herself. She has 2 sons and one is disabled.    Pain Medications:  (None Reported)                     Allergies:   Allergies  Allergen Reactions  . Penicillins     Unknown reaction    Labs: No results found for this or any previous visit (from the past 48 hour(s)).  Vitals: Blood pressure 113/60, pulse 52, temperature 98.4 F (36.9 C), temperature source Oral, resp. rate 18, height 5' (1.524 m), weight 100 lb (45.36 kg), SpO2 97 %.  Risk to Self: Suicidal Ideation: No Suicidal Intent: No Is patient at risk for suicide?: No Suicidal Plan?: No Access to Means: No What has been your use of drugs/alcohol within the last 12 months?: None Reported How many times?: 0 Other Self Harm Risks: NA Triggers for Past Attempts:  (NA) Intentional Self Injurious Behavior: None Risk to Others: Homicidal Ideation: No Thoughts of Harm to Others: No Current Homicidal Intent: No Current Homicidal Plan: No Access to Homicidal Means: No Identified Victim: NA History of harm to others?: No Assessment of Violence: None Noted  Violent Behavior Description: NA Does patient have access to weapons?: No Criminal Charges Pending?: No Does patient have a court date: No Prior Inpatient Therapy: Prior Inpatient Therapy: Yes Prior Therapy Dates: Patient is unable to remember.  Prior Therapy Facilty/Provider(s): Patient is unable to remember  Reason for Treatment: Patient is unable to remember  Prior Outpatient Therapy: Prior Outpatient Therapy: No Prior Therapy Dates: NA Prior Therapy Facilty/Provider(s): NA Reason for Treatment: NA Does patient have an ACCT team?: No Does patient have Intensive In-House Services?  : No Does patient have [facility] services? : No Does patient have P4CC services?:  No  Current Facility-Administered Medications  Medication Dose Route Frequency Provider Last Rate Last Dose  . memantine (NAMENDA) tablet 5 mg  5 mg Oral Daily Rainey Pines, MD      . nicotine (NICOTROL) 10 MG inhaler 1 continuous puffing  1 continuous puffing Inhalation PRN Loney Hering, MD       No current outpatient prescriptions on file.    Musculoskeletal: Strength & Muscle Tone: within normal limits Gait & Station: normal Patient leans: N/A  Psychiatric Specialty Exam: Physical Exam  Review of Systems  Constitutional: Negative for malaise/fatigue.  HENT: Negative for tinnitus.   Respiratory: Negative for hemoptysis.   Cardiovascular: Negative for claudication.  Gastrointestinal: Negative for diarrhea.  Musculoskeletal: Negative for neck pain.  Skin: Negative for itching.  Neurological: Negative for tingling.  Psychiatric/Behavioral: Positive for memory loss. Negative for depression and suicidal ideas. The patient is nervous/anxious.     Blood pressure 113/60, pulse 52, temperature 98.4 F (36.9 C), temperature source Oral, resp. rate 18, height 5' (1.524 m), weight 100 lb (45.36 kg), SpO2 97 %.Body mass index is 19.53 kg/(m^2).  General Appearance: Casual  Eye Contact::  Fair  Speech:  Slow  Volume:  Decreased  Mood:  Anxious  Affect:  Congruent  Thought Process:  Circumstantial  Orientation:  Other:  awake, alert  Thought Content:  WDL  Suicidal Thoughts:  No  Homicidal Thoughts:  No  Memory:  impaired  Judgement:  Impaired  Insight:  Lacking  Psychomotor Activity:  Decreased  Concentration:  Poor  Recall:  Poor  Fund of Knowledge:Poor  Language: Fair  Akathisia:  No  Handed:  Right  AIMS (if indicated):     Assets:  Communication Skills Housing  ADL's:  Intact  Cognition: Impaired,  Mild  Sleep:      Medical Decision Making: New problem, with additional work up planned  Treatment Plan Summary: Medication management  Plan:  Continue with the  current plan Disposition:  Discussed with patient about the about the plan for placement. She will try to call her sister to see if she can stay with her She is also on involuntary commitment for geropsychiatric placement at this time I will start her on Namenda to help with her dementia     This note was generated in part or whole with voice recognition software. Voice regonition is usually quite accurate but there are transcription errors that can and very often do occur. I apologize for any typographical errors that were not detected and corrected.   Rainey Pines 08/28/2014 12:14 PM

## 2014-08-28 NOTE — ED Notes (Signed)
BEHAVIORAL HEALTH ROUNDING Patient sleeping: No. Patient alert and oriented: awake Behavior appropriate: No.; If no, describe: wants to go home to check on her animals but does not want her son notified Nutrition and fluids offered: Yes  Toileting and hygiene offered: Yes  Sitter present: no Law enforcement present: Yes

## 2014-08-28 NOTE — ED Notes (Signed)
Pt resting in bed with eyes closed. No unusual behavior observed. Pt has no needs or concerns at this time. Will continue to monitor and f/u as needed.

## 2014-08-28 NOTE — ED Notes (Signed)
BEHAVIORAL HEALTH ROUNDING Patient sleeping: Yes.   Patient alert and oriented: awake Behavior appropriate: Yes.  ; If no, describe:  Nutrition and fluids offered: Yes  Toileting and hygiene offered: Yes  Sitter present: no Law enforcement present: Yes

## 2014-08-28 NOTE — ED Notes (Signed)
BEHAVIORAL HEALTH ROUNDING Patient sleeping: YES Patient alert and oriented: YES Behavior appropriate: YES Describe behavior: No inappropriate or unacceptable behaviors noted at this time.  Nutrition and fluids offered: YES Toileting and hygiene offered: YES Sitter present: Industrial/product designer rounding every 15 minutes on patient to ensure safety.  Law enforcement present: Programmer, applications agency: [place] ([place])

## 2014-08-28 NOTE — ED Notes (Signed)
Talked with pt son from [facility] about pt pets, he said they were taken to the shelter and will be killed in a week

## 2014-08-28 NOTE — BHH Counselor (Signed)
Received phone call from patient's Niece ([phone]) stating she will be at the ER to the patient up at approximately 5:15pm. She also reported she spoke with the son ([phone]) and he stated no one has called him. When Probation officer finish talking with the niece, he called the son and still was unable to reach him. Writer left a message for a return phone call.

## 2014-08-28 NOTE — ED Notes (Signed)
Psych md with pt

## 2014-08-28 NOTE — BHH Counselor (Signed)
Called and spoke with patient sister ([phone]) about patient discharging and staying with her for a short period of time. Sister stated, the patient talked with her daughter and do not know the extent of the conversation. So will "touch basis" with her daughter and call writer back.   Patient declined at [facility] [address] [phone]) due to "patient do not meet criteria for Inpatient."

## 2014-08-28 NOTE — Discharge Instructions (Signed)
It is very important to follow-up with your primary care provider as soon as possible.    Dementia Dementia is a general term for problems with brain function. A person with dementia has memory loss and a hard time with at least one other brain function such as thinking, speaking, or problem solving. Dementia can affect social functioning, how you do your job, your mood, or your personality. The changes may be hidden for a long time. The earliest forms of this disease are usually not detected by family or friends. Dementia can be:  Irreversible.  Potentially reversible.  Partially reversible.  Progressive. This means it can get worse over time. CAUSES  Irreversible dementia causes may include:  Degeneration of brain cells (Alzheimer disease or Lewy body dementia).  Multiple small strokes (vascular dementia).  Infection (chronic meningitis or Creutzfeldt-Jakob disease).  Frontotemporal dementia. This affects younger people, age 28 to 14, compared to those who have Alzheimer disease.  Dementia associated with other disorders like Parkinson disease, Huntington disease, or HIV-associated dementia. Potentially or partially reversible dementia causes may include:  Medicines.  Metabolic causes such as excessive alcohol intake, vitamin B12 deficiency, or thyroid disease.  Masses or pressure in the brain such as a tumor, blood clot, or hydrocephalus. SIGNS AND SYMPTOMS  Symptoms are often hard to detect. Family members or coworkers may not notice them early in the disease process. Different people with dementia may have different symptoms. Symptoms can include:  A hard time with memory, especially recent memory. Long-term memory may not be impaired.  Asking the same question multiple times or forgetting something someone just said.  A hard time speaking your thoughts or finding certain words.  A hard time solving problems or performing familiar tasks (such as how to use a  telephone).  Sudden changes in mood.  Changes in personality, especially increasing moodiness or mistrust.  Depression.  A hard time understanding complex ideas that were never a problem in the past. DIAGNOSIS  There are no specific tests for dementia.   Your health care provider may recommend a thorough evaluation. This is because some forms of dementia can be reversible. The evaluation will likely include a physical exam and getting a detailed history from you and a family member. The history often gives the best clues and suggestions for a diagnosis.  Memory testing may be done. A detailed brain function evaluation called neuropsychologic testing may be helpful.  Lab tests and brain imaging (such as a CT scan or MRI scan) are sometimes important.  Sometimes observation and re-evaluation over time is very helpful. TREATMENT  Treatment depends on the cause.   If the problem is a vitamin deficiency, it may be helped or cured with supplements.  For dementias such as Alzheimer disease, medicines are available to stabilize or slow the course of the disease. There are no cures for this type of dementia.  Your health care provider can help direct you to groups, organizations, and other health care providers to help with decisions in the care of you or your loved one. HOME CARE INSTRUCTIONS The care of individuals with dementia is varied and dependent upon the progression of the dementia. The following suggestions are intended for the person living with, or caring for, the person with dementia.  Create a safe environment.  Remove the locks on bathroom doors to prevent the person from accidentally locking himself or herself in.  Use childproof latches on kitchen cabinets and any place where cleaning supplies, chemicals, or alcohol are  kept.  Use childproof covers in unused electrical outlets.  Install childproof devices to keep doors and windows secured.  Remove stove knobs or  install safety knobs and an automatic shut-off on the stove.  Lower the temperature on water heaters.  Label medicines and keep them locked up.  Secure knives, lighters, matches, power tools, and guns, and keep these items out of reach.  Keep the house free from clutter. Remove rugs or anything that might contribute to a fall.  Remove objects that might break and hurt the person.  Make sure lighting is good, both inside and outside.  Install grab rails as needed.  Use a monitoring device to alert you to falls or other needs for help.  Reduce confusion.  Keep familiar objects and people around.  Use night lights or dim lights at night.  Label items or areas.  Use reminders, notes, or directions for daily activities or tasks.  Keep a simple, consistent routine for waking, meals, bathing, dressing, and bedtime.  Create a calm, quiet environment.  Place large clocks and calendars prominently.  Display emergency numbers and home address near all telephones.  Use cues to establish different times of the day. An example is to open curtains to let the natural light in during the day.   Use effective communication.  Choose simple words and short sentences.  Use a gentle, calm tone of voice.  Be careful not to interrupt.  If the person is struggling to find a word or communicate a thought, try to provide the word or thought.  Ask one question at a time. Allow the person ample time to answer questions. Repeat the question again if the person does not respond.  Reduce nighttime restlessness.  Provide a comfortable bed.  Have a consistent nighttime routine.  Ensure a regular walking or physical activity schedule. Involve the person in daily activities as much as possible.  Limit napping during the day.  Limit caffeine.  Attend social events that stimulate rather than overwhelm the senses.  Encourage good nutrition and hydration.  Reduce distractions during meal  times and snacks.  Avoid foods that are too hot or too cold.  Monitor chewing and swallowing ability.  Continue with routine vision, hearing, dental, and medical screenings.  Give medicines only as directed by the health care provider.  Monitor driving abilities. Do not allow the person to drive when safe driving is no longer possible.  Register with an identification program which could provide location assistance in the event of a missing person situation. SEEK MEDICAL CARE IF:   New behavioral problems start such as moodiness, aggressiveness, or seeing things that are not there (hallucinations).  Any new problem with brain function happens. This includes problems with balance, speech, or falling a lot.  Problems with swallowing develop.  Any symptoms of other illness happen. Small changes or worsening in any aspect of brain function can be a sign that the illness is getting worse. It can also be a sign of another medical illness such as infection. Seeing a health care provider right away is important. SEEK IMMEDIATE MEDICAL CARE IF:   A fever develops.  New or worsened confusion develops.  New or worsened sleepiness develops.  Staying awake becomes hard to do. Document Released: 08/03/2000 Document Revised: 06/24/2013 Document Reviewed: 07/05/2010 [facility] Patient Information 2015 [place], [place]. This information is not intended to replace advice given to you by your health care provider. Make sure you discuss any questions you have with your health  care provider.  

## 2014-08-28 NOTE — BHH Counselor (Signed)
Spoke with patient niece ([phone]), she states she is able to pick the patient up at 4:30pm-5:00pm. She asked if someone can contact the patient's son. The son has her house key's and she would rather for him to pick her up.  Writer made attempts to contact son ([phone]) but was unable to reach him.  Writer updated patient nurse (IT) ER Sect Levada Dy) and ER MD (Dr. Marlon Pel)

## 2014-08-28 NOTE — ED Notes (Signed)
BEHAVIORAL HEALTH ROUNDING Patient sleeping: No. Patient alert and oriented: no Behavior appropriate: Yes.  ; If no, describe:  Nutrition and fluids offered: Yes  Toileting and hygiene offered: Yes  Sitter present: no Law enforcement present: Yes

## 2014-08-28 NOTE — ED Notes (Signed)

## 2014-08-28 NOTE — ED Notes (Signed)

## 2014-08-28 NOTE — ED Notes (Signed)
Pt resting.

## 2014-08-28 NOTE — ED Notes (Signed)
Niece communicated to [facility] (TTS) that she will be arriving her at 4:35pm to pick this pt up following discharge.

## 2014-08-28 NOTE — ED Notes (Signed)
BEHAVIORAL HEALTH ROUNDING  Patient sleeping: NO  Patient alert and oriented: YES  Behavior appropriate: Yes. ; If no, describe:  Nutrition and fluids offered: YES  Toileting and hygiene offered: YES  Sitter present: YES  Event organiser present: YES

## 2014-08-28 NOTE — ED Notes (Signed)
BEHAVIORAL HEALTH ROUNDING Patient sleeping: Yes.   Patient alert and oriented: asleep Behavior appropriate: Yes.  ; If no, describe:  Nutrition and fluids offered: asleep Toileting and hygiene offered: asleep Sitter present: no Law enforcement present: Yes, [facility]

## 2014-08-28 NOTE — ED Notes (Signed)
Message left  With pt sister to call er

## 2014-08-28 NOTE — ED Notes (Signed)
BEHAVIORAL HEALTH ROUNDING Patient sleeping: YES Patient alert and oriented: YES Behavior appropriate: YES Describe behavior: No inappropriate or unacceptable behaviors noted at this time.  Nutrition and fluids offered: YES Toileting and hygiene offered: YES Sitter present: Industrial/product designer rounding every 15 minutes on patient to ensure safety.  Law enforcement present: Programmer, applications agency: [place] (ODS)

## 2014-08-28 NOTE — ED Notes (Signed)
Patient assigned to appropriate care area. Patient oriented to unit/care area: Informed that, for their safety, care areas are designed for safety and monitored by security cameras at all times; and visiting hours explained to patient. Patient verbalizes understanding, and verbal contract for safety obtained. 

## 2014-08-28 NOTE — ED Notes (Signed)
ED B[facility] Is the patient under IVC or is there intent for IVC: Yes.   Is the patient medically cleared: Yes.   Is there vacancy in the ED BHU: Yes.   pt not bhu app Is the population mix appropriate for patient: Yes.   Is the patient awaiting placement in inpatient or outpatient setting: Yes.   Has the patient had a psychiatric consult: Yes.   Survey of unit performed for contraband, proper placement and condition of furniture, tampering with fixtures in bathroom, shower, and each patient room: Yes.  ; Findings:  APPEARANCE/BEHAVIOR cooperative NEURO ASSESSMENT Orientation: person, place Hallucinations: No.None noted (Hallucinations) Speech: Normal Gait: normal RESPIRATORY ASSESSMENT wnl CARDIOVASCULAR ASSESSMENT wnl GASTROINTESTINAL ASSESSMENT wnl EXTREMITIES Moves all extremities  PLAN OF CARE Provide calm/safe environment. Vital signs assessed twice daily. ED BHU Assessment once each 12-hour shift. Collaborate with intake RN daily or as condition indicates. Assure the ED provider has rounded once each shift. Provide and encourage hygiene. Provide redirection as needed. Assess for escalating behavior; address immediately and inform ED provider.  Assess family dynamic and appropriateness for visitation as needed: Yes.  ; If necessary, describe findings:  Educate the patient/family about BHU procedures/visitation: Yes.  ; If necessary, describe findings:

## 2014-08-28 NOTE — ED Provider Notes (Signed)
-----------------------------------------   5:00 PM on 08/28/2014 -----------------------------------------  Dr. Gretel Acre has released IVC commitment on patient earlier today; he feels symptoms are due to dementia and has started Namenda. Calvin, behavioral med RN, has been in touch with the family and has found a niece, Conner Neiss, who is able to pick Ms. Job up  Ponciano Ort, MD 08/28/14 1730

## 2015-02-07 ENCOUNTER — Inpatient Hospital Stay
Admission: EM | Admit: 2015-02-07 | Discharge: 2015-02-14 | DRG: 100 | Disposition: A | Discharge status: Skilled Nursing Facility | Payer: Medicare Other | Attending: Internal Medicine | Admitting: Internal Medicine

## 2015-02-07 DIAGNOSIS — R627 Adult failure to thrive: Secondary | ICD-10-CM | POA: Diagnosis present

## 2015-02-07 DIAGNOSIS — F17210 Nicotine dependence, cigarettes, uncomplicated: Secondary | ICD-10-CM | POA: Diagnosis present

## 2015-02-07 DIAGNOSIS — Z23 Encounter for immunization: Secondary | ICD-10-CM

## 2015-02-07 DIAGNOSIS — Z91010 Allergy to peanuts: Secondary | ICD-10-CM

## 2015-02-07 DIAGNOSIS — R7989 Other specified abnormal findings of blood chemistry: Secondary | ICD-10-CM

## 2015-02-07 DIAGNOSIS — I517 Cardiomegaly: Secondary | ICD-10-CM | POA: Diagnosis present

## 2015-02-07 DIAGNOSIS — R778 Other specified abnormalities of plasma proteins: Secondary | ICD-10-CM | POA: Insufficient documentation

## 2015-02-07 DIAGNOSIS — R569 Unspecified convulsions: Secondary | ICD-10-CM | POA: Insufficient documentation

## 2015-02-07 DIAGNOSIS — R531 Weakness: Secondary | ICD-10-CM | POA: Diagnosis present

## 2015-02-07 DIAGNOSIS — E43 Unspecified severe protein-calorie malnutrition: Secondary | ICD-10-CM | POA: Diagnosis present

## 2015-02-07 DIAGNOSIS — G40901 Epilepsy, unspecified, not intractable, with status epilepticus: Secondary | ICD-10-CM | POA: Diagnosis present

## 2015-02-07 DIAGNOSIS — J449 Chronic obstructive pulmonary disease, unspecified: Secondary | ICD-10-CM | POA: Diagnosis present

## 2015-02-07 DIAGNOSIS — G40909 Epilepsy, unspecified, not intractable, without status epilepticus: Secondary | ICD-10-CM | POA: Diagnosis not present

## 2015-02-07 DIAGNOSIS — R4182 Altered mental status, unspecified: Secondary | ICD-10-CM | POA: Insufficient documentation

## 2015-02-07 DIAGNOSIS — J9602 Acute respiratory failure with hypercapnia: Secondary | ICD-10-CM | POA: Diagnosis present

## 2015-02-07 DIAGNOSIS — Z9114 Patient's other noncompliance with medication regimen: Secondary | ICD-10-CM

## 2015-02-07 DIAGNOSIS — R001 Bradycardia, unspecified: Secondary | ICD-10-CM | POA: Diagnosis present

## 2015-02-07 DIAGNOSIS — Z8542 Personal history of malignant neoplasm of other parts of uterus: Secondary | ICD-10-CM

## 2015-02-07 DIAGNOSIS — I248 Other forms of acute ischemic heart disease: Secondary | ICD-10-CM | POA: Diagnosis present

## 2015-02-07 DIAGNOSIS — F0280 Dementia in other diseases classified elsewhere without behavioral disturbance: Secondary | ICD-10-CM | POA: Diagnosis present

## 2015-02-07 DIAGNOSIS — G309 Alzheimer's disease, unspecified: Secondary | ICD-10-CM | POA: Diagnosis present

## 2015-02-07 DIAGNOSIS — G9341 Metabolic encephalopathy: Secondary | ICD-10-CM | POA: Diagnosis present

## 2015-02-07 DIAGNOSIS — F0150 Vascular dementia without behavioral disturbance: Secondary | ICD-10-CM | POA: Diagnosis present

## 2015-02-07 HISTORY — DX: Alzheimer's disease, unspecified: G30.9

## 2015-02-07 HISTORY — DX: Unspecified dementia, unspecified severity, without behavioral disturbance, psychotic disturbance, mood disturbance, and anxiety: F03.90

## 2015-02-07 HISTORY — DX: Dementia in other diseases classified elsewhere, unspecified severity, without behavioral disturbance, psychotic disturbance, mood disturbance, and anxiety: F02.80

## 2015-02-07 MED ORDER — LORAZEPAM 2 MG/ML IJ SOLN
INTRAMUSCULAR | Status: AC
  Administered 2015-02-07: 1 mg
  Filled 2015-02-07: qty 1

## 2015-02-07 MED ORDER — LORAZEPAM 2 MG/ML IJ SOLN
INTRAMUSCULAR | Status: AC
  Filled 2015-02-07: qty 1

## 2015-02-07 MED ORDER — ACETAMINOPHEN 650 MG RE SUPP
RECTAL | Status: AC
  Administered 2015-02-07: via RECTAL
  Filled 2015-02-07: qty 1

## 2015-02-07 MED ORDER — SODIUM CHLORIDE 0.9 % IV BOLUS (SEPSIS)
1000.0000 mL | Freq: Once | INTRAVENOUS | Status: AC
  Administered 2015-02-08: 1000 mL via INTRAVENOUS

## 2015-02-07 MED ORDER — LORAZEPAM 2 MG/ML IJ SOLN
2.0000 mg | Freq: Once | INTRAMUSCULAR | Status: AC
  Administered 2015-02-07: 2 mg via INTRAVENOUS

## 2015-02-07 NOTE — ED Notes (Signed)
Patient to [facility] 13 via EMS.  EMS reports that patient called her niece and said she needed help and niece called EMS.  Patient altered from her normal.  Patient with seizure like movements.  Dr Dahlia Client called to bedside.

## 2015-02-08 ENCOUNTER — Emergency Department: Payer: Medicare Other

## 2015-02-08 ENCOUNTER — Encounter: Payer: Self-pay | Admitting: Emergency Medicine

## 2015-02-08 DIAGNOSIS — J9601 Acute respiratory failure with hypoxia: Secondary | ICD-10-CM | POA: Diagnosis not present

## 2015-02-08 DIAGNOSIS — J449 Chronic obstructive pulmonary disease, unspecified: Secondary | ICD-10-CM | POA: Diagnosis not present

## 2015-02-08 DIAGNOSIS — J9602 Acute respiratory failure with hypercapnia: Secondary | ICD-10-CM | POA: Diagnosis not present

## 2015-02-08 DIAGNOSIS — G309 Alzheimer's disease, unspecified: Secondary | ICD-10-CM | POA: Diagnosis not present

## 2015-02-08 DIAGNOSIS — Z91010 Allergy to peanuts: Secondary | ICD-10-CM | POA: Diagnosis not present

## 2015-02-08 DIAGNOSIS — R627 Adult failure to thrive: Secondary | ICD-10-CM | POA: Diagnosis not present

## 2015-02-08 DIAGNOSIS — R569 Unspecified convulsions: Secondary | ICD-10-CM | POA: Diagnosis present

## 2015-02-08 DIAGNOSIS — R531 Weakness: Secondary | ICD-10-CM | POA: Diagnosis not present

## 2015-02-08 DIAGNOSIS — I248 Other forms of acute ischemic heart disease: Secondary | ICD-10-CM | POA: Diagnosis not present

## 2015-02-08 DIAGNOSIS — G40901 Epilepsy, unspecified, not intractable, with status epilepticus: Secondary | ICD-10-CM | POA: Diagnosis not present

## 2015-02-08 DIAGNOSIS — R400 Somnolence: Secondary | ICD-10-CM

## 2015-02-08 DIAGNOSIS — F0150 Vascular dementia without behavioral disturbance: Secondary | ICD-10-CM | POA: Diagnosis present

## 2015-02-08 DIAGNOSIS — G40909 Epilepsy, unspecified, not intractable, without status epilepticus: Secondary | ICD-10-CM | POA: Diagnosis present

## 2015-02-08 DIAGNOSIS — R7989 Other specified abnormal findings of blood chemistry: Secondary | ICD-10-CM | POA: Diagnosis not present

## 2015-02-08 DIAGNOSIS — Z23 Encounter for immunization: Secondary | ICD-10-CM | POA: Diagnosis not present

## 2015-02-08 DIAGNOSIS — R4182 Altered mental status, unspecified: Secondary | ICD-10-CM | POA: Diagnosis not present

## 2015-02-08 DIAGNOSIS — Z8542 Personal history of malignant neoplasm of other parts of uterus: Secondary | ICD-10-CM | POA: Diagnosis not present

## 2015-02-08 DIAGNOSIS — F17210 Nicotine dependence, cigarettes, uncomplicated: Secondary | ICD-10-CM | POA: Diagnosis not present

## 2015-02-08 DIAGNOSIS — Z720 Tobacco use: Secondary | ICD-10-CM | POA: Diagnosis not present

## 2015-02-08 DIAGNOSIS — E43 Unspecified severe protein-calorie malnutrition: Secondary | ICD-10-CM | POA: Diagnosis present

## 2015-02-08 DIAGNOSIS — I517 Cardiomegaly: Secondary | ICD-10-CM | POA: Diagnosis not present

## 2015-02-08 DIAGNOSIS — Z9114 Patient's other noncompliance with medication regimen: Secondary | ICD-10-CM | POA: Diagnosis not present

## 2015-02-08 DIAGNOSIS — R001 Bradycardia, unspecified: Secondary | ICD-10-CM | POA: Diagnosis not present

## 2015-02-08 DIAGNOSIS — G9341 Metabolic encephalopathy: Secondary | ICD-10-CM | POA: Diagnosis not present

## 2015-02-08 DIAGNOSIS — F0280 Dementia in other diseases classified elsewhere without behavioral disturbance: Secondary | ICD-10-CM | POA: Diagnosis present

## 2015-02-08 LAB — URINALYSIS COMPLETE WITH MICROSCOPIC (ARMC ONLY)
BACTERIA UA: NONE SEEN
BILIRUBIN URINE: NEGATIVE
GLUCOSE, UA: NEGATIVE mg/dL
KETONES UR: NEGATIVE mg/dL
LEUKOCYTES UA: NEGATIVE
Nitrite: NEGATIVE
Protein, ur: 30 mg/dL — AB
SPECIFIC GRAVITY, URINE: 1.011 (ref 1.005–1.030)
Squamous Epithelial / LPF: NONE SEEN
pH: 6 (ref 5.0–8.0)

## 2015-02-08 LAB — BLOOD GAS, ARTERIAL
ALLENS TEST (PASS/FAIL): POSITIVE — AB
Acid-Base Excess: 1.4 mmol/L (ref 0.0–3.0)
BICARBONATE: 29.3 meq/L — AB (ref 21.0–28.0)
FIO2: 0.28
O2 Saturation: 94.8 %
PATIENT TEMPERATURE: 37
pCO2 arterial: 61 mmHg — ABNORMAL HIGH (ref 32.0–48.0)
pH, Arterial: 7.29 — ABNORMAL LOW (ref 7.350–7.450)
pO2, Arterial: 83 mmHg (ref 83.0–108.0)

## 2015-02-08 LAB — CBC
HCT: 43 % (ref 35.0–47.0)
Hemoglobin: 14.1 g/dL (ref 12.0–16.0)
MCH: 33.4 pg (ref 26.0–34.0)
MCHC: 32.9 g/dL (ref 32.0–36.0)
MCV: 101.8 fL — ABNORMAL HIGH (ref 80.0–100.0)
PLATELETS: 212 10*3/uL (ref 150–440)
RBC: 4.22 MIL/uL (ref 3.80–5.20)
RDW: 14.7 % — AB (ref 11.5–14.5)
WBC: 9 10*3/uL (ref 3.6–11.0)

## 2015-02-08 LAB — COMPREHENSIVE METABOLIC PANEL
ALBUMIN: 3 g/dL — AB (ref 3.5–5.0)
ALK PHOS: 40 U/L (ref 38–126)
ALT: 12 U/L — ABNORMAL LOW (ref 14–54)
ALT: 14 U/L (ref 14–54)
AST: 25 U/L (ref 15–41)
AST: 26 U/L (ref 15–41)
Albumin: 3.7 g/dL (ref 3.5–5.0)
Alkaline Phosphatase: 51 U/L (ref 38–126)
Anion gap: 5 (ref 5–15)
Anion gap: 6 (ref 5–15)
BILIRUBIN TOTAL: 0.3 mg/dL (ref 0.3–1.2)
BUN: 11 mg/dL (ref 6–20)
BUN: 16 mg/dL (ref 6–20)
CALCIUM: 8.2 mg/dL — AB (ref 8.9–10.3)
CHLORIDE: 110 mmol/L (ref 101–111)
CO2: 26 mmol/L (ref 22–32)
CO2: 30 mmol/L (ref 22–32)
CREATININE: 0.47 mg/dL (ref 0.44–1.00)
Calcium: 8.8 mg/dL — ABNORMAL LOW (ref 8.9–10.3)
Chloride: 117 mmol/L — ABNORMAL HIGH (ref 101–111)
Creatinine, Ser: 0.36 mg/dL — ABNORMAL LOW (ref 0.44–1.00)
GFR calc Af Amer: >60 mL/min (ref >60)
GFR calc Af Amer: >60 mL/min (ref >60)
GFR calc non Af Amer: >60 mL/min (ref >60)
GLUCOSE: 85 mg/dL (ref 65–99)
Glucose, Bld: 97 mg/dL (ref 65–99)
Potassium: 3.2 mmol/L — ABNORMAL LOW (ref 3.5–5.1)
Potassium: 3.8 mmol/L (ref 3.5–5.1)
SODIUM: 146 mmol/L — AB (ref 135–145)
Sodium: 148 mmol/L — ABNORMAL HIGH (ref 135–145)
TOTAL PROTEIN: 5.5 g/dL — AB (ref 6.5–8.1)
Total Bilirubin: 0.5 mg/dL (ref 0.3–1.2)
Total Protein: 6.5 g/dL (ref 6.5–8.1)

## 2015-02-08 LAB — TROPONIN I
Troponin I: 0.14 ng/mL — ABNORMAL HIGH (ref <0.031)
Troponin I: 0.36 ng/mL — ABNORMAL HIGH (ref <0.031)
Troponin I: 0.47 ng/mL — ABNORMAL HIGH (ref <0.031)
Troponin I: 0.83 ng/mL — ABNORMAL HIGH (ref <0.031)

## 2015-02-08 LAB — HEMOGLOBIN A1C: Hgb A1c MFr Bld: 5.4 % (ref 4.0–6.0)

## 2015-02-08 LAB — LACTIC ACID, PLASMA
Lactic Acid, Venous: 0.8 mmol/L (ref 0.5–2.0)
Lactic Acid, Venous: 1.1 mmol/L (ref 0.5–2.0)

## 2015-02-08 LAB — MRSA PCR SCREENING: MRSA by PCR: POSITIVE — AB

## 2015-02-08 LAB — CK: Total CK: 156 U/L (ref 38–234)

## 2015-02-08 LAB — TSH: TSH: 2.825 u[IU]/mL (ref 0.350–4.500)

## 2015-02-08 MED ORDER — MUPIROCIN 2 % EX OINT
1.0000 "application " | TOPICAL_OINTMENT | Freq: Two times a day (BID) | CUTANEOUS | Status: AC
  Administered 2015-02-08 – 2015-02-12 (×10): 1 via NASAL
  Filled 2015-02-08 (×2): qty 22

## 2015-02-08 MED ORDER — HEPARIN SODIUM (PORCINE) 5000 UNIT/ML IJ SOLN
5000.0000 [IU] | Freq: Three times a day (TID) | INTRAMUSCULAR | Status: DC
  Administered 2015-02-08 – 2015-02-09 (×3): 5000 [IU] via SUBCUTANEOUS
  Filled 2015-02-08 (×3): qty 1

## 2015-02-08 MED ORDER — DEXTROSE-NACL 5-0.9 % IV SOLN
INTRAVENOUS | Status: DC
  Administered 2015-02-08: 06:00:00 via INTRAVENOUS

## 2015-02-08 MED ORDER — ONDANSETRON HCL 4 MG/2ML IJ SOLN: 4.0000 mg | Freq: Four times a day (QID) | INTRAMUSCULAR | Status: DC | PRN

## 2015-02-08 MED ORDER — ONDANSETRON HCL 4 MG PO TABS: 4.0000 mg | ORAL_TABLET | Freq: Four times a day (QID) | ORAL | Status: DC | PRN

## 2015-02-08 MED ORDER — CHLORHEXIDINE GLUCONATE 0.12 % MT SOLN
15.0000 mL | Freq: Two times a day (BID) | OROMUCOSAL | Status: DC
  Administered 2015-02-08 (×2): 15 mL via OROMUCOSAL
  Filled 2015-02-08 (×2): qty 15

## 2015-02-08 MED ORDER — ACETAMINOPHEN 325 MG PO TABS
650.0000 mg | ORAL_TABLET | Freq: Four times a day (QID) | ORAL | Status: DC | PRN
  Administered 2015-02-10 – 2015-02-12 (×3): 650 mg via ORAL
  Filled 2015-02-08 (×3): qty 2

## 2015-02-08 MED ORDER — SODIUM CHLORIDE 0.9 % IJ SOLN
3.0000 mL | Freq: Two times a day (BID) | INTRAMUSCULAR | Status: DC
Start: 2015-02-08 — End: 2015-02-11
  Administered 2015-02-08 – 2015-02-10 (×6): 3 mL via INTRAVENOUS

## 2015-02-08 MED ORDER — CHLORHEXIDINE GLUCONATE CLOTH 2 % EX PADS
6.0000 | MEDICATED_PAD | Freq: Every day | CUTANEOUS | Status: AC
  Administered 2015-02-09 – 2015-02-12 (×4): 6 via TOPICAL

## 2015-02-08 MED ORDER — SODIUM CHLORIDE 0.9 % IV SOLN
1000.0000 mg | Freq: Once | INTRAVENOUS | Status: AC
  Administered 2015-02-08: 1000 mg via INTRAVENOUS
  Filled 2015-02-08: qty 10

## 2015-02-08 MED ORDER — ACETAMINOPHEN 650 MG RE SUPP: 650.0000 mg | Freq: Four times a day (QID) | RECTAL | Status: DC | PRN

## 2015-02-08 MED ORDER — SODIUM CHLORIDE 0.9 % IV SOLN
500.0000 mg | Freq: Two times a day (BID) | INTRAVENOUS | Status: AC
  Administered 2015-02-08 – 2015-02-10 (×4): 500 mg via INTRAVENOUS
  Filled 2015-02-08 (×6): qty 5

## 2015-02-08 MED ORDER — SODIUM CHLORIDE 0.9 % IV SOLN
INTRAVENOUS | Status: DC
  Administered 2015-02-08: 05:00:00 via INTRAVENOUS

## 2015-02-08 NOTE — Progress Notes (Signed)
Pt remains lethargic however she is able to follow simple commands and open eyes to voice; remains on 30% bipap per Dr. Merian Capron verbal orders due to ABG results this am per Dr. Stevenson Clinch will not repeat ABG today; vss; adequate uop via foley; no s/s of pain present; sinus brady on cardiac monitor hr 40's; plans for MRI of brain attempted to contact MRI department several times during shift unable to get a response Dr. Margaretmary Eddy stated it was okay for MRI to be performed in the am;  EEG to be performed in am; pts family updated about plan of care and questions answered will continue to monitor and assess pt

## 2015-02-08 NOTE — ED Provider Notes (Signed)
[facility] Emergency Department Provider Note  ____________________________________________  Time seen: Approximately 2340 PM  I have reviewed the triage vital signs and the nursing notes.   HISTORY  Chief Complaint Altered Mental Status  Patient with altered mental status and unable to answer questions at this time.  HPI Traci Holmes is a 69 y.o. female who was brought in by EMS after being found on the floor. The patient lives at home alone and called a niece who lives nearby. She called on the phone and said that she needed someone to come help her. The niece reports that when she got to the house the patient was on the floor with some boxes around her. She reports initially she could not get in so she called 911. When EMS arrived they report that the patient was twitching on her left side. The patient reports that she shakes sometimes but the niece has never seen anything like this before. EMS reports that they gave the patient 1 mg of intranasal Versed with a concern for seizure and decided to bring her into the hospital. The patient does have a history of dementia and does sometimes say nonsense statements but otherwise has no other known history.   Past Medical History  Diagnosis Date  . Major neurocognitive disorder, unspecified 08/25/2014  . Dementia   . Alzheimer disease     Patient Active Problem List   Diagnosis Date Noted  . Status epilepticus ([place]) 02/08/2015  . Dementia, old age   . Major neurocognitive disorder, unspecified 08/25/2014    No past surgical history on file.  No current outpatient prescriptions on file.  Allergies Penicillins  No family history on file.  Social History Social History  Substance Use Topics  . Smoking status: Current Every Day Smoker -- 1.00 packs/day    Types: Cigarettes  . Smokeless tobacco: Not on file  . Alcohol Use: No    Review of Systems  Unable to assess due to patient altered mental  status.  ____________________________________________   PHYSICAL EXAM:  VITAL SIGNS: ED Triage Vitals  Enc Vitals Group     BP 02/08/15 0006 110/66 mmHg     Pulse Rate 02/07/15 2349 90     Resp 02/07/15 2349 17     Temp 02/07/15 2349 99 F (37.2 C)     Temp Source 02/07/15 2349 Oral     SpO2 02/07/15 2349 96 %     Weight 02/07/15 2349 81 lb 5.6 oz (36.9 kg)     Height 02/07/15 2349 '5\' 2"'$  (1.575 m)     Head Cir --      Peak Flow --      Pain Score --      Pain Loc --      Pain Edu? --      Excl. in [place]? --     Constitutional: The patient is awake and staring with left-sided jerking movements Eyes: Conjunctivae are normal. PERRL. EOMI. Head: Atraumatic. Nose: No congestion/rhinnorhea. Mouth/Throat: Mucous membranes are moist.   Cardiovascular: Normal rate, regular rhythm. Grossly normal heart sounds.  Good peripheral circulation. Respiratory: Increased respiratory effort with coarse expiratory breath sounds Gastrointestinal: Soft and nontender. No distention. Positive bowel sounds Musculoskeletal: No lower extremity tenderness nor edema.   Neurologic:  Left-sided jerking like motions as well as twitching of face and left side of mouth. Patient groans as if in response when you talk to her but does not say any words. Skin:  Skin is warm,  dry and intact. No rash noted. Psychiatric: Not interacti e  ____________________________________________   LABS (all labs ordered are listed, but only abnormal results are displayed)  Labs Reviewed  CBC - Abnormal; Notable for the following:    MCV 101.8 (*)    RDW 14.7 (*)    All other components within normal limits  COMPREHENSIVE METABOLIC PANEL - Abnormal; Notable for the following:    Sodium 146 (*)    Calcium 8.8 (*)    All other components within normal limits  TROPONIN I - Abnormal; Notable for the following:    Troponin I 0.14 (*)    All other components within normal limits  URINALYSIS COMPLETEWITH MICROSCOPIC ([facility]  ONLY) - Abnormal; Notable for the following:    Color, Urine YELLOW (*)    APPearance CLEAR (*)    Hgb urine dipstick 1+ (*)    Protein, ur 30 (*)    All other components within normal limits  CULTURE, BLOOD (ROUTINE X 2)  CULTURE, BLOOD (ROUTINE X 2)  URINE CULTURE  LACTIC ACID, PLASMA  CK  LACTIC ACID, PLASMA   ____________________________________________  EKG  ED ECG REPORT I, Loney Hering, the attending physician, personally viewed and interpreted this ECG.   Date: 02/08/2015  EKG Time: 0003  Rate: 62  Rhythm: normal sinus rhythm  Axis: Normal  Intervals:none  ST&T Change: None  ____________________________________________  RADIOLOGY  CT head: No acute intracranial abnormalities, chronic atrophy and small vessel ischemic changes Chest x-ray: Cardiac enlargement with mild vascular congestion, emphysematous changes and chronic bronchitic changes in the lungs, no evidence of active pulmonary disease. ____________________________________________   PROCEDURES  Procedure(s) performed: None  Critical Care performed: No  ____________________________________________   INITIAL IMPRESSION / ASSESSMENT AND PLAN / ED COURSE  Pertinent labs & imaging results that were available during my care of the patient were reviewed by me and considered in my medical decision making (see chart for details).  This is a 69 year old female who comes into the hospital today with altered mental status. The patient was having some jerking movements which appears to be a seizure. The concern may be that the patient could have bleeding to her brain but the CT scan did not show any bleeding to her brain. I gave the patient initially 2 mg of Ativan IV and then a second 2 mg of Ativan IV. The seizures did seem to subside and the patient did fall asleep. We contacted the patient's son who would be her next of kin and he did not make a statement about the patient having a DO NOT RESUSCITATE so  we will treat the patient is a full code at this time. The patient will receive a liter of normal saline as well as a dose of Keppra for the seizures. We will continue to evaluate for infection and reassess the patient once received received all her results.  ----------------------------------------- 3:44 AM on 02/08/2015 -----------------------------------------  The patient does not have elevated white blood cell count nor does she have elevated creatinine or other signs of infection. The patient's chest x-ray does not show pneumonia nor does she have any UTI.  The patient does have elevated troponin but is unable to health that she's having chest pain. Given the patient's presentation I will admit the patient to the hospitalist service for further evaluation of her heart and her seizure. The patient at this time is in no acute distress and is sleepy. I will admit the patient to the hospitalist service.  ____________________________________________   FINAL CLINICAL IMPRESSION(S) / ED DIAGNOSES  Final diagnoses:  Seizure ([place])  Elevated troponin  Altered mental status, unspecified altered mental status type      Loney Hering, MD 02/08/15 [phone]

## 2015-02-08 NOTE — Progress Notes (Signed)
Pt arrived from ED.  Pt only responsive to painful stimuli.  Will not follow commands.  She appears to be protecting her airway at this time.  Resp are even but shallow.  Mild hypotension, and bradycardia noted.  Unchanged since ED.  D5NS started at 82m/hr.

## 2015-02-08 NOTE — Consult Note (Signed)
PULMONARY / CRITICAL CARE MEDICINE   Name: Traci Holmes MRN: [id number] DOB: [birth year]    ADMISSION DATE:  02/07/2015 CONSULTATION DATE:  02/08/15  REFERRING MD :  Dr. Margaretmary Eddy   CHIEF COMPLAINT:  Dementia, ? Seizures  REASON FOR CONSULT - Somnolence and hypercapnia   HISTORY OF PRESENT ILLNESS  History per chart review  The patient presents to the emergency department via EMS after being found in her home unresponsive and in suspected seizure-like activity. Per report the patient's niece reports that the patient called to complain about her legs feeling numb and tingly. The niece arrived at the patient's home approximately 5 minutes later and was unable to gain entry. As she knocked on the door she could hear banging inside of the house as if something were repeatedly hitting a wall or the floor. First responders had to go through a window to enter the home and found the patient on the floor "shaking". She was given Ativan 4 mg IV and taken to the emergency department at which time she was loaded with Keppra. She has been somnolent and difficult to arouse without sternal rub since that time. She has had a few desaturations but will cough and clear her secretions. She was noted to have some twitching of her arms and feet that lasts only a few seconds despite Ativan on board (prior to [place] load) by the admitting physician. Patient got a total of 4 mg of IV Ativan and Keppra load, this morning noted to have hypercapnia on her ABG, and still with somnolence. PCCM was consulted. Upon my evaluation patient noted to be somnolent, but easily awakened without sternal rub, her level of alertness is depressed, she will not follow simple commands, but does vocalize some words.    SIGNIFICANT EVENTS  12/18>? Sz activity at home, keppra load, '4mg'$  IV ativan, started on Bipap   PAST MEDICAL HISTORY    :  Past Medical History  Diagnosis Date  . Major neurocognitive disorder, unspecified 08/25/2014   . Dementia   . Alzheimer disease    No past surgical history on file. Prior to Admission medications   Not on File   Allergies  Allergen Reactions  . Penicillins Other (See Comments)    Reaction: unknown  Patient cannot answer follow-up questions     FAMILY HISTORY   No family history on file.    SOCIAL HISTORY    reports that she has been smoking Cigarettes.  She has been smoking about 1.00 pack per day. She does not have any smokeless tobacco history on file. She reports that she does not drink alcohol or use illicit drugs.  Review of Systems  Unable to perform ROS: mental acuity      VITAL SIGNS    Temp:  [97.5 F (36.4 C)-99.1 F (37.3 C)] 97.5 F (36.4 C) (12/18 0700) Pulse Rate:  [45-90] 45 (12/18 1022) Resp:  [14-27] 14 (12/18 1022) BP: (91-134)/(38-76) 134/69 mmHg (12/18 1022) SpO2:  [95 %-100 %] 100 % (12/18 1000) Weight:  [81 lb 5.6 oz (36.9 kg)] 81 lb 5.6 oz (36.9 kg) (12/17 2349) HEMODYNAMICS:   VENTILATOR SETTINGS:   INTAKE / OUTPUT:  Intake/Output Summary (Last 24 hours) at 02/08/15 1057 Last data filed at 02/08/15 0600  Gross per 24 hour  Intake   1000 ml  Output    775 ml  Net    225 ml       PHYSICAL EXAM   Physical Exam  Constitutional: She appears well-developed  and well-nourished.  HENT:  Head: Normocephalic and atraumatic.  Right Ear: External ear normal.  Left Ear: External ear normal.  Nose: Nose normal.  Mouth/Throat: Oropharynx is clear and moist.  Eyes: Conjunctivae and EOM are normal. Pupils are equal, round, and reactive to light.  Neck: Normal range of motion. Neck supple.  Cardiovascular: Normal rate, regular rhythm and intact distal pulses.   Murmur heard. Pulmonary/Chest: Effort normal and breath sounds normal. No respiratory distress. She has no wheezes.  Abdominal: Soft. Bowel sounds are normal. She exhibits no distension. There is no tenderness. There is no rebound.  Musculoskeletal: Normal range of  motion. She exhibits no edema or tenderness.  Neurological:  Somnolent, but easily awaken with confusion. Could not perform a mini mental exam due somnolence and confusion No focal neurological deficits noted.   Skin: Skin is warm and dry. No rash noted. No erythema.  Nursing note and vitals reviewed.      LABS   LABS:  CBC  Recent Labs Lab 02/07/15 2356  WBC 9.0  HGB 14.1  HCT 43.0  PLT 212   Coag's No results for input(s): APTT, INR in the last 168 hours. BMET  Recent Labs Lab 02/07/15 2356  NA 146*  K 3.8  CL 110  CO2 30  BUN 16  CREATININE 0.47  GLUCOSE 97   Electrolytes  Recent Labs Lab 02/07/15 2356  CALCIUM 8.8*   Sepsis Markers  Recent Labs Lab 02/07/15 2356 02/08/15 0309  LATICACIDVEN 0.8 1.1   ABG  Recent Labs Lab 02/08/15 0930  PHART 7.29*  PCO2ART 61*  PO2ART 83   Liver Enzymes  Recent Labs Lab 02/07/15 2356  AST 26  ALT 14  ALKPHOS 51  BILITOT 0.5  ALBUMIN 3.7   Cardiac Enzymes  Recent Labs Lab 02/07/15 2356 02/08/15 0437  TROPONINI 0.14* 0.83*   Glucose No results for input(s): GLUCAP in the last 168 hours.   Recent Results (from the past 240 hour(s))  Blood culture (routine x 2)     Status: None (Preliminary result)   Collection Time: 02/07/15 11:56 PM  Result Value Ref Range Status   Specimen Description BLOOD RIGHT ARM  Final   Special Requests BOTTLES DRAWN AEROBIC AND ANAEROBIC 4CC  Final   Culture NO GROWTH < 12 HOURS  Final   Report Status PENDING  Incomplete  Blood culture (routine x 2)     Status: None (Preliminary result)   Collection Time: 02/07/15 11:58 PM  Result Value Ref Range Status   Specimen Description BLOOD RIGHT ARM  Final   Special Requests BOTTLES DRAWN AEROBIC AND ANAEROBIC 4CC  Final   Culture NO GROWTH < 12 HOURS  Final   Report Status PENDING  Incomplete  MRSA PCR Screening     Status: Abnormal   Collection Time: 02/08/15  4:32 AM  Result Value Ref Range Status   MRSA by  PCR POSITIVE (A) NEGATIVE Final    Comment:        The GeneXpert MRSA Assay (FDA approved for NASAL specimens only), is one component of a comprehensive MRSA colonization surveillance program. It is not intended to diagnose MRSA infection nor to guide or monitor treatment for MRSA infections. CRITICAL RESULT CALLED TO, READ BACK BY AND VERIFIED WITH: ADAM SCARBOROUGH ON 02/08/15 AT 0630 BY QSD      Current facility-administered medications:  .  acetaminophen (TYLENOL) tablet 650 mg, 650 mg, Oral, Q6H PRN **OR** acetaminophen (TYLENOL) suppository 650 mg, 650 mg, Rectal, Q6H  PRN, Harrie Foreman, MD .  chlorhexidine (PERIDEX) 0.12 % solution 15 mL, 15 mL, Mouth/Throat, BID, Nicholes Mango, MD, 15 mL at 02/08/15 0958 .  Chlorhexidine Gluconate Cloth 2 % PADS 6 each, 6 each, Topical, Q0600, Harrie Foreman, MD .  dextrose 5 %-0.9 % sodium chloride infusion, , Intravenous, Continuous, Harrie Foreman, MD, Last Rate: 75 mL/hr at 02/08/15 1000 .  heparin injection 5,000 Units, 5,000 Units, Subcutaneous, 3 times per day, Harrie Foreman, MD .  LORazepam (ATIVAN) 2 MG/ML injection, , , ,  .  mupirocin ointment (BACTROBAN) 2 % 1 application, 1 application, Nasal, BID, Harrie Foreman, MD, 1 application at 53/64/68 [phone] .  ondansetron (ZOFRAN) tablet 4 mg, 4 mg, Oral, Q6H PRN **OR** ondansetron (ZOFRAN) injection 4 mg, 4 mg, Intravenous, Q6H PRN, Harrie Foreman, MD .  sodium chloride 0.9 % injection 3 mL, 3 mL, Intravenous, Q12H, Harrie Foreman, MD, 3 mL at 02/08/15 0958  IMAGING    Ct Head Wo Contrast  02/08/2015  CLINICAL DATA:  Altered mental status and seizure. History of major neuro cognitive disorder. EXAM: CT HEAD WITHOUT CONTRAST TECHNIQUE: Contiguous axial images were obtained from the base of the skull through the vertex without intravenous contrast. COMPARISON:  08/25/2014 FINDINGS: Diffuse cerebral atrophy. Mild ventricular dilatation consistent with central atrophy.  Patchy low-attenuation changes throughout the deep white matter most likely to represent small vessel ischemic change. No mass effect or midline shift. No abnormal extra-axial fluid collections. Gray-white matter junctions are distinct. Basal cisterns are not effaced. No evidence of acute intracranial hemorrhage. No depressed skull fractures. Visualized paranasal sinuses and mastoid air cells are not opacified. Vascular calcifications. IMPRESSION: No acute intracranial abnormalities. Chronic atrophy and small vessel ischemic changes. Electronically Signed   By: Lucienne Capers M.D.   On: 02/08/2015 00:30   Dg Chest Portable 1 View  02/08/2015  CLINICAL DATA:  Altered mental status and seizures. History of dementia, Alzheimer's, and seizures. EXAM: PORTABLE CHEST 1 VIEW COMPARISON:  08/25/2014 FINDINGS: Cardiac enlargement with mild vascular congestion. No edema or consolidation. Diffuse emphysematous changes in the lungs with central interstitial process likely representing chronic bronchitis. No focal airspace disease or consolidation. No blunting of costophrenic angles. No pneumothorax. Calcified and tortuous aorta. Degenerative changes in the spine. IMPRESSION: Cardiac enlargement with mild vascular congestion. Emphysematous changes and chronic bronchitic changes in the lungs. No evidence of active pulmonary disease. Electronically Signed   By: Lucienne Capers M.D.   On: 02/08/2015 00:34      Indwelling Urinary Catheter continued, requirement due to   Reason to continue Indwelling Urinary Catheter for strict Intake/Output monitoring for hemodynamic instability   Central Line continued, requirement due to   Reason to continue [employer] Monitoring of central venous pressure or other hemodynamic parameters   Ventilator continued, requirement due to, resp failure    Ventilator Sedation RASS 0 to -2   Cultures: BCx2 12/18> UC 12/18> Sputum 12/18> MRSA -  POSTIVE  Antibiotics:   Lines:   ASSESSMENT/PLAN  69 year old past medical history of dementia, Alzheimer's, admitted for questionable seizure-like activity at home, got Keppra load and started on as needed Ativan, critical care consult if hypercapnia and somnolence  PULMONARY Hypercapnia Mild decreased respiratory status  P:   -Level of alertness has improved slightly since admission, will start patient on BiPAP -Hypercapnia is more likely secondary to a combination of factors including postictal state, decreased respiratory drive from benzodiazepines, questionable seizure -ABG as needed -Maintain O2  saturations greater than 88% -Monitor neuro status  CARDIOVASCULAR Elevated Troponin -Review of EKG shows no ST changes, continue to trend cardiac enzymes -Expect rise in CK levels, especially if patient had true seizure-like activity -Continue to monitor vitals and hemodynamics  RENAL -No acute issues -ICU electrolyte replacement protocol  GASTROINTESTINAL -SUP  HEMATOLOGIC -monitor CBC  INFECTIOUS -no active infections -f/u cultures  ENDOCRINE -SSI as needed  NEUROLOGIC Seizure Activity - ? Tonic clonic Post Ictal Somnolence Alz Dementia P:   RASS goal: 0 - loaded with keppra - ativan stopped currently - assessing baseline neuro statu - EEG - Neuro consult - start maintenance keppra - may need IV dosing until more awake - need to assess level of Alz Dem, patient living alone by report prior to most recent event.    I have personally obtained a history, examined the patient, evaluated laboratory and imaging results, formulated the assessment and plan and placed orders.  The Patient requires high complexity decision making for assessment and support, frequent evaluation and titration of therapies, application of advanced monitoring technologies and extensive interpretation of multiple databases. Critical Care Time devoted to patient care services described  in this note is 45 minutes.   Overall, patient is critically ill, prognosis is guarded. Patient at high risk for cardiac arrest and death.   Vilinda Boehringer, MD [facility] Pager [phone] (please enter 7-digits) On Call Pager [phone] (please enter 7-digits)     02/08/2015, 10:57 AM  Note: This note was prepared with Dragon dictation along with smaller phrase technology. Any transcriptional errors that result from this process are unintentional.

## 2015-02-08 NOTE — Progress Notes (Signed)
[place] [place] NAME: Traci Holmes    MR#:  [id number]  DATE OF BIRTH:  [birth year]  SUBJECTIVE:  CHIEF COMPLAINT:  Patient is still lethargic. Arousable to verbal commands but not talking at this time. On BiPAP. No family members at bedside  REVIEW OF SYSTEMS:  Unobtainable at this time from altered mental status  DRUG ALLERGIES:   Allergies  Allergen Reactions  . Penicillins Other (See Comments)    Reaction: unknown  Patient cannot answer follow-up questions    VITALS:  Blood pressure 90/51, pulse 50, temperature 97.1 F (36.2 C), temperature source Axillary, resp. rate 14, height '5\' 2"'$  (1.575 m), weight 36.9 kg (81 lb 5.6 oz), SpO2 98 %.  PHYSICAL EXAMINATION:  GENERAL:  69 y.o.-year-old patient lying in the bed with no acute distress.  EYES: Pupils equal, round, reactive to light and accommodation. No scleral icterus.  HEENT: Head atraumatic, normocephalic. Oropharynx and nasopharynx clear. Some bruises are noticed on the face NECK:  Supple, no jugular venous distention. No thyroid enlargement, no tenderness.  LUNGS: Diminished breath sounds bilaterally, no wheezing, rales,rhonchi or crepitation. No use of accessory muscles of respiration. On BiPAP CARDIOVASCULAR: S1, S2 normal. No murmurs, rubs, or gallops.  ABDOMEN: Soft, nontender, nondistended. Bowel sounds present. No organomegaly or mass.  EXTREMITIES: No pedal edema, cyanosis, or clubbing.  NEUROLOGIC: Altered mental status. Arousable to verbal commands Gait not checked.  PSYCHIATRIC: The patient is lethargic SKIN: No obvious rash, lesion, or ulcer.    LABORATORY PANEL:   CBC  Recent Labs Lab 02/07/15 2356  WBC 9.0  HGB 14.1  HCT 43.0  PLT 212   ------------------------------------------------------------------------------------------------------------------  Chemistries   Recent Labs Lab 02/07/15 2356  NA 146*  K 3.8  CL 110  CO2 30  GLUCOSE  97  BUN 16  CREATININE 0.47  CALCIUM 8.8*  AST 26  ALT 14  ALKPHOS 51  BILITOT 0.5   ------------------------------------------------------------------------------------------------------------------  Cardiac Enzymes  Recent Labs Lab 02/08/15 1029  TROPONINI 0.47*   ------------------------------------------------------------------------------------------------------------------  RADIOLOGY:  Ct Head Wo Contrast  02/08/2015  CLINICAL DATA:  Altered mental status and seizure. History of major neuro cognitive disorder. EXAM: CT HEAD WITHOUT CONTRAST TECHNIQUE: Contiguous axial images were obtained from the base of the skull through the vertex without intravenous contrast. COMPARISON:  08/25/2014 FINDINGS: Diffuse cerebral atrophy. Mild ventricular dilatation consistent with central atrophy. Patchy low-attenuation changes throughout the deep white matter most likely to represent small vessel ischemic change. No mass effect or midline shift. No abnormal extra-axial fluid collections. Gray-white matter junctions are distinct. Basal cisterns are not effaced. No evidence of acute intracranial hemorrhage. No depressed skull fractures. Visualized paranasal sinuses and mastoid air cells are not opacified. Vascular calcifications. IMPRESSION: No acute intracranial abnormalities. Chronic atrophy and small vessel ischemic changes. Electronically Signed   By: Lucienne Capers M.D.   On: 02/08/2015 00:30   Dg Chest Portable 1 View  02/08/2015  CLINICAL DATA:  Altered mental status and seizures. History of dementia, Alzheimer's, and seizures. EXAM: PORTABLE CHEST 1 VIEW COMPARISON:  08/25/2014 FINDINGS: Cardiac enlargement with mild vascular congestion. No edema or consolidation. Diffuse emphysematous changes in the lungs with central interstitial process likely representing chronic bronchitis. No focal airspace disease or consolidation. No blunting of costophrenic angles. No pneumothorax. Calcified and  tortuous aorta. Degenerative changes in the spine. IMPRESSION: Cardiac enlargement with mild vascular congestion. Emphysematous changes and chronic bronchitic changes in the lungs. No evidence of active  pulmonary disease. Electronically Signed   By: Lucienne Capers M.D.   On: 02/08/2015 00:34    EKG:   Orders placed or performed during the hospital encounter of 02/07/15  . ED EKG  . ED EKG  . EKG 12-Lead  . EKG 12-Lead    ASSESSMENT AND PLAN:  This is a 69 year old female admitted for seizure-like activity and elevated troponin. #. Altered mental status probably post ictal with acute respiratory failure with hypercapnia on her baseline Alzheimer's dementia No head bleed on CT scan.  Will obtain MRI of the brain with and without contrast and EEG in a.m.   #Acute respiratory failure with hypercapnia which could lower the seizure threshold  The patient is on BiPAP Continue close monitoring. Appreciate pulmonology recommendations  #New-onset seizures Continue Keppra MRI of the brain with and without contrast and EEG are ordered Appreciate neurology's recommendations  #. Elevated troponin: No EKG evidence of myocardial infarction. Increase in troponin may be secondary to prolonged seizure activity. Troponins are trending down  #. Malnourishment: With failure to thrive: The patient's BMI is 14.9; currently patient is lethargic will provide IV fluids with D5 half-normal .Once patient regains consciousness and is able to eat by mouth we'll obtain nutrition consultation for appropriate dietary needs. The patient's niece who is her primary caregiver reports uneaten food in the refrigerator as well as multifocal lying around the house is in disarray  #. Dementia: Probably from Alzheimer's disease.   #. DVT prophylaxis: Heparin #. GI prophylaxis: ppi     All the records are reviewed and case discussed with Care Management/Social Workerr. Management plans discussed with the patient,  family and they are in agreement.  CODE STATUS: Full code  TOTAL critical care  TIME TAKING CARE OF THIS PATIENT: 35 minutes.   POSSIBLE D/C IN 3-4 DAYS, DEPENDING ON CLINICAL CONDITION.   Nicholes Mango M.D on 02/08/2015 at 3:30 PM  Between 7am to 6pm - Pager - [phone] After 6pm go to [email or web address] - password [email or web address] [facility]  Office  [phone]  CC: Primary care physician; RICE, Alinda Deem, PA-C

## 2015-02-08 NOTE — ED Notes (Signed)
Report called to CCU, given to [place].

## 2015-02-08 NOTE — H&P (Addendum)
Traci Holmes is an 69 y.o. female.   Chief Complaint: Altered mental status HPI: The patient presents to the emergency department via EMS after being found in her home unresponsive in what appeared to be seizure-like activity. The patient's niece reports that the patient called to complain about her legs feeling numb and tingly. The niece arrived at the patient's home approximately 5 minutes later and was unable to gain entry. As she knocked on the door she could hear banging inside of the house as if something were repeatedly hitting a wall or the floor. First responders had to go through a window to enter the home and found the patient on the floor "shaking". She was given Ativan 4 mg IV and taken to the emergency department at which time she was loaded with Keppra. She has been somnolent and difficult to arouse without sternal rub since that time. She has had a few desaturations but will cough and clear her secretions. She was noted to have some twitching of her arms and feet that lasts only a few seconds despite Ativan on board (prior to Keppra load). Due to altered mental status and potential status epilepticus emergency department staff called for admission.  Past Medical History  Diagnosis Date  . Major neurocognitive disorder, unspecified 08/25/2014  . Dementia   . Alzheimer disease     No past surgical history on file. patient unable to contribute to her own history  No family history on file. not available Social History:  reports that she has been smoking Cigarettes.  She has been smoking about 1.00 pack per day. She does not have any smokeless tobacco history on file. She reports that she does not drink alcohol or use illicit drugs.  Allergies:  Allergies  Allergen Reactions  . Penicillins Other (See Comments)    Reaction: unknown  Patient cannot answer follow-up questions    No prescriptions prior to admission    Results for orders placed or performed during the hospital  encounter of 02/07/15 (from the past 48 hour(s))  CK     Status: None   Collection Time: 02/07/15  3:30 AM  Result Value Ref Range   Total CK 156 38 - 234 U/L  CBC     Status: Abnormal   Collection Time: 02/07/15 11:56 PM  Result Value Ref Range   WBC 9.0 3.6 - 11.0 K/uL   RBC 4.22 3.80 - 5.20 MIL/uL   Hemoglobin 14.1 12.0 - 16.0 g/dL   HCT 99.0 83.2 - 47.8 %   MCV 101.8 (H) 80.0 - 100.0 fL   MCH 33.4 26.0 - 34.0 pg   MCHC 32.9 32.0 - 36.0 g/dL   RDW 03.7 (H) 50.4 - 23.7 %   Platelets 212 150 - 440 K/uL  Comprehensive metabolic panel     Status: Abnormal   Collection Time: 02/07/15 11:56 PM  Result Value Ref Range   Sodium 146 (H) 135 - 145 mmol/L   Potassium 3.8 3.5 - 5.1 mmol/L   Chloride 110 101 - 111 mmol/L   CO2 30 22 - 32 mmol/L   Glucose, Bld 97 65 - 99 mg/dL   BUN 16 6 - 20 mg/dL   Creatinine, Ser 8.27 0.44 - 1.00 mg/dL   Calcium 8.8 (L) 8.9 - 10.3 mg/dL   Total Protein 6.5 6.5 - 8.1 g/dL   Albumin 3.7 3.5 - 5.0 g/dL   AST 26 15 - 41 U/L   ALT 14 14 - 54 U/L   Alkaline  Phosphatase 51 38 - 126 U/L   Total Bilirubin 0.5 0.3 - 1.2 mg/dL   GFR calc non Af Amer >60 >60 mL/min   GFR calc Af Amer >60 >60 mL/min    Comment: (NOTE) The eGFR has been calculated using the CKD EPI equation. This calculation has not been validated in all clinical situations. eGFR's persistently <60 mL/min signify possible Chronic Kidney Disease.    Anion gap 6 5 - 15  Troponin I     Status: Abnormal   Collection Time: 02/07/15 11:56 PM  Result Value Ref Range   Troponin I 0.14 (H) <0.031 ng/mL    Comment: READ BACK AND VERIFIED WITH REBECCA LYNN AT 0032 02/08/15.PMH        PERSISTENTLY INCREASED TROPONIN VALUES IN THE RANGE OF 0.04-0.49 ng/mL CAN BE SEEN IN:       -UNSTABLE ANGINA       -CONGESTIVE HEART FAILURE       -MYOCARDITIS       -CHEST TRAUMA       -ARRYHTHMIAS       -LATE PRESENTING MYOCARDIAL INFARCTION       -COPD   CLINICAL FOLLOW-UP RECOMMENDED.   Lactic acid,  plasma     Status: None   Collection Time: 02/07/15 11:56 PM  Result Value Ref Range   Lactic Acid, Venous 0.8 0.5 - 2.0 mmol/L  Urinalysis complete, with microscopic ([facility] only)     Status: Abnormal   Collection Time: 02/08/15 12:45 AM  Result Value Ref Range   Color, Urine YELLOW (A) YELLOW   APPearance CLEAR (A) CLEAR   Glucose, UA NEGATIVE NEGATIVE mg/dL   Bilirubin Urine NEGATIVE NEGATIVE   Ketones, ur NEGATIVE NEGATIVE mg/dL   Specific Gravity, Urine 1.011 1.005 - 1.030   Hgb urine dipstick 1+ (A) NEGATIVE   pH 6.0 5.0 - 8.0   Protein, ur 30 (A) NEGATIVE mg/dL   Nitrite NEGATIVE NEGATIVE   Leukocytes, UA NEGATIVE NEGATIVE   RBC / HPF 0-5 0 - 5 RBC/hpf   WBC, UA 0-5 0 - 5 WBC/hpf   Bacteria, UA NONE SEEN NONE SEEN   Squamous Epithelial / LPF NONE SEEN NONE SEEN   Mucous PRESENT   Lactic acid, plasma     Status: None   Collection Time: 02/08/15  3:09 AM  Result Value Ref Range   Lactic Acid, Venous 1.1 0.5 - 2.0 mmol/L   Ct Head Wo Contrast  02/08/2015  CLINICAL DATA:  Altered mental status and seizure. History of major neuro cognitive disorder. EXAM: CT HEAD WITHOUT CONTRAST TECHNIQUE: Contiguous axial images were obtained from the base of the skull through the vertex without intravenous contrast. COMPARISON:  08/25/2014 FINDINGS: Diffuse cerebral atrophy. Mild ventricular dilatation consistent with central atrophy. Patchy low-attenuation changes throughout the deep white matter most likely to represent small vessel ischemic change. No mass effect or midline shift. No abnormal extra-axial fluid collections. Gray-white matter junctions are distinct. Basal cisterns are not effaced. No evidence of acute intracranial hemorrhage. No depressed skull fractures. Visualized paranasal sinuses and mastoid air cells are not opacified. Vascular calcifications. IMPRESSION: No acute intracranial abnormalities. Chronic atrophy and small vessel ischemic changes. Electronically Signed   By: Burman Nieves M.D.   On: 02/08/2015 00:30   Dg Chest Portable 1 View  02/08/2015  CLINICAL DATA:  Altered mental status and seizures. History of dementia, Alzheimer's, and seizures. EXAM: PORTABLE CHEST 1 VIEW COMPARISON:  08/25/2014 FINDINGS: Cardiac enlargement with mild vascular congestion. No edema or  consolidation. Diffuse emphysematous changes in the lungs with central interstitial process likely representing chronic bronchitis. No focal airspace disease or consolidation. No blunting of costophrenic angles. No pneumothorax. Calcified and tortuous aorta. Degenerative changes in the spine. IMPRESSION: Cardiac enlargement with mild vascular congestion. Emphysematous changes and chronic bronchitic changes in the lungs. No evidence of active pulmonary disease. Electronically Signed   By: Lucienne Capers M.D.   On: 02/08/2015 00:34    Review of Systems  Unable to perform ROS: mental status change    Blood pressure 109/57, pulse 48, temperature 99.1 F (37.3 C), temperature source Rectal, resp. rate 14, height '5\' 2"'$  (1.575 m), weight 36.9 kg (81 lb 5.6 oz), SpO2 100 %. Physical Exam  Vitals reviewed. Constitutional: She is oriented to person, place, and time. She appears well-developed. No distress.  Patient appears frail with very little subcutaneous fat  HENT:  Head: Normocephalic and atraumatic.  Mouth/Throat: Oropharynx is clear and moist.  Eyes: Conjunctivae and EOM are normal. Pupils are equal, round, and reactive to light. No scleral icterus.  Neck: Normal range of motion. Neck supple. No JVD present. No tracheal deviation present. No thyromegaly present.  Cardiovascular: Normal rate, regular rhythm and normal heart sounds.  Exam reveals no gallop and no friction rub.   No murmur heard. Respiratory: Effort normal and breath sounds normal.  GI: Soft. Bowel sounds are normal. She exhibits no distension. There is no tenderness.  Genitourinary:  Deferred  Musculoskeletal: She exhibits no  edema.  Patient is not responsive and will not follow commands  Lymphadenopathy:    She has no cervical adenopathy.  Neurological: She is alert and oriented to person, place, and time. No cranial nerve deficit. She exhibits normal muscle tone.  Skin: Skin is warm and dry. No rash noted. No erythema.  Psychiatric: She has a normal mood and affect. Her behavior is normal. Judgment and thought content normal.     Assessment/Plan This is a 69 year old female admitted for seizure-like activity and elevated troponin. 1. Seizure-like activity: Possible status epilepticus. No head bleed on CT scan. It is unclear if some of the patient's "twitching" is muscle spasm secondary to the underlying cause of her altered mental status or if she is actually seizing despite antiepileptic medication. Family reports no known seizure disorder. Notably, she has not been taking any of her medicines as her niece has found them in the trash can after filling them at the pharmacy. At this time, it is unknown specifically what she takes. Monitor for seizure activity. Consult neurology. Load again with Keppra if necessary. 2. Elevated troponin: No EKG evidence of myocardial infarction. Increase in troponin may be secondary to prolonged seizure activity. Continue to follow cardiac biomarkers. 3. Malnourishment: The patient's BMI is 14.9; will supplement caloric intake with dextrose. Once patient regains consciousness and is able to eat by mouth we'll obtain nutrition consultation for appropriate dietary needs. 4. Dementia: Likely the underlying cause of malnourishment and or altered mental status and seizure activity. The patient's niece who is her primary caregiver reports uneaten food in the refrigerator as well as multifocal lying around the house is in disarray. Will need placement following hospitalization as the conditions of her home are not suitable for safe. 5. DVT prophylaxis: Heparin 6. GI prophylaxis: None The  patient is currently a full code. Time spent on admission orders and patient care approximate 45 minutes  Harrie Foreman 02/08/2015, 4:44 AM

## 2015-02-08 NOTE — Consult Note (Signed)
CC: seizures   HPI: Traci Holmes is an 69 y.o. female  pesents to the emergency department via EMS after being found in her home unresponsive in what appeared to be seizure-like activity. The patient's niece reports that the patient called to complain about her legs feeling numb and tingly. The niece arrived at the patient's home approximately 5 minutes later and was unable to gain entry. Found to have seizures. S/p ativan and load keppra.  No hx of seizures in the past.   Past Medical History  Diagnosis Date  . Major neurocognitive disorder, unspecified 08/25/2014  . Dementia   . Alzheimer disease     No past surgical history on file.  No family history on file.  Social History:  reports that she has been smoking Cigarettes.  She has been smoking about 1.00 pack per day. She does not have any smokeless tobacco history on file. She reports that she does not drink alcohol or use illicit drugs.  Allergies  Allergen Reactions  . Penicillins Other (See Comments)    Reaction: unknown  Patient cannot answer follow-up questions    Medications: I have reviewed the patient's current medications.  ROS: Unable to obtain as on bipap  Physical Examination: Blood pressure 103/37, pulse 47, temperature 97.1 F (36.2 C), temperature source Axillary, resp. rate 18, height '5\' 2"'$  (1.575 m), weight 81 lb 5.6 oz (36.9 kg), SpO2 100 %.    Neurological Examination Mental Status: awake Cranial Nerves: II: Discs flat bilaterally; Visual fields grossly normal, pupils equal, round, reactive to light and accommodation III,IV, VI: ptosis not present, extra-ocular motions intact bilaterally V,VII: smile symmetric, facial light touch sensation normal bilaterally VIII: hearing normal bilaterally  Motor: Right : Upper extremity   4/5    Left:     Upper extremity   4/5  Lower extremity   4/5     Lower extremity   4/5 Tone and bulk:normal tone throughout; no atrophy noted Sensory: Pinprick and light  touch intact throughout, bilaterally Deep Tendon Reflexes: 2+ and symmetric throughout Plantars: Right: downgoing   Left: downgoing Cerebellar: Not tested.  Gait: not tested      Laboratory Studies:   Basic Metabolic Panel:  Recent Labs Lab 02/07/15 2356  NA 146*  K 3.8  CL 110  CO2 30  GLUCOSE 97  BUN 16  CREATININE 0.47  CALCIUM 8.8*    Liver Function Tests:  Recent Labs Lab 02/07/15 2356  AST 26  ALT 14  ALKPHOS 51  BILITOT 0.5  PROT 6.5  ALBUMIN 3.7   No results for input(s): LIPASE, AMYLASE in the last 168 hours. No results for input(s): AMMONIA in the last 168 hours.  CBC:  Recent Labs Lab 02/07/15 2356  WBC 9.0  HGB 14.1  HCT 43.0  MCV 101.8*  PLT 212    Cardiac Enzymes:  Recent Labs Lab 02/07/15 0330 02/07/15 2356 02/08/15 0437 02/08/15 1029  CKTOTAL 156  --   --   --   TROPONINI  --  0.14* 0.83* 0.47*    BNP: Invalid input(s): POCBNP  CBG: No results for input(s): GLUCAP in the last 168 hours.  Microbiology: Results for orders placed or performed during the hospital encounter of 02/07/15  Blood culture (routine x 2)     Status: None (Preliminary result)   Collection Time: 02/07/15 11:56 PM  Result Value Ref Range Status   Specimen Description BLOOD RIGHT ARM  Final   Special Requests BOTTLES DRAWN AEROBIC AND ANAEROBIC  4CC  Final   Culture NO GROWTH < 12 HOURS  Final   Report Status PENDING  Incomplete  Blood culture (routine x 2)     Status: None (Preliminary result)   Collection Time: 02/07/15 11:58 PM  Result Value Ref Range Status   Specimen Description BLOOD RIGHT ARM  Final   Special Requests BOTTLES DRAWN AEROBIC AND ANAEROBIC 4CC  Final   Culture NO GROWTH < 12 HOURS  Final   Report Status PENDING  Incomplete  MRSA PCR Screening     Status: Abnormal   Collection Time: 02/08/15  4:32 AM  Result Value Ref Range Status   MRSA by PCR POSITIVE (A) NEGATIVE Final    Comment:        The GeneXpert MRSA Assay  (FDA approved for NASAL specimens only), is one component of a comprehensive MRSA colonization surveillance program. It is not intended to diagnose MRSA infection nor to guide or monitor treatment for MRSA infections. CRITICAL RESULT CALLED TO, READ BACK BY AND VERIFIED WITH: ADAM Jasper ON 02/08/15 AT 0630 BY QSD     Coagulation Studies: No results for input(s): LABPROT, INR in the last 72 hours.  Urinalysis:  Recent Labs Lab 02/08/15 0045  COLORURINE YELLOW*  LABSPEC 1.011  PHURINE 6.0  GLUCOSEU NEGATIVE  HGBUR 1+*  BILIRUBINUR NEGATIVE  KETONESUR NEGATIVE  PROTEINUR 30*  NITRITE NEGATIVE  LEUKOCYTESUR NEGATIVE    Lipid Panel:     Component Value Date/Time   CHOL 203* 09/06/2012 0708   TRIG 67 09/06/2012 0708   HDL 71* 09/06/2012 0708   VLDL 13 09/06/2012 0708   LDLCALC 119* 09/06/2012 0708    HgbA1C: No results found for: HGBA1C  Urine Drug Screen:     Component Value Date/Time   LABOPIA NONE DETECTED 08/25/2014 1325   LABBENZ NONE DETECTED 08/25/2014 1325   AMPHETMU NONE DETECTED 08/25/2014 1325   THCU NONE DETECTED 08/25/2014 1325   LABBARB NONE DETECTED 08/25/2014 1325    Alcohol Level: No results for input(s): ETH in the last 168 hours.  Other results: EKG: normal EKG, normal sinus rhythm, unchanged from previous tracings.  Imaging: Ct Head Wo Contrast  02/08/2015  CLINICAL DATA:  Altered mental status and seizure. History of major neuro cognitive disorder. EXAM: CT HEAD WITHOUT CONTRAST TECHNIQUE: Contiguous axial images were obtained from the base of the skull through the vertex without intravenous contrast. COMPARISON:  08/25/2014 FINDINGS: Diffuse cerebral atrophy. Mild ventricular dilatation consistent with central atrophy. Patchy low-attenuation changes throughout the deep white matter most likely to represent small vessel ischemic change. No mass effect or midline shift. No abnormal extra-axial fluid collections. Gray-white matter  junctions are distinct. Basal cisterns are not effaced. No evidence of acute intracranial hemorrhage. No depressed skull fractures. Visualized paranasal sinuses and mastoid air cells are not opacified. Vascular calcifications. IMPRESSION: No acute intracranial abnormalities. Chronic atrophy and small vessel ischemic changes. Electronically Signed   By: Lucienne Capers M.D.   On: 02/08/2015 00:30   Dg Chest Portable 1 View  02/08/2015  CLINICAL DATA:  Altered mental status and seizures. History of dementia, Alzheimer's, and seizures. EXAM: PORTABLE CHEST 1 VIEW COMPARISON:  08/25/2014 FINDINGS: Cardiac enlargement with mild vascular congestion. No edema or consolidation. Diffuse emphysematous changes in the lungs with central interstitial process likely representing chronic bronchitis. No focal airspace disease or consolidation. No blunting of costophrenic angles. No pneumothorax. Calcified and tortuous aorta. Degenerative changes in the spine. IMPRESSION: Cardiac enlargement with mild vascular congestion. Emphysematous changes and  chronic bronchitic changes in the lungs. No evidence of active pulmonary disease. Electronically Signed   By: Lucienne Capers M.D.   On: 02/08/2015 00:34     Assessment/Plan:  69 y.o. female  pesents to the emergency department via EMS after being found in her home unresponsive in what appeared to be seizure-like activity. The patient's niece reports that the patient called to complain about her legs feeling numb and tingly. The niece arrived at the patient's home approximately 5 minutes later and was unable to gain entry. Found to have seizures. S/p ativan and load keppra.  No hx of seizures in the past.  - Con't Keppra - MRI brain w/ and w/out contrast - hypercapnic respiratory failure with elevated CO2 that can lower sz threshold.  - EEG tomorrow  02/08/2015, 1:31 PM

## 2015-02-08 NOTE — ED Notes (Signed)
Dr Marcille Blanco in with patient and family.

## 2015-02-08 NOTE — Progress Notes (Signed)
   02/08/15 0000  Clinical Encounter Type  Visited With Family  Visit Type Initial  Referral From Nurse  Spiritual Encounters  Spiritual Needs Emotional  Chaplain engaged family of patient and offered prayer and support. Engaged teenager within the family. Offered support to teen and engaged her on how she was coping with patient's situation.

## 2015-02-08 NOTE — ED Notes (Signed)
Bruising noted to left side of face at her left eye, niece reports that bruising was not there yesterday when she visited.

## 2015-02-08 NOTE — ED Notes (Signed)
Family member that is with the patient is her niece.  Niece states she checks on the patient at much as she can, but that she is not familiar with her history, medications or allergies.

## 2015-02-08 NOTE — ED Notes (Signed)
Patient resting with eyes closed at this time.  Family at bedside.  Patient continues with "twitching" movement occasionally.

## 2015-02-09 ENCOUNTER — Inpatient Hospital Stay: Payer: Medicare Other

## 2015-02-09 DIAGNOSIS — Z720 Tobacco use: Secondary | ICD-10-CM

## 2015-02-09 DIAGNOSIS — I517 Cardiomegaly: Secondary | ICD-10-CM

## 2015-02-09 DIAGNOSIS — E43 Unspecified severe protein-calorie malnutrition: Secondary | ICD-10-CM | POA: Insufficient documentation

## 2015-02-09 DIAGNOSIS — J9601 Acute respiratory failure with hypoxia: Secondary | ICD-10-CM

## 2015-02-09 LAB — CBC
HEMATOCRIT: 42.9 % (ref 35.0–47.0)
Hemoglobin: 13.7 g/dL (ref 12.0–16.0)
MCH: 33 pg (ref 26.0–34.0)
MCHC: 32 g/dL (ref 32.0–36.0)
MCV: 103.3 fL — AB (ref 80.0–100.0)
Platelets: 162 10*3/uL (ref 150–440)
RBC: 4.15 MIL/uL (ref 3.80–5.20)
RDW: 15.5 % — ABNORMAL HIGH (ref 11.5–14.5)
WBC: 5.1 10*3/uL (ref 3.6–11.0)

## 2015-02-09 LAB — URINE CULTURE: CULTURE: NO GROWTH

## 2015-02-09 MED ORDER — POTASSIUM CHLORIDE 10 MEQ/100ML IV SOLN
10.0000 meq | INTRAVENOUS | Status: AC
  Administered 2015-02-09 (×4): 10 meq via INTRAVENOUS
  Filled 2015-02-09 (×4): qty 100

## 2015-02-09 MED ORDER — ALBUTEROL SULFATE (2.5 MG/3ML) 0.083% IN NEBU: 2.5000 mg | INHALATION_SOLUTION | RESPIRATORY_TRACT | Status: DC | PRN

## 2015-02-09 MED ORDER — CETYLPYRIDINIUM CHLORIDE 0.05 % MT LIQD
7.0000 mL | Freq: Two times a day (BID) | OROMUCOSAL | Status: DC
  Administered 2015-02-09 – 2015-02-10 (×4): 7 mL via OROMUCOSAL

## 2015-02-09 MED ORDER — POTASSIUM CHLORIDE CRYS ER 20 MEQ PO TBCR: 40.0000 meq | EXTENDED_RELEASE_TABLET | Freq: Once | ORAL | Status: DC

## 2015-02-09 MED ORDER — LEVETIRACETAM 500 MG PO TABS
500.0000 mg | ORAL_TABLET | Freq: Two times a day (BID) | ORAL | Status: DC
  Administered 2015-02-10 – 2015-02-14 (×9): 500 mg via ORAL
  Filled 2015-02-09 (×10): qty 1

## 2015-02-09 MED ORDER — ENOXAPARIN SODIUM 30 MG/0.3ML ~~LOC~~ SOLN
30.0000 mg | SUBCUTANEOUS | Status: DC
  Administered 2015-02-09: 30 mg via SUBCUTANEOUS
  Filled 2015-02-09: qty 0.3

## 2015-02-09 MED ORDER — GADOBENATE DIMEGLUMINE 529 MG/ML IV SOLN
10.0000 mL | Freq: Once | INTRAVENOUS | Status: AC | PRN
  Administered 2015-02-09: 7 mL via INTRAVENOUS

## 2015-02-09 MED ORDER — ASPIRIN 81 MG PO CHEW
81.0000 mg | CHEWABLE_TABLET | Freq: Every day | ORAL | Status: DC
  Administered 2015-02-10 – 2015-02-14 (×5): 81 mg via ORAL
  Filled 2015-02-09 (×5): qty 1

## 2015-02-09 NOTE — Progress Notes (Signed)
Patient twitching in upper extremities and neck. However she is able to respond to me. Dr. Lavetta Nielsen notified. No new orders. Will continue to monitor.

## 2015-02-09 NOTE — Progress Notes (Addendum)
Lethargic. Skin checked by Amy D RN Unable to give meds. High fall risk. K was IV replaced. Pt has no further concerns at this time.

## 2015-02-09 NOTE — Progress Notes (Signed)
[facility] NAME: Traci Holmes    MR#:  [id number]  DATE OF BIRTH:  November 27, 1945  SUBJECTIVE:  CHIEF COMPLAINT:  Patient is more awake and alert. off BiPAP. Answering few questions No family members at bedside  REVIEW OF SYSTEMS:  CONSTITUTIONAL: No fever, fatigue or weakness.  EYES: No blurred or double vision.  EARS, NOSE, AND THROAT: No tinnitus or ear pain.  RESPIRATORY: No cough, shortness of breath, wheezing or hemoptysis.  CARDIOVASCULAR: No chest pain, orthopnea, edema.  GASTROINTESTINAL: No nausea, vomiting, diarrhea  GENITOURINARY: No dysuria, hematuria.  ENDOCRINE: No polyuria, nocturia,  HEMATOLOGY: No anemia, easy bruising or bleeding SKIN: No rash or lesion. MUSCULOSKELETAL: No joint pain or arthritis.  NEUROLOGIC: No tingling, numbness, weakness.  PSYCHIATRY: No anxiety or depression.   DRUG ALLERGIES:   Allergies  Allergen Reactions  . Penicillins Other (See Comments)    Reaction: unknown  Patient cannot answer follow-up questions    VITALS:  Blood pressure 139/72, pulse 54, temperature 98.5 F (36.9 C), temperature source Oral, resp. rate 13, height '5\' 2"'$  (1.575 m), weight 38 kg (83 lb 12.4 oz), SpO2 100 %.  PHYSICAL EXAMINATION:  GENERAL:  69 y.o.-year-old patient lying in the bed with no acute distress.  EYES: Pupils equal, round, reactive to light and accommodation. No scleral icterus.  HEENT: Head atraumatic, normocephalic. Oropharynx and nasopharynx clear. Some bruises are noticed on the face NECK:  Supple, no jugular venous distention. No thyroid enlargement, no tenderness.  LUNGS: Diminished breath sounds bilaterally, no wheezing, rales,rhonchi or crepitation. No use of accessory muscles of respiration. On BiPAP CARDIOVASCULAR: S1, S2 normal. No murmurs, rubs, or gallops.  ABDOMEN: Soft, nontender, nondistended. Bowel sounds present. No organomegaly or mass.  EXTREMITIES: No pedal  edema, cyanosis, or clubbing.  NEUROLOGIC: Altered mental status. Arousable to verbal commands Gait not checked.  PSYCHIATRIC: The patient is lethargic SKIN: No obvious rash, lesion, or ulcer.    LABORATORY PANEL:   CBC  Recent Labs Lab 02/09/15 0447  WBC 5.1  HGB 13.7  HCT 42.9  PLT 162   ------------------------------------------------------------------------------------------------------------------  Chemistries   Recent Labs Lab 02/08/15 1626  NA 148*  K 3.2*  CL 117*  CO2 26  GLUCOSE 85  BUN 11  CREATININE 0.36*  CALCIUM 8.2*  AST 25  ALT 12*  ALKPHOS 40  BILITOT 0.3   ------------------------------------------------------------------------------------------------------------------  Cardiac Enzymes  Recent Labs Lab 02/08/15 1626  TROPONINI 0.36*   ------------------------------------------------------------------------------------------------------------------  RADIOLOGY:  Ct Head Wo Contrast  02/08/2015  CLINICAL DATA:  Altered mental status and seizure. History of major neuro cognitive disorder. EXAM: CT HEAD WITHOUT CONTRAST TECHNIQUE: Contiguous axial images were obtained from the base of the skull through the vertex without intravenous contrast. COMPARISON:  08/25/2014 FINDINGS: Diffuse cerebral atrophy. Mild ventricular dilatation consistent with central atrophy. Patchy low-attenuation changes throughout the deep white matter most likely to represent small vessel ischemic change. No mass effect or midline shift. No abnormal extra-axial fluid collections. Gray-white matter junctions are distinct. Basal cisterns are not effaced. No evidence of acute intracranial hemorrhage. No depressed skull fractures. Visualized paranasal sinuses and mastoid air cells are not opacified. Vascular calcifications. IMPRESSION: No acute intracranial abnormalities. Chronic atrophy and small vessel ischemic changes. Electronically Signed   By: Lucienne Capers M.D.   On:  02/08/2015 00:30   Mr Jeri Cos TD Contrast  02/09/2015  CLINICAL DATA:  Seizure EXAM: MRI HEAD WITHOUT AND WITH CONTRAST TECHNIQUE: Multiplanar, multiecho  pulse sequences of the brain and surrounding structures were obtained without and with intravenous contrast. CONTRAST:  77m MULTIHANCE GADOBENATE DIMEGLUMINE 529 MG/ML IV SOLN COMPARISON:  CT head 02/08/2015 FINDINGS: Image quality degraded by moderate motion. All sequences degraded by motion. Mild atrophy.  Negative for hydrocephalus. Negative for acute infarct. Moderate chronic microvascular ischemic change in the cerebral white matter and pons. Negative for intracranial hemorrhage. Negative for mass or edema.  No shift of the midline structures. Postcontrast imaging reveals normal enhancement. No enhancing mass lesion. Vascular enhancement is normal. Paranasal sinuses clear. Normal orbit bilaterally. Pituitary not enlarged. IMPRESSION: Image quality degraded by moderate motion Chronic microvascular ischemic change. No acute intracranial abnormality. Electronically Signed   By: CFranchot GalloM.D.   On: 02/09/2015 12:19   Dg Chest Portable 1 View  02/08/2015  CLINICAL DATA:  Altered mental status and seizures. History of dementia, Alzheimer's, and seizures. EXAM: PORTABLE CHEST 1 VIEW COMPARISON:  08/25/2014 FINDINGS: Cardiac enlargement with mild vascular congestion. No edema or consolidation. Diffuse emphysematous changes in the lungs with central interstitial process likely representing chronic bronchitis. No focal airspace disease or consolidation. No blunting of costophrenic angles. No pneumothorax. Calcified and tortuous aorta. Degenerative changes in the spine. IMPRESSION: Cardiac enlargement with mild vascular congestion. Emphysematous changes and chronic bronchitic changes in the lungs. No evidence of active pulmonary disease. Electronically Signed   By: WLucienne CapersM.D.   On: 02/08/2015 00:34    EKG:   Orders placed or performed during  the hospital encounter of 02/07/15  . ED EKG  . ED EKG  . EKG 12-Lead  . EKG 12-Lead    ASSESSMENT AND PLAN:  This is a 69year old female admitted for seizure-like activity and elevated troponin. #. Altered mental status probably post ictal with acute respiratory failure with hypercapnia on her baseline Alzheimer's dementia No head bleed on CT scan.    #Acute respiratory failure with hypercapnia which could lower the seizure threshold  The patient is off BiPAP Continue close monitoring. Appreciate pulmonology recommendations  #New-onset seizures Continue Keppra 500 mg by mouth twice a day Neurology recommended aspirin 81 mg by mouth once daily No driving or operating heavy missionary for the next 6 months [facility] Appreciate neurology's recommendations  #. Elevated troponin: No EKG evidence of myocardial infarction. Increase in troponin may be secondary to prolonged seizure activity. Troponins are trending down  #. Malnourishment: With failure to thrive: The patient's BMI is 14.9; Consult dietitian The patient's niece who is her primary caregiver reports uneaten food in the refrigerator as well as multifocal lying around the house is in disarray  #. Dementia: Probably from Alzheimer's disease.   #. DVT prophylaxis: Heparin #. GI prophylaxis: ppi  #Generalized weakness, failure to thrive-physical therapy consult   All the records are reviewed and case discussed with Care Management/Social Workerr. Management plans discussed with the patient, family and they are in agreement.  CODE STATUS: Full code  TOTAL critical care  TIME TAKING CARE OF THIS PATIENT: 35 minutes.   POSSIBLE D/C IN 3-4 DAYS, DEPENDING ON CLINICAL CONDITION.   GNicholes MangoM.D on 02/09/2015 at 5:32 PM  Between 7am to 6pm - Pager - [phone] 6pm go to [email or web address] - password [email or web address]  Office  [phone] CC: Primary care  physician; RICE, EAlinda Deem PA-C

## 2015-02-09 NOTE — Progress Notes (Addendum)
Initial Nutrition Assessment  DOCUMENTATION CODES:   Severe malnutrition in context of chronic illness  INTERVENTION:  Meals and snacks: Await diet progression per MD Medical Nutrition Supplement Therapy: Recommend Ensure Enlive po BID, each supplement provides 350 kcal and 20 grams of protein once able to take po     NUTRITION DIAGNOSIS:   Malnutrition related to poor appetite, chronic illness as evidenced by severe depletion of body fat, severe depletion of muscle mass.    GOAL:   Patient will meet greater than or equal to 90% of their needs    MONITOR:    (Energy intake, Anthropometrics)  REASON FOR ASSESSMENT:   Consult Assessment of nutrition requirement/status  ASSESSMENT:      Pt admitted with AMS, seizure like activity, elevated troponin. Neurology following  Past Medical History  Diagnosis Date  . Major neurocognitive disorder, unspecified 08/25/2014  . Dementia   . Alzheimer disease     Current Nutrition: NPO  Food/Nutrition-Related History: Pt confused and unable to answer questions this am, no family at bedside Noted per MD note niece reports uneaten food in refrigerator   Scheduled Medications:  . antiseptic oral rinse  7 mL Mouth Rinse BID  . chlorhexidine  15 mL Mouth/Throat BID  . Chlorhexidine Gluconate Cloth  6 each Topical Q0600  . heparin  5,000 Units Subcutaneous 3 times per day  . levETIRAcetam  500 mg Intravenous Q12H  . mupirocin ointment  1 application Nasal BID  . sodium chloride  3 mL Intravenous Q12H      Electrolyte/Renal Profile and Glucose Profile:   Recent Labs Lab 02/07/15 2356 02/08/15 1626  NA 146* 148*  K 3.8 3.2*  CL 110 117*  CO2 30 26  BUN 16 11  CREATININE 0.47 0.36*  CALCIUM 8.8* 8.2*  GLUCOSE 97 85   Protein Profile:  Recent Labs Lab 02/07/15 2356 02/08/15 1626  ALBUMIN 3.7 3.0*    Gastrointestinal Profile: Last BM: PTA   Nutrition-Focused Physical Exam Findings: Nutrition-Focused  physical exam completed. Findings are severe fat depletion, severe muscle depletion, and no edema.      Weight Change: 17% wt loss in the last 5 months per wt encounters  Wt Readings from Last 10 Encounters:  02/09/15 83 lb 12.4 oz (38 kg)  08/25/14 100 lb (45.36 kg)      Diet Order:  Diet NPO time specified  Skin:   reviewed    Height:   Ht Readings from Last 1 Encounters:  02/07/15 '5\' 2"'$  (1.575 m)    Weight:   Wt Readings from Last 1 Encounters:  02/09/15 83 lb 12.4 oz (38 kg)    Ideal Body Weight:     BMI:  Body mass index is 15.32 kg/(m^2).  Estimated Nutritional Needs:   Kcal:  BEE 973 kcals (IF 1.0-1.2, AF 1.3) 6256-3893 kcals/d. Using IBW of 50kg  Protein:  (1.0-1.3 g/kg) 50-65 g/d  Fluid:  (30-12m/kg) 1500-17555md  EDUCATION NEEDS:   No education needs identified at this time  HIGH Care Level  Tannar Broker B. AlZenia ResidesRDBerryvilleLDOak Harborpager)

## 2015-02-09 NOTE — Care Management (Signed)
Patient presents from home.  It is very difficult to get history from patient.  Says she does not have a history of previous seizure disorder.  Says she was not on any medications prior to this admission.  She does not answer when asked if she lives alone or if she has any support in the home.  She does not indicate that she has any family members or friends  that can offer support . Says that she is able to drive.    She is unable to provide name of her PCP and the last time she was seen.  Left voicemail message for patient's son Thressa Sheller.

## 2015-02-09 NOTE — Progress Notes (Signed)
bipap removed from patient for trial.  She intermittently responds appropriately to questions.  She has slow speech.  Oral care provided. Pt on [place] at 3lpm.

## 2015-02-09 NOTE — Progress Notes (Signed)
Calm, poorly oriented. Somewhat odd affect. No distress  Filed Vitals:   02/09/15 0800 02/09/15 0900 02/09/15 1000 02/09/15 1100  BP: 121/49 141/52 131/50 137/66  Pulse: 45 59 50 51  Temp: 98.4 F (36.9 C)     TempSrc: Oral     Resp: '14 20 17 18  '$ Height:      Weight:      SpO2: 100% 100% 92% 99%   Very thin, cachectic HEENT WNL Markedly diminished BS, no wheezes Bradycardic, no M noted abd soft, NT No LE edema No focal neuro deficits  BMP Latest Ref Rng 02/08/2015 02/07/2015 08/25/2014  Glucose 65 - 99 mg/dL 85 97 208(H)  BUN 6 - 20 mg/dL 11 16 40(H)  Creatinine 0.44 - 1.00 mg/dL 0.36(L) 0.47 0.75  Sodium 135 - 145 mmol/L 148(H) 146(H) 141  Potassium 3.5 - 5.1 mmol/L 3.2(L) 3.8 3.3(L)  Chloride 101 - 111 mmol/L 117(H) 110 101  CO2 22 - 32 mmol/L '26 30 29  '$ Calcium 8.9 - 10.3 mg/dL 8.2(L) 8.8(L) 9.9    CBC Latest Ref Rng 02/09/2015 02/07/2015 08/25/2014  WBC 3.6 - 11.0 K/uL 5.1 9.0 5.6  Hemoglobin 12.0 - 16.0 g/dL 13.7 14.1 15.1  Hematocrit 35.0 - 47.0 % 42.9 43.0 46.6  Platelets 150 - 440 K/uL 162 212 166    CXR 12/18: hyperinflated, cardiomegaly  EKG 12/18: Sinus, rate 62, PRWP, no acute ischemia  IMPRESSION: Acute hypercarbic respiratory failure - clinically resolved Smoker, CXR and exam c/w emphysema - no wheezes AMS, concern for seizure/post ictal state - clinically resolved  CT head without acute abn Cardiomegaly on CXR, sinus bradycardia Severe undernutrition  PLAN/REC: Echocardiogram ordered 12/19 Needs smoking cessation Transfer to telemetry if OK with Dr Margaretmary Eddy Neurology following - MRI ordered. On Keppra Begin diet and advance as tolerated  PCCM will sign off. Please call if we can be of further assistance  Merton Border, MD PCCM service Mobile [phone] Pager [phone]

## 2015-02-09 NOTE — Progress Notes (Signed)
Pharmacy Consult for Lovenox Indication: VTE prophylaxis  Allergies  Allergen Reactions  . Penicillins Other (See Comments)    Reaction: unknown  Patient cannot answer follow-up questions    Patient Measurements: Height: '5\' 2"'$  (157.5 cm) Weight: 83 lb 12.4 oz (38 kg) IBW/kg (Calculated) : 50.1  Vital Signs: Temp: 98.4 F (36.9 C) (12/19 0800) Temp Source: Oral (12/19 0800) BP: 131/50 mmHg (12/19 1000) Pulse Rate: 50 (12/19 1000)  Labs:  Recent Labs  02/07/15 0330  02/07/15 2356 02/08/15 0437 02/08/15 1029 02/08/15 1626 02/09/15 0447  HGB  --   --  14.1  --   --   --  13.7  HCT  --   --  43.0  --   --   --  42.9  PLT  --   --  212  --   --   --  162  CREATININE  --   --  0.47  --   --  0.36*  --   CKTOTAL 156  --   --   --   --   --   --   TROPONINI  --   < > 0.14* 0.83* 0.47* 0.36*  --   < > = values in this interval not displayed.  Estimated Creatinine Clearance: 39.3 mL/min (by C-G formula based on Cr of 0.36).   Medical History: Past Medical History  Diagnosis Date  . Major neurocognitive disorder, unspecified 08/25/2014  . Dementia   . Alzheimer disease     Medications:  Scheduled:  . antiseptic oral rinse  7 mL Mouth Rinse BID  . Chlorhexidine Gluconate Cloth  6 each Topical Q0600  . enoxaparin (LOVENOX) injection  30 mg Subcutaneous Q24H  . levETIRAcetam  500 mg Intravenous Q12H  . mupirocin ointment  1 application Nasal BID  . sodium chloride  3 mL Intravenous Q12H   Infusions:    Assessment: 69 y/o F admitted with new-onset seizures previously on heparin for dvt prophylaxis to change to LMWH. Patient weight is 38 kg.   Plan:  Patient received heparin 5000 units [place] at 0600. Will begin Lovenox 30 mg daily at 1400. Will sign off but continue to follow anticoagulation monitoring.   Ulice Dash D 02/09/2015,10:56 AM

## 2015-02-09 NOTE — Progress Notes (Signed)
NEUROLOGY NOTE  S:  No more seizures, extubated  ROS unobtainable secondary to hoarse voice  O:  98.5    153/59    48    15 Mild distress, thin Normocephalic, oropharynx clear Supple, no JVD Mild wheezing, good movement RRR, no murmur No C/C/E  A+Ox2 not time, follows, moderate dysartria PERRLA, EOMI, face symmetric 4/5 B  MRI personally reviewed by me and shows chronic white matter changes  A/P: 1.  Possible seizure-  Likely provoked by acute medical problems 2.  Mild white matter changes-  Likely causing some chronic memory problems -  Needs ASA '81mg'$  daily -  Continue Keppra '500mg'$  BID -  No driving or operating heavy machinery x 6 months -  Will sign off, please call with questions -  F/U with [facility] in 3 months

## 2015-02-10 ENCOUNTER — Inpatient Hospital Stay
Admit: 2015-02-10 | Discharge: 2015-02-10 | Disposition: A | Discharge status: Home / Self Care | Payer: Medicare Other | Attending: Pulmonary Disease | Admitting: Pulmonary Disease

## 2015-02-10 DIAGNOSIS — G40401 Other generalized epilepsy and epileptic syndromes, not intractable, with status epilepticus: Secondary | ICD-10-CM

## 2015-02-10 DIAGNOSIS — R778 Other specified abnormalities of plasma proteins: Secondary | ICD-10-CM | POA: Insufficient documentation

## 2015-02-10 DIAGNOSIS — R7989 Other specified abnormal findings of blood chemistry: Secondary | ICD-10-CM | POA: Insufficient documentation

## 2015-02-10 DIAGNOSIS — E43 Unspecified severe protein-calorie malnutrition: Secondary | ICD-10-CM

## 2015-02-10 DIAGNOSIS — I248 Other forms of acute ischemic heart disease: Secondary | ICD-10-CM

## 2015-02-10 DIAGNOSIS — R4182 Altered mental status, unspecified: Secondary | ICD-10-CM

## 2015-02-10 DIAGNOSIS — I498 Other specified cardiac arrhythmias: Secondary | ICD-10-CM

## 2015-02-10 DIAGNOSIS — R569 Unspecified convulsions: Secondary | ICD-10-CM | POA: Insufficient documentation

## 2015-02-10 DIAGNOSIS — R001 Bradycardia, unspecified: Secondary | ICD-10-CM | POA: Insufficient documentation

## 2015-02-10 LAB — BASIC METABOLIC PANEL
Anion gap: 7 (ref 5–15)
BUN: 19 mg/dL (ref 6–20)
CHLORIDE: 108 mmol/L (ref 101–111)
CO2: 29 mmol/L (ref 22–32)
CREATININE: 0.43 mg/dL — AB (ref 0.44–1.00)
Calcium: 8.8 mg/dL — ABNORMAL LOW (ref 8.9–10.3)
GFR calc non Af Amer: >60 mL/min (ref >60)
Glucose, Bld: 114 mg/dL — ABNORMAL HIGH (ref 65–99)
POTASSIUM: 3.7 mmol/L (ref 3.5–5.1)
Sodium: 144 mmol/L (ref 135–145)

## 2015-02-10 LAB — GLUCOSE, CAPILLARY: Glucose-Capillary: 82 mg/dL (ref 65–99)

## 2015-02-10 MED ORDER — ENSURE ENLIVE PO LIQD
237.0000 mL | Freq: Three times a day (TID) | ORAL | Status: DC
  Administered 2015-02-10 – 2015-02-13 (×8): 237 mL via ORAL

## 2015-02-10 MED ORDER — ENOXAPARIN SODIUM 40 MG/0.4ML ~~LOC~~ SOLN: 40.0000 mg | SUBCUTANEOUS | Status: DC

## 2015-02-10 NOTE — Clinical Social Work Placement (Signed)
   CLINICAL SOCIAL WORK PLACEMENT  NOTE  Date:  02/10/2015  Patient Details  Name: Traci Holmes MRN: [id number] Date of Birth: [birth year]  Clinical Social Work is seeking post-discharge placement for this patient at the [place] level of care (*CSW will initial, date and re-position this form in  chart as items are completed):  Yes   Patient/family provided with [place] Work Department's list of facilities offering this level of care within the geographic area requested by the patient (or if unable, by the patient's family).  Yes   Patient/family informed of their freedom to choose among providers that offer the needed level of care, that participate in Medicare, Medicaid or managed care program needed by the patient, have an available bed and are willing to accept the patient.  Yes   Patient/family informed of [facility]'s ownership interest in [facility] and [facility], as well as of the fact that they are under no obligation to receive care at these facilities.  PASRR submitted to EDS on       PASRR number received on       Existing PASRR number confirmed on 02/10/15     FL2 transmitted to all facilities in geographic area requested by pt/family on 02/10/15     FL2 transmitted to all facilities within larger geographic area on       Patient informed that his/her managed care company has contracts with or will negotiate with certain facilities, including the following:            Patient/family informed of bed offers received.  Patient chooses bed at       Physician recommends and patient chooses bed at      Patient to be transferred to   on  .  Patient to be transferred to facility by       Patient family notified on   of transfer.  Name of family member notified:        PHYSICIAN       Additional Comment:    _______________________________________________ Loralyn Freshwater, LCSW 02/10/2015, 11:54 AM

## 2015-02-10 NOTE — NC FL2 (Signed)
[place] LEVEL OF CARE SCREENING TOOL     IDENTIFICATION  Patient Name: Traci Holmes Birthdate: Jan 27, 1946 Sex: female Admission Date (Current Location): 02/07/2015  [place] and [place] Number:  Engineering geologist and Address:  [facility], [address], [place], [place] [address]      Provider Number: [id number]  Attending Physician Name and Address:  Nicholes Mango, MD  Relative Name and Phone Number:       Current Level of Care: Hospital Recommended Level of Care: [place] Prior Approval Number:    Date Approved/Denied:   PASRR Number:  ( [id number] )  Discharge Plan: SNF    Current Diagnoses: Patient Active Problem List   Diagnosis Date Noted  . Protein-calorie malnutrition, severe 02/09/2015  . Status epilepticus ([place]) 02/08/2015  . Dementia, old age   . Major neurocognitive disorder, unspecified 08/25/2014    Orientation RESPIRATION BLADDER Height & Weight    Self, Place  O2 (2 Liters Oxygen ) Continent '5\' 2"'$  (157.5 cm) 78 lbs.  BEHAVIORAL SYMPTOMS/MOOD NEUROLOGICAL BOWEL NUTRITION STATUS   (none )  (none ) Continent Diet (Diet: Heart Healthy/ Carb Modifed )  AMBULATORY STATUS COMMUNICATION OF NEEDS Skin   Extensive Assist Verbally Normal                       Personal Care Assistance Level of Assistance  Bathing, Feeding, Dressing Bathing Assistance: Limited assistance Feeding assistance: Limited assistance Dressing Assistance: Limited assistance     Functional Limitations Info  Sight, Hearing, Speech Sight Info: Adequate Hearing Info: Adequate Speech Info: Adequate    SPECIAL CARE FACTORS FREQUENCY  PT (By licensed PT), OT (By licensed OT)     PT Frequency:  (5) OT Frequency:  (5)            Contractures      Additional Factors Info  Code Status, Allergies, Isolation Precautions Code Status Info:  (Full Code. ) Allergies Info:  (Penicillins)     Isolation  Precautions Info:  (MRSA Nasal swab. )     Current Medications (02/10/2015):  This is the current hospital active medication list Current Facility-Administered Medications  Medication Dose Route Frequency Provider Last Rate Last Dose  . acetaminophen (TYLENOL) tablet 650 mg  650 mg Oral Q6H PRN Harrie Foreman, MD   650 mg at 02/10/15 1024   Or  . acetaminophen (TYLENOL) suppository 650 mg  650 mg Rectal Q6H PRN Harrie Foreman, MD      . albuterol (PROVENTIL) (2.5 MG/3ML) 0.083% nebulizer solution 2.5 mg  2.5 mg Nebulization Q4H PRN Wilhelmina Mcardle, MD      . antiseptic oral rinse (CPC / CETYLPYRIDINIUM CHLORIDE 0.05%) solution 7 mL  7 mL Mouth Rinse BID Nicholes Mango, MD   7 mL at 02/10/15 1023  . aspirin chewable tablet 81 mg  81 mg Oral Daily Nicholes Mango, MD   81 mg at 02/10/15 1024  . Chlorhexidine Gluconate Cloth 2 % PADS 6 each  6 each Topical Q0600 Harrie Foreman, MD   6 each at 02/10/15 0600  . enoxaparin (LOVENOX) injection 30 mg  30 mg Subcutaneous Q24H Wilhelmina Mcardle, MD   30 mg at 02/09/15 1310  . levETIRAcetam (KEPPRA) tablet 500 mg  500 mg Oral BID Nicholes Mango, MD   500 mg at 02/10/15 1024  . mupirocin ointment (BACTROBAN) 2 % 1 application  1 application Nasal BID Harrie Foreman, MD  1 application at 01/77/93 2119  . ondansetron (ZOFRAN) tablet 4 mg  4 mg Oral Q6H PRN Harrie Foreman, MD       Or  . ondansetron [facility]) injection 4 mg  4 mg Intravenous Q6H PRN Harrie Foreman, MD      . sodium chloride 0.9 % injection 3 mL  3 mL Intravenous Q12H Harrie Foreman, MD   3 mL at 02/09/15 2120     Discharge Medications: Please see discharge summary for a list of discharge medications.  Relevant Imaging Results:  Relevant Lab Results:   Additional Information  (SSN: [id number])  Loralyn Freshwater, LCSW

## 2015-02-10 NOTE — Care Management (Signed)
Attempted to speak with patient and unable to awaken her.  She moans to acknowledge when her name is called.  While CM in the room, patient's phone rang and it was patient's son Darrin Luis (listed as keith in Epic.)  Confirmed his phone number [phone].  Informed him that CM and CSW have left messages that have not been returned.  he confirms that patient has dementia.  Says that two years ago patient and Sterling's brother were sent to [facility] but patient got "kicked out" because she became "wild".  Birdena Crandall says that he has called APS in [facility] to help with his mother but was told they could not do anything because the patient was not hurting anybody and not hurting herself.  Discussed self neglect.  Have paged attending to discuss need for competency/capacity assessment.  This history began sounding familiar to CM and found documentation in [employer] substantiating that patient and her son were admitted to [facility], involvement of [place] DSS/APS and both were discharged to [facility].  There is documentation 01/28/2014 that [place] had reported patient was deemed competent by the courts and she went home and "worked very hard to get her house cleaned up."  Birdena Crandall says patient is getting more and more confused.  He is adamant that patient can not return home alone.  The house is filthy, patient is not eating, bathing.  All she is worried about are her tow dogs and cats.  Updated CSW.  There may be need for APS to reassess

## 2015-02-10 NOTE — Progress Notes (Signed)
[facility] [place] NAME: Traci Holmes    MR#:  [id number]  DATE OF BIRTH:  April 23, 1945  SUBJECTIVE:  CHIEF COMPLAINT:  Patient is more awake and moaning has dementia at baseline. Answering few questions No family members at bedside  REVIEW OF SYSTEMS:   Review of systems unobtainable today, patient is moaning DRUG ALLERGIES:   Allergies  Allergen Reactions  . Penicillins Other (See Comments)    Reaction: unknown  Patient cannot answer follow-up questions    VITALS:  Blood pressure 147/50, pulse 52, temperature 98 F (36.7 C), temperature source Oral, resp. rate 18, height '5\' 2"'$  (1.575 m), weight 35.471 kg (78 lb 3.2 oz), SpO2 96 %.  PHYSICAL EXAMINATION:  GENERAL:  69 y.o.-year-old patient lying in the bed with no acute distress.  EYES: Pupils equal, round, reactive to light and accommodation. No scleral icterus.  HEENT: Head atraumatic, normocephalic. Oropharynx and nasopharynx clear. Some bruises are noticed on the face NECK:  Supple, no jugular venous distention. No thyroid enlargement, no tenderness.  LUNGS: Moderate breath sounds bilaterally, no wheezing, rales,rhonchi or crepitation. No use of accessory muscles of respiration.  CARDIOVASCULAR: S1, S2 normal. No murmurs, rubs, or gallops.  ABDOMEN: Soft, nontender, nondistended. Bowel sounds present. No organomegaly or mass.  EXTREMITIES: No pedal edema, cyanosis, or clubbing.  NEUROLOGIC: Altered mental status. Gait not checked.  PSYCHIATRIC: The patient is disoriented SKIN: No obvious rash, lesion, or ulcer.    LABORATORY PANEL:   CBC  Recent Labs Lab 02/09/15 0447  WBC 5.1  HGB 13.7  HCT 42.9  PLT 162   ------------------------------------------------------------------------------------------------------------------  Chemistries   Recent Labs Lab 02/08/15 1626 02/10/15 1225  NA 148* 144  K 3.2* 3.7  CL 117* 108  CO2 26 29  GLUCOSE 85 114*  BUN 11  19  CREATININE 0.36* 0.43*  CALCIUM 8.2* 8.8*  AST 25  --   ALT 12*  --   ALKPHOS 40  --   BILITOT 0.3  --    ------------------------------------------------------------------------------------------------------------------  Cardiac Enzymes  Recent Labs Lab 02/08/15 1626  TROPONINI 0.36*   ------------------------------------------------------------------------------------------------------------------  RADIOLOGY:  Mr Jeri Cos Wo Contrast  02/09/2015  CLINICAL DATA:  Seizure EXAM: MRI HEAD WITHOUT AND WITH CONTRAST TECHNIQUE: Multiplanar, multiecho pulse sequences of the brain and surrounding structures were obtained without and with intravenous contrast. CONTRAST:  104m MULTIHANCE GADOBENATE DIMEGLUMINE 529 MG/ML IV SOLN COMPARISON:  CT head 02/08/2015 FINDINGS: Image quality degraded by moderate motion. All sequences degraded by motion. Mild atrophy.  Negative for hydrocephalus. Negative for acute infarct. Moderate chronic microvascular ischemic change in the cerebral white matter and pons. Negative for intracranial hemorrhage. Negative for mass or edema.  No shift of the midline structures. Postcontrast imaging reveals normal enhancement. No enhancing mass lesion. Vascular enhancement is normal. Paranasal sinuses clear. Normal orbit bilaterally. Pituitary not enlarged. IMPRESSION: Image quality degraded by moderate motion Chronic microvascular ischemic change. No acute intracranial abnormality. Electronically Signed   By: CFranchot GalloM.D.   On: 02/09/2015 12:19    EKG:   Orders placed or performed during the hospital encounter of 02/07/15  . ED EKG  . ED EKG  . EKG 12-Lead  . EKG 12-Lead    ASSESSMENT AND PLAN:  This is a 69year old female admitted for seizure-like activity and elevated troponin. #. Altered mental status probably post ictal with acute respiratory failure with hypercapnia on her baseline Alzheimer's dementia No head bleed on CT scan.   #  Malnourishment:  With failure to thrive: The patient's BMI is 14.9; Consult palliative care Appreciate dietary recommendations The patient's niece who is her primary caregiver reports uneaten food in the refrigerator as well as multifocal lying around the house is in disarray  #Acute respiratory failure with hypercapnia which could lower the seizure threshold -resolved  Continue close monitoring. Appreciate pulmonology recommendations  #New-onset seizures Continue Keppra 500 mg by mouth twice a day Neurology recommended aspirin 81 mg by mouth once daily No driving or operating heavy missionary for the next 6 months Follow-up with [facility] neurology in 3 months Appreciate neurology's recommendations  #. Elevated troponin: No EKG evidence of myocardial infarction. Increase in troponin may be secondary to prolonged seizure activity. Troponins are trending down  #. Malnourishment: With failure to thrive: The patient's BMI is 14.9; Consult dietitian The patient's niece who is her primary caregiver reports uneaten food in the refrigerator as well as multifocal lying around the house is in disarray  #. Dementia: Probably from Alzheimer's disease.   #. DVT prophylaxis: Heparin #. GI prophylaxis: ppi  #Generalized weakness, PT has recommended skilled nursing facility. Will discuss with family ,   All the records are reviewed and case discussed with Care Management/Social Workerr. Management plans discussed with the patient, family and they are in agreement.  CODE STATUS: Full code  TOTAL critical care  TIME TAKING CARE OF THIS PATIENT: 35 minutes.   POSSIBLE D/C IN 3-4 DAYS, DEPENDING ON CLINICAL CONDITION.   Nicholes Mango M.D on 02/10/2015 at 3:54 PM  Between 7am to 6pm - Pager - [phone] After 6pm go to [email or web address] - password [email or web address] [facility]  Office  [phone]  CC: Primary care physician; RICE, Alinda Deem, PA-C

## 2015-02-10 NOTE — Progress Notes (Signed)
Nutrition Follow-up  DOCUMENTATION CODES:   Severe malnutrition in context of chronic illness  INTERVENTION:  Meals and snacks: Recommend dysphagia 3 (chopped) diet at this time secondary to pt status with no other diet restrictions at this time Medical Nutrition Supplement Therapy: Recommend ensure enlive TID for added nutrition   NUTRITION DIAGNOSIS:   Malnutrition related to poor appetite, chronic illness as evidenced by severe depletion of body fat, severe depletion of muscle mass.    GOAL:   Patient will meet greater than or equal to 90% of their needs    MONITOR:    (Energy intake, Anthropometrics)  REASON FOR ASSESSMENT:   Consult Assessment of nutrition requirement/status  ASSESSMENT:       Current Nutrition: ate 25% of breakfast per documentation.  Nursing working with pt this pm during rounds.  Lunch tray not touched at this time.  Nursing trying to encourage pt to eat   Gastrointestinal Profile: Last BM: unknown   Scheduled Medications:  . antiseptic oral rinse  7 mL Mouth Rinse BID  . aspirin  81 mg Oral Daily  . Chlorhexidine Gluconate Cloth  6 each Topical Q0600  . [START ON 02/11/2015] enoxaparin (LOVENOX) injection  40 mg Subcutaneous Q24H  . feeding supplement (ENSURE ENLIVE)  237 mL Oral TID  . levETIRAcetam  500 mg Oral BID  . mupirocin ointment  1 application Nasal BID  . sodium chloride  3 mL Intravenous Q12H       Electrolyte/Renal Profile and Glucose Profile:   Recent Labs Lab 02/07/15 2356 02/08/15 1626 02/10/15 1225  NA 146* 148* 144  K 3.8 3.2* 3.7  CL 110 117* 108  CO2 '30 26 29  '$ BUN '16 11 19  '$ CREATININE 0.47 0.36* 0.43*  CALCIUM 8.8* 8.2* 8.8*  GLUCOSE 97 85 114*   Protein Profile:  Recent Labs Lab 02/07/15 2356 02/08/15 1626  ALBUMIN 3.7 3.0*        Weight Trend since Admission: Filed Weights   02/07/15 2349 02/09/15 0500 02/10/15 0531  Weight: 81 lb 5.6 oz (36.9 kg) 83 lb 12.4 oz (38 kg) 78 lb 3.2 oz  (35.471 kg)       Diet Order:  DIET DYS 3 Room service appropriate?: Yes; Fluid consistency:: Thin  Skin:   reviewed   Height:   Ht Readings from Last 1 Encounters:  02/07/15 '5\' 2"'$  (1.575 m)    Weight:   Wt Readings from Last 1 Encounters:  02/10/15 78 lb 3.2 oz (35.471 kg)    Ideal Body Weight:     BMI:  Body mass index is 14.3 kg/(m^2).  Estimated Nutritional Needs:   Kcal:  BEE 973 kcals (IF 1.0-1.2, AF 1.3) 4782-9562 kcals/d. Using IBW of 50kg  Protein:  (1.0-1.3 g/kg) 50-65 g/d  Fluid:  (30-28m/kg) 1500-17558md  EDUCATION NEEDS:   No education needs identified at this time  MOGleneagleAlZenia ResidesRDOberonLDLinton Hallpager) Weekend/On-Call pager ([phone]

## 2015-02-10 NOTE — Evaluation (Signed)
Physical Therapy Evaluation Patient Details Name: Traci Holmes MRN: [id number] DOB: [birth year] Today's Date: 02/10/2015   History of Present Illness  Pt is admitted for epilepticus and malnutrition. Pt found unresponsive in home with possible seizure. Pt with decreasing troponin and negative MRI.   Clinical Impression  Pt is a pleasantly confused cachetic 69 year old female who was admitted for seizure. Pt performs bed mobility with max assist and is unable to maintain seated balance without total assistance with heavy L side leaning. Noticeable strength deficits L vs R side. Pt's cognition is questionable and she is not the best historian, unsure of prior independence at home. Pt is not at her baseline level at this time. Pt demonstrates deficits with strength/balance/endurance. Would benefit from skilled PT to address above deficits and promote optimal return to PLOF       Follow Up Recommendations SNF    Equipment Recommendations  Rolling walker with 5" wheels    Recommendations for Other Services       Precautions / Restrictions Precautions Precautions: Fall Restrictions Weight Bearing Restrictions: No      Mobility  Bed Mobility Overal bed mobility: Needs Assistance Bed Mobility: Supine to Sit     Supine to sit: Max assist     General bed mobility comments: bed mobility performed with max assist for scooting towards EOB. Pt needs assistance with trunk support and is unable to maintain seated balance at EOB. +2 assist for sit->supine and for scooting towards HOB.  Transfers                 General transfer comment: unsafe at this time secondary to poor seated balance. Pt unable to maintain upright posture  Ambulation/Gait                Stairs            Wheelchair Mobility    Modified Rankin (Stroke Patients Only)       Balance Overall balance assessment: Needs assistance;History of Falls Sitting-balance support: Bilateral upper  extremity supported;Feet supported Sitting balance-Leahy Scale: Zero                                       Pertinent Vitals/Pain Pain Assessment: No/denies pain    Home Living Family/patient expects to be discharged to:: Private residence Living Arrangements: Alone Available Help at Discharge: Family Type of Home: House Home Access: Stairs to enter Entrance Stairs-Rails: Right Entrance Stairs-Number of Steps: "multiple steps" Home Layout: One level Home Equipment: None      Prior Function Level of Independence: Independent         Comments: Pt is poor historian, unsure of accuracy     Hand Dominance        Extremity/Trunk Assessment   Upper Extremity Assessment: Generalized weakness (grossly 4/5)           Lower Extremity Assessment: Generalized weakness (R LE grossly 3/5; L LE grossly 2/5)         Communication   Communication: No difficulties  Cognition Arousal/Alertness: Lethargic Behavior During Therapy: WFL for tasks assessed/performed Overall Cognitive Status: Difficult to assess                      General Comments      Exercises Other Exercises Other Exercises: Pt performed supine ther-ex including B ankle pumps, SLRs, hip abd/add, and heel slides x  10 reps with mod assist on L LE and supervision on R LE. Pt slightly impulsive and needs cues for correct technique of exercise.      Assessment/Plan    PT Assessment Patient needs continued PT services  PT Diagnosis Generalized weakness;Difficulty walking   PT Problem List Decreased strength;Decreased activity tolerance;Decreased mobility  PT Treatment Interventions Gait training;Therapeutic exercise   PT Goals (Current goals can be found in the Care Plan section) Acute Rehab PT Goals Patient Stated Goal: unable to state goals PT Goal Formulation: Patient unable to participate in goal setting Time For Goal Achievement: 02/24/15 Potential to Achieve Goals: Fair     Frequency Min 2X/week   Barriers to discharge Decreased caregiver support      Co-evaluation               End of Session Equipment Utilized During Treatment: Oxygen Activity Tolerance: Patient limited by fatigue Patient left: in bed;with bed alarm set Nurse Communication: Mobility status         Time: 1345-1416 PT Time Calculation (min) (ACUTE ONLY): 31 min   Charges:   PT Evaluation $Initial PT Evaluation Tier I: 1 Procedure PT Treatments $Therapeutic Exercise: 8-22 mins   PT G Codes:        Pasqualino Witherspoon 03-12-15, 3:04 PM  Greggory Stallion, PT, DPT [phone]

## 2015-02-10 NOTE — Progress Notes (Signed)
*  PRELIMINARY RESULTS* Echocardiogram 2D Echocardiogram has been performed.  Traci Holmes 02/10/2015, 1:43 PM

## 2015-02-10 NOTE — Clinical Social Work Note (Signed)
Clinical Social Work Assessment  Patient Details  Name: Traci Holmes MRN: [id number] Date of Birth: 15-Jan-1946  Date of referral:  02/10/15               Reason for consult:  Facility Placement                Permission sought to share information with:  Chartered certified accountant granted to share information::  Yes, Verbal Permission Granted  Name::      [place]::   [place]   Relationship::     Contact Information:     Housing/Transportation Living arrangements for the past 2 months:  [place] of Information:  Patient Patient Interpreter Needed:  None Criminal Activity/Legal Involvement Pertinent to Current Situation/Hospitalization:  No - Comment as needed Significant Relationships:  Adult Children, Neighbor Lives with:  Self Do you feel safe going back to the place where you live?  Yes Need for family participation in patient care:  Yes (Comment)  Care giving concerns:  Patient lives alone in [place].    Social Worker assessment / plan:  Holiday representative (CSW) received consult for possible SNF placement. PT is pending. CSW met with patient to discuss D/C plan. Patient appeared lethargic and her eyes were closed during assessment. CSW introduced self and explained role of CSW department. Patient was oriented to self and place. When asked today's date patient stated that it was a Friday and the month was December. Patient could not state the year. Patient reported that she lives in [place] and gave her address on [address]. Per patient she lives alone and her son [name] provides support. Patient reported that her son lives in [place]. CSW asked how often son comes by and patient stated whenever she needed him to. Patient reported that she drives and gets her own groceries. CSW made patient aware that PT will work with her and make a recommendation of SNF or home health. Patient reported that she prefers  to go home however is agreeable to SNF search. SNF list was provided. Patient did give CSW permission to call her son [name]. CSW left [name] a [employer].   FL2 complete and faxed out.    Employment status:  Disabled (Comment on whether or not currently receiving Disability), Retired Forensic scientist:  Medicare PT Recommendations:  Not assessed at this time Information / Referral to community resources:  [place]  Patient/Family's Response to care:  Patient is agreeable to SNF search however prefers to go home.   Patient/Family's Understanding of and Emotional Response to Diagnosis, Current Treatment, and Prognosis: Patient appeared to be sleepy and kept her eyes closed during assessment.  Emotional Assessment Appearance:  Appears older than stated age Attitude/Demeanor/Rapport:  Lethargic Affect (typically observed):  Apprehensive, Guarded Orientation:  Oriented to Self, Oriented to Place, Fluctuating Orientation (Suspected and/or reported Sundowners) Alcohol / Substance use:  Not Applicable Psych involvement (Current and /or in the community):  No (Comment)  Discharge Needs  Concerns to be addressed:  Discharge Planning Concerns Readmission within the last 30 days:  No Current discharge risk:  Chronically ill, Dependent with Mobility Barriers to Discharge:  Continued Medical Work up   Loralyn Freshwater, LCSW 02/10/2015, 11:55 AM

## 2015-02-10 NOTE — Consult Note (Signed)
Cardiology Consultation Note  Patient ID: Traci Holmes, MRN: [id number], DOB/AGE: July 24, 1945 69 y.o. Admit date: 02/07/2015   Date of Consult: 02/10/2015 Primary Physician: Tilman Neat, PA-C Primary Cardiologist: New to [facility]  Chief Complaint: AMS/seizure  Reason for Consult: Bradycardia with HR into the 40s on tele  HPI: 69 y.o. female with h/o dementia, uterine CA, malnourishment with BMI of 14, and depression who presented to [facility] with AMS and seizure-like symptoms and was found have a heart rate in the 40's at rest. Cardiology was consulted for further evaluation.   She has no previously known cardiac history and denies taking any medications at home as they were all found in the trash can. She was found unresponsive and in somewhat appearing seizure-like behavior on 12/18. Apparently the patient had called a family member to complain about her legs tingling leading to them coming out to check on her. When they got to her house she would not come to the door. They called EMS who gained entry through the window. Their assessment of the seen was the patient on the floor "shaking" in  Seizure-like behavior. She was given Ativen and taken to [facility]. She was loaded with Keppra. Since her arrival her heart rate has ranged in the 60's to 40's in sinus rhythm. She has been asymptomatic. Labs showed a troponin of 0.83-->0.47-->0.36, K+ 3.8-->3.2, pCO2 of 61. CT head showed no acute intracranial abnormalities. MR brain was degraded 2/2 motion without acute intracranial abnormalities. CXR with emphysema, though no acute process. ECG showed NSR, 62 bpm, biatrial enlargement, old anteroseptal infarct.     Past Medical History  Diagnosis Date  . Major neurocognitive disorder, unspecified 08/25/2014  . Dementia   . Alzheimer disease       Most Recent Cardiac Studies: none   Surgical History: No past surgical history on file.   Home Meds: Prior to Admission medications   Not on File    Inpatient  Medications:  . antiseptic oral rinse  7 mL Mouth Rinse BID  . aspirin  81 mg Oral Daily  . Chlorhexidine Gluconate Cloth  6 each Topical Q0600  . enoxaparin (LOVENOX) injection  30 mg Subcutaneous Q24H  . levETIRAcetam  500 mg Oral BID  . mupirocin ointment  1 application Nasal BID  . sodium chloride  3 mL Intravenous Q12H      Allergies:  Allergies  Allergen Reactions  . Penicillins Other (See Comments)    Reaction: unknown  Patient cannot answer follow-up questions    Social History   Social History  . Marital Status: Married    Spouse Name: N/A  . Number of Children: N/A  . Years of Education: N/A   Occupational History  . Not on file.   Social History Main Topics  . Smoking status: Current Every Day Smoker -- 1.00 packs/day    Types: Cigarettes  . Smokeless tobacco: Not on file  . Alcohol Use: No  . Drug Use: No  . Sexual Activity: Not on file   Other Topics Concern  . Not on file   Social History Narrative     No family history on file.  -Patient is uncertain of family history  Review of Systems: Review of Systems  Unable to perform ROS: dementia    Labs:  Recent Labs  02/07/15 2356 02/08/15 0437 02/08/15 1029 02/08/15 1626  TROPONINI 0.14* 0.83* 0.47* 0.36*   Lab Results  Component Value Date   WBC 5.1 02/09/2015   HGB 13.7  02/09/2015   HCT 42.9 02/09/2015   MCV 103.3* 02/09/2015   PLT 162 02/09/2015    Recent Labs Lab 02/08/15 1626  NA 148*  K 3.2*  CL 117*  CO2 26  BUN 11  CREATININE 0.36*  CALCIUM 8.2*  PROT 5.5*  BILITOT 0.3  ALKPHOS 40  ALT 12*  AST 25  GLUCOSE 85   Lab Results  Component Value Date   CHOL 203* 09/06/2012   HDL 71* 09/06/2012   LDLCALC 119* 09/06/2012   TRIG 67 09/06/2012   No results found for: DDIMER  Radiology/Studies:  Ct Head Wo Contrast  02/08/2015  CLINICAL DATA:  Altered mental status and seizure. History of major neuro cognitive disorder. EXAM: CT HEAD WITHOUT CONTRAST TECHNIQUE:  Contiguous axial images were obtained from the base of the skull through the vertex without intravenous contrast. COMPARISON:  08/25/2014 FINDINGS: Diffuse cerebral atrophy. Mild ventricular dilatation consistent with central atrophy. Patchy low-attenuation changes throughout the deep white matter most likely to represent small vessel ischemic change. No mass effect or midline shift. No abnormal extra-axial fluid collections. Gray-white matter junctions are distinct. Basal cisterns are not effaced. No evidence of acute intracranial hemorrhage. No depressed skull fractures. Visualized paranasal sinuses and mastoid air cells are not opacified. Vascular calcifications. IMPRESSION: No acute intracranial abnormalities. Chronic atrophy and small vessel ischemic changes. Electronically Signed   By: Lucienne Capers M.D.   On: 02/08/2015 00:30   Mr Jeri Cos NO Contrast  02/09/2015  CLINICAL DATA:  Seizure EXAM: MRI HEAD WITHOUT AND WITH CONTRAST TECHNIQUE: Multiplanar, multiecho pulse sequences of the brain and surrounding structures were obtained without and with intravenous contrast. CONTRAST:  37m MULTIHANCE GADOBENATE DIMEGLUMINE 529 MG/ML IV SOLN COMPARISON:  CT head 02/08/2015 FINDINGS: Image quality degraded by moderate motion. All sequences degraded by motion. Mild atrophy.  Negative for hydrocephalus. Negative for acute infarct. Moderate chronic microvascular ischemic change in the cerebral white matter and pons. Negative for intracranial hemorrhage. Negative for mass or edema.  No shift of the midline structures. Postcontrast imaging reveals normal enhancement. No enhancing mass lesion. Vascular enhancement is normal. Paranasal sinuses clear. Normal orbit bilaterally. Pituitary not enlarged. IMPRESSION: Image quality degraded by moderate motion Chronic microvascular ischemic change. No acute intracranial abnormality. Electronically Signed   By: CFranchot GalloM.D.   On: 02/09/2015 12:19   Dg Chest Portable 1  View  02/08/2015  CLINICAL DATA:  Altered mental status and seizures. History of dementia, Alzheimer's, and seizures. EXAM: PORTABLE CHEST 1 VIEW COMPARISON:  08/25/2014 FINDINGS: Cardiac enlargement with mild vascular congestion. No edema or consolidation. Diffuse emphysematous changes in the lungs with central interstitial process likely representing chronic bronchitis. No focal airspace disease or consolidation. No blunting of costophrenic angles. No pneumothorax. Calcified and tortuous aorta. Degenerative changes in the spine. IMPRESSION: Cardiac enlargement with mild vascular congestion. Emphysematous changes and chronic bronchitic changes in the lungs. No evidence of active pulmonary disease. Electronically Signed   By: WLucienne CapersM.D.   On: 02/08/2015 00:34    EKG: NSR, 62 bpm, biatrial enlargement, old anteroseptal infarct  Weights: Filed Weights   02/07/15 2349 02/09/15 0500 02/10/15 0531  Weight: 81 lb 5.6 oz (36.9 kg) 83 lb 12.4 oz (38 kg) 78 lb 3.2 oz (35.471 kg)     Physical Exam: Blood pressure 127/47, pulse 62, temperature 98.3 F (36.8 C), temperature source Oral, resp. rate 18, height '5\' 2"'$  (1.575 m), weight 78 lb 3.2 oz (35.471 kg), SpO2 93 %. Body mass index  is 14.3 kg/(m^2). General: Frail appearing, in no acute distress. Head: Normocephalic, atraumatic, sclera non-icteric, no xanthomas, nares are without discharge.  Neck: Negative for carotid bruits. JVD not elevated. Lungs: Clear bilaterally to auscultation without wheezes, rales, or rhonchi. Breathing is unlabored. Heart: Bradycardic with S1 S2. No murmurs, rubs, or gallops appreciated. Abdomen: Soft, non-tender, non-distended with normoactive bowel sounds. No hepatomegaly. No rebound/guarding. No obvious abdominal masses. Msk:  Strength and tone appear normal for age. Extremities: No clubbing or cyanosis. No edema.  Distal pedal pulses are 2+ and equal bilaterally. Neuro: Alert. No facial asymmetry. No focal  deficit. Moves all extremities spontaneously. Psych:  Responds at times, though confused.    Assessment and Plan:   1. Bradycardia: -Heart rate has ranged from a low of 41 to the 60s on telemetry with patient at rest -She has been asymptomatic  -Replete potassium--> no labs since the 18th -Low heart rate possibly in the setting of her malnourishment, could supplement with Boost/Ensure -Echo pending to evaluate LV function and wall motion -Monitor on tele for significant pauses   2. Elevated troponin: -Possibly demand ischemia in the setting of patients seizure-like behavior  -Mildly elevated and downward trending -Echo pending as above  3. Seizure-like behavior: -Neuro on board -Status post Keppra load  4. Dementia: -Playing a significant role in the patient's malnourishment and medication noncompliance  -Would benefit from placement -Per IM   Signed, Christell Faith, PA-C Pager: [phone] 02/10/2015, 11:12 AM

## 2015-02-11 MED ORDER — LORAZEPAM 2 MG/ML IJ SOLN
2.0000 mg | INTRAMUSCULAR | Status: DC | PRN
  Administered 2015-02-12 – 2015-02-13 (×2): 2 mg via INTRAVENOUS
  Filled 2015-02-11 (×2): qty 1

## 2015-02-11 MED ORDER — INFLUENZA VAC SPLIT QUAD 0.5 ML IM SUSY
0.5000 mL | PREFILLED_SYRINGE | INTRAMUSCULAR | Status: AC
  Administered 2015-02-12: 0.5 mL via INTRAMUSCULAR
  Filled 2015-02-11: qty 0.5

## 2015-02-11 MED ORDER — ENOXAPARIN SODIUM 30 MG/0.3ML ~~LOC~~ SOLN
30.0000 mg | SUBCUTANEOUS | Status: DC
  Administered 2015-02-12 – 2015-02-13 (×2): 30 mg via SUBCUTANEOUS
  Filled 2015-02-11 (×3): qty 0.3

## 2015-02-11 MED ORDER — SODIUM CHLORIDE 0.9 % IJ SOLN: 3.0000 mL | INTRAMUSCULAR | Status: DC | PRN

## 2015-02-11 NOTE — Progress Notes (Signed)
Pharmacy Consult for Lovenox Indication: VTE prophylaxis  Allergies  Allergen Reactions  . Penicillins Other (See Comments)    Reaction: unknown  Patient cannot answer follow-up questions    Patient Measurements: Height: '5\' 2"'$  (157.5 cm) Weight: 75 lb 14.4 oz (34.428 kg) IBW/kg (Calculated) : 50.1  Vital Signs:  Temp: 98 F (36.7 C) (12/21 1125) Temp Source: Oral (12/21 1125) BP: 112/45 mmHg (12/21 1125) Pulse Rate: 55 (12/21 1125)  Labs:  Recent Labs  02/08/15 1626 02/09/15 0447 02/10/15 1225  HGB  --  13.7  --   HCT  --  42.9  --   PLT  --  162  --   CREATININE 0.36*  --  0.43*  TROPONINI 0.36*  --   --     Estimated Creatinine Clearance: 35.5 mL/min (by C-G formula based on Cr of 0.43).  Assessment: 69 y/o F admitted with new-onset seizures previously on heparin for dvt prophylaxis to change to LMWH. Patient weight is 34.4 kg.   Current orders for enoxaparin 40 mg SQ q24h.   Plan:  Will begin Lovenox 30 mg daily at 2200 for wt <45 kg. Will continue to follow anticoagulation monitoring.   Rayna Sexton L 02/11/2015,1:27 PM

## 2015-02-11 NOTE — Progress Notes (Addendum)
[place] [facility] [place] NAME: Traci Holmes    MR#:  [id number]  DATE OF BIRTH:  01-31-1946  SUBJECTIVE:  CHIEF COMPLAINT:  Patient is more awake and moaning has dementia at baseline. Answering few questions No family members at bedside,son is refusing to take her back home as she is not manageable at home. Patient is not eating food at home and stocking her food  REVIEW OF SYSTEMS:   Review of systems unobtainable today, patient is moaning DRUG ALLERGIES:   Allergies  Allergen Reactions  . Penicillins Other (See Comments)    Reaction: unknown  Patient cannot answer follow-up questions    VITALS:  Blood pressure 112/45, pulse 55, temperature 98 F (36.7 C), temperature source Oral, resp. rate 19, height '5\' 2"'$  (1.575 m), weight 34.428 kg (75 lb 14.4 oz), SpO2 94 %.  PHYSICAL EXAMINATION:  GENERAL:  69 y.o.-year-old patient lying in the bed with no acute distress.  EYES: Pupils equal, round, reactive to light and accommodation. No scleral icterus.  HEENT: Head atraumatic, normocephalic. Oropharynx and nasopharynx clear. Some bruises are noticed on the face NECK:  Supple, no jugular venous distention. No thyroid enlargement, no tenderness.  LUNGS: Moderate breath sounds bilaterally, no wheezing, rales,rhonchi or crepitation. No use of accessory muscles of respiration.  CARDIOVASCULAR: S1, S2 normal. No murmurs, rubs, or gallops.  ABDOMEN: Soft, nontender, nondistended. Bowel sounds present. No organomegaly or mass.  EXTREMITIES: No pedal edema, cyanosis, or clubbing.  NEUROLOGIC: Altered mental status. Gait not checked.  PSYCHIATRIC: The patient is disoriented SKIN: No obvious rash, lesion, or ulcer.    LABORATORY PANEL:   CBC  Recent Labs Lab 02/09/15 0447  WBC 5.1  HGB 13.7  HCT 42.9  PLT 162   ------------------------------------------------------------------------------------------------------------------  Chemistries    Recent Labs Lab 02/08/15 1626 02/10/15 1225  NA 148* 144  K 3.2* 3.7  CL 117* 108  CO2 26 29  GLUCOSE 85 114*  BUN 11 19  CREATININE 0.36* 0.43*  CALCIUM 8.2* 8.8*  AST 25  --   ALT 12*  --   ALKPHOS 40  --   BILITOT 0.3  --    ------------------------------------------------------------------------------------------------------------------  Cardiac Enzymes  Recent Labs Lab 02/08/15 1626  TROPONINI 0.36*   ------------------------------------------------------------------------------------------------------------------  RADIOLOGY:  No results found.  EKG:   Orders placed or performed during the hospital encounter of 02/07/15  . ED EKG  . ED EKG  . EKG 12-Lead  . EKG 12-Lead    ASSESSMENT AND PLAN:  This is a 69 year old female admitted for seizure-like activity and elevated troponin. #. Altered mental status probably post ictal with acute respiratory failure with hypercapnia on her baseline Alzheimer's dementia No head bleed on CT scan.   # Malnourishment: With failure to thrive: The patient's BMI is 14.9; Consult palliative care-pending  psychiatry consult is placed regarding Competency evaluation Appreciate dietary recommendations-Will get calorie count The patient's niece who is her primary caregiver reports uneaten food in the refrigerator as well as multifocal lying around the house is in disarray  #Acute respiratory failure with hypercapnia which could lower the seizure threshold -resolved  Continue close monitoring. Appreciate pulmonology recommendations  #New-onset seizures Continue Keppra 500 mg by mouth twice a day Neurology recommended aspirin 81 mg by mouth once daily No driving or operating heavy missionary for the next 6 months Follow-up with [facility] neurology in 3 months Appreciate neurology's recommendations  #. Elevated troponin: No EKG evidence of myocardial infarction. Increase  in troponin may be secondary to prolonged seizure activity.  Troponins are trending down  #. Malnourishment: With failure to thrive: The patient's BMI is 14.9; Consult dietitian The patient's niece who is her primary caregiver reports uneaten food in the refrigerator as well as multifocal lying around the house is in disarray  #. Dementia: Probably from Alzheimer's disease.   #. DVT prophylaxis: Heparin #. GI prophylaxis: ppi  #Generalized weakness, PT has recommended skilled nursing facility.   #disposition: Previous facility is refusing to take the patient back, and son is also refusing to take her home as she is not manageable at home. At this point psychiatry consult is placed for competency evaluation Follow-up with case manager  All the records are reviewed and case discussed with Care Management/Social Workerr. Management plans discussed with the patient, family and they are in agreement.  CODE STATUS: Full code  TOTAL critical care  TIME TAKING CARE OF THIS PATIENT: 35 minutes.   POSSIBLE D/C IN 3-4 DAYS, DEPENDING ON CLINICAL CONDITION.   Nicholes Mango M.D on 02/11/2015 at 5:03 PM  Between 7am to 6pm - Pager - [phone] After 6pm go to [email or web address] - password [email or web address] [facility]  Office  [phone]  CC: Primary care physician; RICE, Alinda Deem, PA-C

## 2015-02-11 NOTE — Progress Notes (Signed)
Spoke with patient's niece Cecille Rubin about patient current status and plan of care.

## 2015-02-11 NOTE — Progress Notes (Signed)
Palliative Care Update  Aware of consult request.  As it is late this evening, I will plan on seeing pt/ family tomorrow morning.  I have reviewed the chart and will follow up tomorrow.    Kirby Funk, MD Palliative Care

## 2015-02-11 NOTE — Care Management (Signed)
Patient is more alert today and per CSW agreeable to skilled nursing placement.  CSW will proceed with assessment for self neglect concerns

## 2015-02-11 NOTE — Progress Notes (Signed)
CSW in to meet with patient to present bed offers.  Patient engaged in conversation.  CSW explained SNF process again.  Patient is willing to go to SNF and has selected [facility].    Concerns of possible self-neglect for patient based on information received from RN care manager when she spoke to patient's son.  CSW will make referral to APS when patient discharges to SNF, and inform [facility] social worker.   CSW will continue to follow patient and assist with ongoing and discharge needs.  Traci Holmes. Traci Holmes, MSW Clinical Social Work Department [phone] 12:42 PM

## 2015-02-11 NOTE — Progress Notes (Signed)
Patient: Traci Holmes / Admit Date: 02/07/2015 / Date of Encounter: 02/11/2015, 1:31 PM   Subjective: Much more alert and lucid today. No complaints. Echo showed EF 55-65%, mild MR. Reports eating "better."  Review of Systems: Review of Systems  Unable to perform ROS: dementia     Objective: Telemetry: NSR, 70's, in the 40's overnight Physical Exam: Blood pressure 112/45, pulse 55, temperature 98 F (36.7 C), temperature source Oral, resp. rate 19, height '5\' 2"'$  (1.575 m), weight 75 lb 14.4 oz (34.428 kg), SpO2 94 %. Body mass index is 13.88 kg/(m^2). General: Frail appearing, in no acute distress. Head: Normocephalic, atraumatic, sclera non-icteric, no xanthomas, nares are without discharge. Neck: Negative for carotid bruits. JVP not elevated. Lungs: Clear bilaterally to auscultation without wheezes, rales, or rhonchi. Breathing is unlabored. Heart: RRR S1 S2 without murmurs, rubs, or gallops.  Abdomen: Soft, non-tender, non-distended with normoactive bowel sounds. No rebound/guarding. Extremities: No clubbing or cyanosis. No edema. Distal pedal pulses are 2+ and equal bilaterally. Neuro: Alert. Moves all extremities spontaneously. Psych:  Responds to questions.   Intake/Output Summary (Last 24 hours) at 02/11/15 1331 Last data filed at 02/11/15 0745  Gross per 24 hour  Intake    120 ml  Output   1175 ml  Net  -1055 ml    Inpatient Medications:  . aspirin  81 mg Oral Daily  . Chlorhexidine Gluconate Cloth  6 each Topical Q0600  . enoxaparin (LOVENOX) injection  30 mg Subcutaneous Q24H  . feeding supplement (ENSURE ENLIVE)  237 mL Oral TID  . [START ON 02/12/2015] Influenza vac split quadrivalent PF  0.5 mL Intramuscular Tomorrow-1000  . levETIRAcetam  500 mg Oral BID  . mupirocin ointment  1 application Nasal BID   Infusions:    Labs:  Recent Labs  02/08/15 1626 02/10/15 1225  NA 148* 144  K 3.2* 3.7  CL 117* 108  CO2 26 29  GLUCOSE 85 114*  BUN 11 19    CREATININE 0.36* 0.43*  CALCIUM 8.2* 8.8*    Recent Labs  02/08/15 1626  AST 25  ALT 12*  ALKPHOS 40  BILITOT 0.3  PROT 5.5*  ALBUMIN 3.0*    Recent Labs  02/09/15 0447  WBC 5.1  HGB 13.7  HCT 42.9  MCV 103.3*  PLT 162    Recent Labs  02/08/15 1626  TROPONINI 0.36*   Invalid input(s): POCBNP No results for input(s): HGBA1C in the last 72 hours.   Weights: Filed Weights   02/09/15 0500 02/10/15 0531 02/11/15 0440  Weight: 83 lb 12.4 oz (38 kg) 78 lb 3.2 oz (35.471 kg) 75 lb 14.4 oz (34.428 kg)     Radiology/Studies:  Ct Head Wo Contrast  02/08/2015  CLINICAL DATA:  Altered mental status and seizure. History of major neuro cognitive disorder. EXAM: CT HEAD WITHOUT CONTRAST TECHNIQUE: Contiguous axial images were obtained from the base of the skull through the vertex without intravenous contrast. COMPARISON:  08/25/2014 FINDINGS: Diffuse cerebral atrophy. Mild ventricular dilatation consistent with central atrophy. Patchy low-attenuation changes throughout the deep white matter most likely to represent small vessel ischemic change. No mass effect or midline shift. No abnormal extra-axial fluid collections. Gray-white matter junctions are distinct. Basal cisterns are not effaced. No evidence of acute intracranial hemorrhage. No depressed skull fractures. Visualized paranasal sinuses and mastoid air cells are not opacified. Vascular calcifications. IMPRESSION: No acute intracranial abnormalities. Chronic atrophy and small vessel ischemic changes. Electronically Signed   By: Gwyndolyn Saxon  Gerilyn Nestle M.D.   On: 02/08/2015 00:30   Mr Jeri Cos LM Contrast  02/09/2015  CLINICAL DATA:  Seizure EXAM: MRI HEAD WITHOUT AND WITH CONTRAST TECHNIQUE: Multiplanar, multiecho pulse sequences of the brain and surrounding structures were obtained without and with intravenous contrast. CONTRAST:  37m MULTIHANCE GADOBENATE DIMEGLUMINE 529 MG/ML IV SOLN COMPARISON:  CT head 02/08/2015 FINDINGS: Image  quality degraded by moderate motion. All sequences degraded by motion. Mild atrophy.  Negative for hydrocephalus. Negative for acute infarct. Moderate chronic microvascular ischemic change in the cerebral white matter and pons. Negative for intracranial hemorrhage. Negative for mass or edema.  No shift of the midline structures. Postcontrast imaging reveals normal enhancement. No enhancing mass lesion. Vascular enhancement is normal. Paranasal sinuses clear. Normal orbit bilaterally. Pituitary not enlarged. IMPRESSION: Image quality degraded by moderate motion Chronic microvascular ischemic change. No acute intracranial abnormality. Electronically Signed   By: CFranchot GalloM.D.   On: 02/09/2015 12:19   Dg Chest Portable 1 View  02/08/2015  CLINICAL DATA:  Altered mental status and seizures. History of dementia, Alzheimer's, and seizures. EXAM: PORTABLE CHEST 1 VIEW COMPARISON:  08/25/2014 FINDINGS: Cardiac enlargement with mild vascular congestion. No edema or consolidation. Diffuse emphysematous changes in the lungs with central interstitial process likely representing chronic bronchitis. No focal airspace disease or consolidation. No blunting of costophrenic angles. No pneumothorax. Calcified and tortuous aorta. Degenerative changes in the spine. IMPRESSION: Cardiac enlargement with mild vascular congestion. Emphysematous changes and chronic bronchitic changes in the lungs. No evidence of active pulmonary disease. Electronically Signed   By: WLucienne CapersM.D.   On: 02/08/2015 00:34     Assessment and Plan   1. Bradycardia: -Demonstrates appropriate chronotropic response with heart rate into the 70s with movement this afternoon -She has been asymptomatic  -Low heart rate possibly in the setting of her malnourishment, could supplement with Boost/Ensure -Echo essentially normal -Monitor on tele for significant pauses   2. Elevated troponin: -Possibly demand ischemia in the setting of patients  seizure-like behavior  -Mildly elevated and downward trending -Echo as above -No plans for ischemic work up at this time per consulting cardiologist   3. Seizure-like behavior: -Neuro on board -Status post Keppra load  4. Dementia: -More alert and lucid today, heart rate is also improved  -Playing a significant role in the patient's malnourishment and medication noncompliance  -Would benefit from placement -Per IM  Signed, RChristell Faith PA-C Pager: [phone]/21/2016, 1:31 PM

## 2015-02-12 DIAGNOSIS — G40909 Epilepsy, unspecified, not intractable, without status epilepticus: Secondary | ICD-10-CM | POA: Diagnosis not present

## 2015-02-12 MED ORDER — PNEUMOCOCCAL VAC POLYVALENT 25 MCG/0.5ML IJ INJ
0.5000 mL | INJECTION | INTRAMUSCULAR | Status: AC
  Administered 2015-02-13: 0.5 mL via INTRAMUSCULAR
  Filled 2015-02-12: qty 0.5

## 2015-02-12 MED ORDER — MEGESTROL ACETATE 400 MG/10ML PO SUSP
400.0000 mg | Freq: Every day | ORAL | Status: DC
  Administered 2015-02-12 – 2015-02-14 (×3): 400 mg via ORAL
  Filled 2015-02-12 (×3): qty 10

## 2015-02-12 NOTE — Progress Notes (Signed)
Traci Holmes    MR#:  [id number]  DATE OF BIRTH:  09-23-1945  SUBJECTIVE:  CHIEF COMPLAINT:   Patient confused unable to give any review of systems but seems to be more alert than before.  REVIEW OF SYSTEMS:   Review of systems unobtainable today, patient is moaning DRUG ALLERGIES:   Allergies  Allergen Reactions  . Penicillins Other (See Comments)    Reaction: unknown  Patient cannot answer follow-up questions    VITALS:  Blood pressure 110/42, pulse 55, temperature 98.6 F (37 C), temperature source Oral, resp. rate 18, height '5\' 2"'$  (1.575 m), weight 35.517 kg (78 lb 4.8 oz), SpO2 92 %.  PHYSICAL EXAMINATION:  GENERAL:  69 y.o.-year-old patient lying in the bed with no acute distress.  EYES: Pupils equal, round, reactive to light and accommodation. No scleral icterus.  HEENT: Head atraumatic, normocephalic. Oropharynx and nasopharynx clear. Some bruises are noticed on the face NECK:  Supple, no jugular venous distention. No thyroid enlargement, no tenderness.  LUNGS: Moderate breath sounds bilaterally, no wheezing, rales,rhonchi or crepitation. No use of accessory muscles of respiration.  CARDIOVASCULAR: S1, S2 normal. No murmurs, rubs, or gallops.  ABDOMEN: Soft, nontender, nondistended. Bowel sounds present. No organomegaly or mass.  EXTREMITIES: No pedal edema, cyanosis, or clubbing.  NEUROLOGIC: Altered mental status. G gait not checked PSYCHIATRIC: The patient is disorienteait  SKIN: No obvious rash, lesion, or ulcer.    LABORATORY PANEL:   CBC  Recent Labs Lab 02/09/15 0447  WBC 5.1  HGB 13.7  HCT 42.9  PLT 162   ------------------------------------------------------------------------------------------------------------------  Chemistries   Recent Labs Lab 02/08/15 1626 02/10/15 1225  NA 148* 144  K 3.2* 3.7  CL 117* 108  CO2 26 29  GLUCOSE 85 114*  BUN 11 19   CREATININE 0.36* 0.43*  CALCIUM 8.2* 8.8*  AST 25  --   ALT 12*  --   ALKPHOS 40  --   BILITOT 0.3  --    ------------------------------------------------------------------------------------------------------------------  Cardiac Enzymes  Recent Labs Lab 02/08/15 1626  TROPONINI 0.36*   ------------------------------------------------------------------------------------------------------------------  RADIOLOGY:  No results found.  EKG:   Orders placed or performed during the hospital encounter of 02/07/15  . ED EKG  . ED EKG  . EKG 12-Lead  . EKG 12-Lead    ASSESSMENT AND PLAN:  This is a 69 year old female admitted for seizure-like activity and elevated troponin. #. Altered mental status probably post ictal with acute respiratory failure with hypercapnia on her baseline Alzheimer's dementia  MRI is negative for acute CVA   # Malnourishment: With failure to thrive: The patient's BMI is 14.9; Consult palliative care-pending Patient started on Megace   #Acute respiratory failure with hypercapnia which could lower the seizure threshold -resolved  Continue close monitoring. Appreciate pulmonology recommendations  #New-onset seizures Continue Keppra 500 mg by mouth twice a day Follow-up with [facility] neurology in 3 months Appreciate neurology's recommendations  #. Elevated troponin: No EKG evidence of myocardial infarction. Increase in troponin may be secondary to prolonged seizure activity. Troponins are trending down  #. Malnourishment: With failure to thrive: The patient's BMI is 14.9; Consult dietitian The patient's niece who is her primary caregiver reports uneaten food in the refrigerator as well as multifocal lying around the house is in disarray  #. Dementia: Probably from Alzheimer's disease.   #. DVT prophylaxis: Heparin #. GI prophylaxis: ppi  #Generalized weakness, PT has recommended skilled nursing  facility.   #disposition: Skilled nursing  facility  All the records are reviewed and case discussed with Care Management/Social Workerr. Management plans discussed with the patient, family and they are in agreement.  CODE STATUS: Full code  TOTAL critical care  TIME TAKING CARE OF THIS PATIENT: 35 minutes.      Dustin Flock M.D on 02/12/2015 at 2:24 PM  Between 7am to 6pm - Pager - [phone] After 6pm go to [email or web address] - password [email or web address] [facility]  Office  [phone]  CC: Primary care physician; RICE, Alinda Deem, PA-C

## 2015-02-12 NOTE — Progress Notes (Signed)
CSW received a call from [place] at [facility] and was informed that patient will not be at immediate risk at discharge. He encouraged CSW to contact [facility] and inform them to contact APS if patient's plan is to discharge home. Mikki Santee reports that he APS report for patient will be screened out at this time.  CSW will continue to follow and assist as needed.   Ernest Pine, MSW, [place] Social Work Department [phone]

## 2015-02-12 NOTE — Care Management (Signed)
Patient's son Traci Holmes on the unit.  Paged palliative care to alert family here.  He also wished to speak with attending and attending informed.  Attending was to call into the room.  Son could not stay.  Informed him that palliative care team currently involved with another assessment.  Placed note for physicians to contact son per phone.  Number in [place] is a working number

## 2015-02-12 NOTE — Progress Notes (Signed)
Physical Therapy Treatment Patient Details Name: Traci Holmes MRN: [id number] DOB: December 29, 1945 Today's Date: Feb 20, 2015    History of Present Illness Pt is admitted for epilepticus and malnutrition. Pt found unresponsive in home with possible seizure. Pt with decreasing troponin and negative MRI.     PT Comments    Pt is making limited progress towards goals secondary to decreased cognition status. Pt very lethargic and slumped over in bed, needs assist for correction of posture x 2 with max assist. Pt arousalble verbally, however quickly falls back asleep. Limited participation noted in there-ex, noticed there-ex easier to perform on R LE vs L LE. Not safe for OOB attempts at this time.  Follow Up Recommendations  SNF     Equipment Recommendations  Rolling walker with 5" wheels    Recommendations for Other Services       Precautions / Restrictions Precautions Precautions: Fall Restrictions Weight Bearing Restrictions: No    Mobility  Bed Mobility               General bed mobility comments: unsafe to perform bed mobility as pt could barely maintain arousal level for ther-ex. Repeated verabl/tactile cues required to maintain arousal level  Transfers                    Ambulation/Gait                 Stairs            Wheelchair Mobility    Modified Rankin (Stroke Patients Only)       Balance                                    Cognition Arousal/Alertness: Lethargic Behavior During Therapy: Flat affect Overall Cognitive Status: Difficult to assess                      Exercises Other Exercises Other Exercises: supine ther-ex performed including B LE SLRs, hip abd/add, and knee flexion. Pt unable to understand concept of quad sets/glut sets despite education. Exercises performed x 10 reps with mod assist    General Comments        Pertinent Vitals/Pain Pain Assessment: No/denies pain    Home Living                       Prior Function            PT Goals (current goals can now be found in the care plan section) Acute Rehab PT Goals Patient Stated Goal: unable to state goals PT Goal Formulation: Patient unable to participate in goal setting Time For Goal Achievement: 02/24/15 Potential to Achieve Goals: Fair Progress towards PT goals: Progressing toward goals    Frequency  Min 2X/week    PT Plan Current plan remains appropriate    Co-evaluation             End of Session Equipment Utilized During Treatment: Oxygen Activity Tolerance: Patient limited by fatigue Patient left: in bed;with bed alarm set     Time: 1345-1357 PT Time Calculation (min) (ACUTE ONLY): 12 min  Charges:  $Therapeutic Exercise: 8-22 mins                    G Codes:      Thandiwe Siragusa 02/20/2015, 3:19 PM Greggory Stallion, PT, DPT [phone]

## 2015-02-12 NOTE — Progress Notes (Addendum)
Per RN Progression Meeting patient most likely will not be ready for discharge today. CSW informed Helene Kelp at [facility] of patient's status. CSW will continue to follow and assist as needed.   Ernest Pine, MSW, [place] Social Work Department [phone]

## 2015-02-12 NOTE — Consult Note (Addendum)
Palliative Medicine Inpatient Consult Note   Name: Traci Holmes Date: 02/12/2015 MRN: [id number]  DOB: Jun 07, 1945  Referring Physician: Dustin Flock, MD  Palliative Care consult requested for this 69 y.o. female for goals of medical therapy in patient with seizure like activity and dementia.  Pt was in her home and called her niece for help because her legs felt numb.  She was apparently having seizure activity when first responders had to break in.  She was given Ativan '4mg'$  and brought to ER.  She was difficult to arouse but has been waking up.  She is known to have Alzheimer's dementia and she is a smoker of 1 ppd of cigarettes.  Pt has known h/o 'shaking sometimes' but this shaking was different. She was noted to have an elevated troponin (not felt due to MI).  Keppra was started.  She was noted to have hypercapneic resp flre with high CO2 that could have lowered seizure threshold.  She has required BIPAP.  She is very malnourished with a BMI of 15.  She has had some sinus bradycardia. Echo showed nl EF.  No indication for pacer per cardiology (Dr. Rockey Holmes).  PT rec: SNF for skiled PT    TODAY'S DISCUSSIONS AND DECISIONS:  I examined and spoke briefly with the patient.  I did not ask her about code status or palliative issues as I do not have any clinical information that informs me as to the degree of her dementia.  And, it being evening, is not a good time of day to interview dementia patients.  I called the patient's son, Traci Holmes.  He was not familiar with 'code status' so I educated him.  He at first became concerned that I was hinting that his mother was going to die right away. After I re-assured him that this was not in any way the case, he was able to hear the information better.  After I was sure he had a good understanding of this subject, I asked him for his opinion on what her code status should be. He said he wanted to ask his mother. He said that she would be able to make her own  decision on this matter.  He then also said, "But she is not able to live alone ever again."  He told me HIS main concern is that she not live on her own ever again.  She can't live alone because she doesn't always make good decisions for herself.  So there was mixed information in this conversation. Son feels she can determine her own code status but cannot ever live alone again. Without the patient being deemed incompetent, it is probably going to fall to family to make sure she doesn't live on her own in the future. ( Though DSS should be called when it is time for her rehab to be over.  A side note is that I confirmed that the pt survives on her Social Security check alone and I encouraged the son to apply on her behalf for Medicaid (just in case she might qualify). He did not seem to 'pick up on this concept' ---so others need to follow up on this in the facility.  He is apparently not POA and is not involved in managing her affairs.  But someone is going to have to take on that role.   She certainly does not meet Hospice criteria under the category of dementia.  But, she would under the category related to malnutrition.  If her nutrition  does not improve in the SNF, then she might benefit from a Hospice Admission at some point in the future.  For this reason, I strongly recommend a Palliative Care Consult in the facility and have conveyed this to the Care Mgr.    I will sign off at this time.   Please list the need for a Palliative Care consult in the discharge summary.     IMPRESSION: 1. Seizure---possible not definite ---possibly caused by a lowered threshold for seizure activity due to patient having hypercapnea 2.  Acute respiratory failure with hypercapnea ---due to COPD ---due to smoking in room without ventilation (info from son) 3.  Alzheimer's Dementia ---moderate stage (? Most likely) 4.  Metabolic Encephalopathy at admission 5.  Dysesthesias 6.  Tremors 7.  Elevated troponin   ---felt to be secondary to seizure 8.  Ongoing smoking of cigarettes 9.  Severe malnutrition ----likely related to COPD and dementia 10.  Gait disorder with falls      REVIEW OF SYSTEMS:  Patient is not able to provide ROS due to dementia  SPIRITUAL SUPPORT SYSTEM: Yes --family.  SOCIAL HISTORY:  reports that she has been smoking Cigarettes.  She has been smoking about 1.00 pack per day. She does not have any smokeless tobacco history on file. She reports that she does not drink alcohol or use illicit drugs. She was apparently living at home alone.  Has a son, Traci Holmes [phone]), who lives in [place], [place].  Here is copy of Care Mgr's note from 12/20: Attempted to speak with patient and unable to awaken her. She moans to acknowledge when her name is called. While CM in the room, patient's phone rang and it was patient's son Traci Holmes (listed as Traci Holmes in Epic.) Confirmed his phone number [phone]. Informed him that CM and CSW have left messages that have not been returned. he confirms that patient has dementia. Says that two years ago patient and Sterling's brother were sent to [facility] but patient got "kicked out" because she became "wild". Traci Holmes says that he has called APS in [facility] to help with his mother but was told they could not do anything because the patient was not hurting anybody and not hurting herself. Discussed self neglect. Have paged attending to discuss need for competency/capacity assessment. This history began sounding familiar to CM and found documentation in [employer] substantiating that patient and her son were admitted to [facility], involvement of [place] DSS/APS and both were discharged to [facility]. There is documentation 01/28/2014 that [place] had reported patient was deemed competent by the courts and she went home and "worked very hard to get her house cleaned up." Traci Holmes says patient is  getting more and more confused. He is adamant that patient can not return home alone. The house is filthy, patient is not eating, bathing. All she is worried about are her tow dogs and cats. Updated CSW. There may be need for APS to reassess   LEGAL DOCUMENTS:  none  CODE STATUS: Full code  PAST MEDICAL HISTORY: Past Medical History  Diagnosis Date  . Major neurocognitive disorder, unspecified 08/25/2014  . Dementia   . Alzheimer disease     PAST SURGICAL HISTORY: History reviewed. No pertinent past surgical history.  ALLERGIES:  is allergic to penicillins.  MEDICATIONS:  Current Facility-Administered Medications  Medication Dose Route Frequency Provider Last Rate Last Dose  . acetaminophen (TYLENOL) tablet 650 mg  650 mg Oral  Q6H PRN Harrie Foreman, MD   650 mg at 02/11/15 7408   Or  . acetaminophen (TYLENOL) suppository 650 mg  650 mg Rectal Q6H PRN Harrie Foreman, MD      . albuterol (PROVENTIL) (2.5 MG/3ML) 0.083% nebulizer solution 2.5 mg  2.5 mg Nebulization Q4H PRN Wilhelmina Mcardle, MD      . aspirin chewable tablet 81 mg  81 mg Oral Daily Nicholes Mango, MD   81 mg at 02/12/15 1032  . Chlorhexidine Gluconate Cloth 2 % PADS 6 each  6 each Topical Q0600 Harrie Foreman, MD   6 each at 02/12/15 [phone]  . enoxaparin (LOVENOX) injection 30 mg  30 mg Subcutaneous Q24H Wilhelmina Mcardle, MD   30 mg at 02/11/15 2303  . feeding supplement (ENSURE ENLIVE) (ENSURE ENLIVE) liquid 237 mL  237 mL Oral TID Nicholes Mango, MD   237 mL at 02/12/15 1513  . Influenza vac split quadrivalent PF (FLUARIX) injection 0.5 mL  0.5 mL Intramuscular Tomorrow-1000 Aruna Gouru, MD      . levETIRAcetam (KEPPRA) tablet 500 mg  500 mg Oral BID Nicholes Mango, MD   500 mg at 02/12/15 1032  . LORazepam (ATIVAN) injection 2 mg  2 mg Intravenous Q4H PRN Nicholes Mango, MD   2 mg at 02/12/15 0045  . megestrol (MEGACE) 400 MG/10ML suspension 400 mg  400 mg Oral Daily Traci Flock, MD   400 mg at 02/12/15 1033  .  mupirocin ointment (BACTROBAN) 2 % 1 application  1 application Nasal BID Harrie Foreman, MD   1 application at 18/56/31 1203  . ondansetron (ZOFRAN) tablet 4 mg  4 mg Oral Q6H PRN Harrie Foreman, MD       Or  . ondansetron [facility]) injection 4 mg  4 mg Intravenous Q6H PRN Harrie Foreman, MD      . Derrill Memo ON 02/13/2015] pneumococcal 23 valent vaccine (PNU-IMMUNE) injection 0.5 mL  0.5 mL Intramuscular Tomorrow-1000 Traci Flock, MD      . sodium chloride 0.9 % injection 3 mL  3 mL Intravenous PRN Nicholes Mango, MD        Vital Signs: BP 102/74 mmHg  Pulse 73  Temp(Src) 98.5 F (36.9 C) (Oral)  Resp 18  Ht '5\' 2"'$  (1.575 m)  Wt 35.517 kg (78 lb 4.8 oz)  BMI 14.32 kg/m2  SpO2 95% Filed Weights   02/10/15 0531 02/11/15 0440 02/12/15 0533  Weight: 35.471 kg (78 lb 3.2 oz) 34.428 kg (75 lb 14.4 oz) 35.517 kg (78 lb 4.8 oz)    Estimated body mass index is 14.32 kg/(m^2) as calculated from the following:   Height as of this encounter: '5\' 2"'$  (1.575 m).   Weight as of this encounter: 35.517 kg (78 lb 4.8 oz).  PERFORMANCE STATUS (ECOG) : 3 - Symptomatic, >50% confined to bed  PHYSICAL EXAM: Lying in bed --a bit contorted position in medical floor bed EOMI Speaks in short polite sentences EOMI OP clear NoJVD or TM Hrt rrr no m Lungs cta ant Abd soft and nt Skin no mottling or cyanosis      LABS: CBC:    Component Value Date/Time   WBC 5.1 02/09/2015 0447   WBC 8.6 01/28/2014 1735   HGB 13.7 02/09/2015 0447   HGB 14.3 01/28/2014 1735   HCT 42.9 02/09/2015 0447   HCT 44.0 01/28/2014 1735   PLT 162 02/09/2015 0447   PLT 302 01/28/2014 1735   MCV 103.3* 02/09/2015 0447  MCV 105* 01/28/2014 1735   NEUTROABS 6.6* 09/10/2012 0355   LYMPHSABS 1.1 09/10/2012 0355   MONOABS 1.0* 09/10/2012 0355   EOSABS 0.1 09/10/2012 0355   BASOSABS 0.0 09/10/2012 0355   Comprehensive Metabolic Panel:    Component Value Date/Time   NA 144 02/10/2015 1225   NA 141 01/28/2014  1735   K 3.7 02/10/2015 1225   K 3.8 01/28/2014 1735   CL 108 02/10/2015 1225   CL 105 01/28/2014 1735   CO2 29 02/10/2015 1225   CO2 32 01/28/2014 1735   BUN 19 02/10/2015 1225   BUN 25* 01/28/2014 1735   CREATININE 0.43* 02/10/2015 1225   CREATININE 0.68 01/28/2014 1735   GLUCOSE 114* 02/10/2015 1225   GLUCOSE 96 01/28/2014 1735   CALCIUM 8.8* 02/10/2015 1225   CALCIUM 8.3* 01/28/2014 1735   AST 25 02/08/2015 1626   AST 33 01/28/2014 1735   ALT 12* 02/08/2015 1626   ALT 21 01/28/2014 1735   ALKPHOS 40 02/08/2015 1626   ALKPHOS 101 01/28/2014 1735   BILITOT 0.3 02/08/2015 1626   BILITOT 0.2 01/28/2014 1735   PROT 5.5* 02/08/2015 1626   PROT 6.4 01/28/2014 1735   ALBUMIN 3.0* 02/08/2015 1626   ALBUMIN 2.9* 01/28/2014 1735     More than 50% of the visit was spent in counseling/coordination of care: Yes  Time Spent: 55 minutes

## 2015-02-12 NOTE — Progress Notes (Signed)
CSW contacted [facility] to make a referral for concerns about patient's housing conditions and confused state. CSW spoke to [facility] Intake Worker and made the referral. CSW informed APS that patient is going to SNF [facility]) at discharge but there are concerns of patient's safety when she returns home. CSW reported that patient's son reported that she isn't bathing, eating or cleaning her home. CSW also reported that patient was found in her home unresponsive when EMS was called. CSW will continue to follow and assist as needed.   Ernest Pine, MSW, [place] Social Work Department [phone]

## 2015-02-13 DIAGNOSIS — G40909 Epilepsy, unspecified, not intractable, without status epilepticus: Secondary | ICD-10-CM | POA: Diagnosis not present

## 2015-02-13 LAB — CULTURE, BLOOD (ROUTINE X 2)
CULTURE: NO GROWTH
CULTURE: NO GROWTH

## 2015-02-13 MED ORDER — MUPIROCIN 2 % EX OINT
1.0000 "application " | TOPICAL_OINTMENT | Freq: Two times a day (BID) | CUTANEOUS | Status: DC
  Administered 2015-02-13 – 2015-02-14 (×3): 1 via NASAL
  Filled 2015-02-13: qty 22

## 2015-02-13 MED ORDER — ENSURE ENLIVE PO LIQD
237.0000 mL | Freq: Three times a day (TID) | ORAL | Status: DC
  Administered 2015-02-13 – 2015-02-14 (×3): 237 mL via ORAL

## 2015-02-13 MED ORDER — DOCUSATE SODIUM 100 MG PO CAPS
200.0000 mg | ORAL_CAPSULE | Freq: Two times a day (BID) | ORAL | Status: DC
  Administered 2015-02-13 – 2015-02-14 (×3): 200 mg via ORAL
  Filled 2015-02-13 (×3): qty 2

## 2015-02-13 MED ORDER — CHLORHEXIDINE GLUCONATE CLOTH 2 % EX PADS
6.0000 | MEDICATED_PAD | Freq: Every day | CUTANEOUS | Status: DC
  Administered 2015-02-13 – 2015-02-14 (×2): 6 via TOPICAL

## 2015-02-13 MED ORDER — MIDODRINE HCL 5 MG PO TABS
2.5000 mg | ORAL_TABLET | Freq: Two times a day (BID) | ORAL | Status: DC
  Administered 2015-02-14: 2.5 mg via ORAL
  Filled 2015-02-13 (×4): qty 1

## 2015-02-13 MED ORDER — SENNOSIDES-DOCUSATE SODIUM 8.6-50 MG PO TABS
1.0000 | ORAL_TABLET | Freq: Two times a day (BID) | ORAL | Status: DC
  Administered 2015-02-13 – 2015-02-14 (×3): 1 via ORAL
  Filled 2015-02-13 (×3): qty 1

## 2015-02-13 NOTE — Plan of Care (Signed)
Problem: Health Behavior/Discharge Planning: Goal: Ability to manage health-related needs will improve Outcome: Adequate for Discharge Pt will be discharge to a skilled nursing facility.  She is at her baseline for self management

## 2015-02-13 NOTE — Plan of Care (Signed)
Problem: Skin Integrity: Goal: Risk for impaired skin integrity will decrease Outcome: Progressing Pt moves herself around in bed. Skin becomes reddened easily.

## 2015-02-13 NOTE — Plan of Care (Signed)
Problem: Health Behavior/Discharge Planning: Goal: Ability to manage health-related needs will improve Outcome: Completed/Met Date Met:  02/13/15 Pt appears to be at her health baseline.

## 2015-02-13 NOTE — Plan of Care (Signed)
Problem: Bowel/Gastric: Goal: Will not experience complications related to bowel motility Outcome: Progressing Pt was transferred to 1C this shift. Alert to self and place. No c/o pain nor distress noted. 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 Junctional rhythm on monitor. Continue to monitor.

## 2015-02-13 NOTE — Clinical Social Work Note (Signed)
Clinical Social Worker updated [facility] that pt will likely be discharging tomorrow, per MD. Facility is aware of discharge plan and will be able to accept tomorrow. CSW will continue to follow.   Darden Dates, MSW, LCSW Clinical Social Worker  [phone]

## 2015-02-13 NOTE — Plan of Care (Signed)
Problem: Physical Regulation: Goal: Ability to maintain clinical measurements within normal limits will improve Outcome: Completed/Met Date Met:  02/13/15 Pt is at baseline

## 2015-02-13 NOTE — Progress Notes (Signed)
Nutrition Follow-up  DOCUMENTATION CODES:   Severe malnutrition in context of chronic illness  INTERVENTION:  Meals and snacks: Cater to pt preferences Medical Nutrition Supplement Therapy: Continue ensure as pt drinking well Coordination of care: will ask nursing to document intake as pt on isolation and host/hostess staff will not document intake   NUTRITION DIAGNOSIS:   Malnutrition related to poor appetite, chronic illness as evidenced by severe depletion of body fat, severe depletion of muscle mass.    GOAL:   Patient will meet greater than or equal to 90% of their needs  Not meeting nutritional needs  MONITOR:    (Energy intake, Anthropometrics)  REASON FOR ASSESSMENT:   Consult Assessment of nutrition requirement/status  ASSESSMENT:      Palliative care meeting with family   Current Nutrition: ate 1 pancake this am, drank 100% of ensure and few sips of coffee per CNA, Ameila.  Average recorded po intake 38% of meals, noted some meals not recorded.   Gastrointestinal Profile: Last BM: unknown   Scheduled Medications:  . aspirin  81 mg Oral Daily  . Chlorhexidine Gluconate Cloth  6 each Topical Q0600  . enoxaparin (LOVENOX) injection  30 mg Subcutaneous Q24H  . feeding supplement (ENSURE ENLIVE)  237 mL Oral TID  . levETIRAcetam  500 mg Oral BID  . megestrol  400 mg Oral Daily  . mupirocin ointment  1 application Nasal BID     Electrolyte/Renal Profile and Glucose Profile:   Recent Labs Lab 02/07/15 2356 02/08/15 1626 02/10/15 1225  NA 146* 148* 144  K 3.8 3.2* 3.7  CL 110 117* 108  CO2 '30 26 29  '$ BUN '16 11 19  '$ CREATININE 0.47 0.36* 0.43*  CALCIUM 8.8* 8.2* 8.8*  GLUCOSE 97 85 114*   Protein Profile:  Recent Labs Lab 02/07/15 2356 02/08/15 1626  ALBUMIN 3.7 3.0*      Weight Trend since Admission: Filed Weights   02/11/15 0440 02/12/15 0533 02/13/15 0656  Weight: 75 lb 14.4 oz (34.428 kg) 78 lb 4.8 oz (35.517 kg) 85 lb (38.556  kg)      Diet Order:  DIET DYS 3 Room service appropriate?: Yes; Fluid consistency:: Thin  Skin:   reviewed   Height:   Ht Readings from Last 1 Encounters:  02/07/15 '5\' 2"'$  (1.575 m)    Weight:   Wt Readings from Last 1 Encounters:  02/13/15 85 lb (38.556 kg)    Ideal Body Weight:     BMI:  Body mass index is 15.54 kg/(m^2).  Estimated Nutritional Needs:   Kcal:  BEE 973 kcals (IF 1.0-1.2, AF 1.3) 2831-5176 kcals/d. Using IBW of 50kg  Protein:  (1.0-1.3 g/kg) 50-65 g/d  Fluid:  (30-56m/kg) 1500-17578md  EDUCATION NEEDS:   No education needs identified at this time  MOMinneiskaAlZenia ResidesRDVernonLDSouth Beachpager) Weekend/On-Call pager [phone]

## 2015-02-13 NOTE — Progress Notes (Signed)
[place] [place] NAME: Traci Holmes    MR#:  [id number]  DATE OF BIRTH:  1945/09/06  SUBJECTIVE:  CHIEF COMPLAINT:   Patient more awake and alert denying any symptoms and pressure intermittently low  REVIEW OF SYSTEMS:   CONSTITUTIONAL: No documented fever. No fatigue, weakness. No weight gain, no weight loss.  EYES: No blurry or double vision.  ENT: No tinnitus. No postnasal drip. No redness of the oropharynx.  RESPIRATORY: No cough, no wheeze, no hemoptysis. No dyspnea.  CARDIOVASCULAR: No chest pain. No orthopnea. No palpitations. No syncope.  GASTROINTESTINAL: No nausea, no vomiting or diarrhea. No abdominal pain. No melena or hematochezia.  GENITOURINARY:  No urgency. No frequency. No dysuria. No hematuria. No obstructive symptoms. No discharge. No pain. No significant abnormal bleeding ENDOCRINE: No polyuria or nocturia. No heat or cold intolerance.  HEMATOLOGY: No anemia. No bruising. No bleeding. No purpura. No petechiae INTEGUMENTARY: No rashes. No lesions.  MUSCULOSKELETAL: No arthritis. No swelling. No gout.  NEUROLOGIC: No numbness, tingling, or ataxia. No seizure-type activity.  PSYCHIATRIC: No anxiety. No insomnia. No ADD.    DRUG ALLERGIES:   Allergies  Allergen Reactions  . Penicillins Other (See Comments)    Reaction: unknown  Patient cannot answer follow-up questions    VITALS:  Blood pressure 94/41, pulse 73, temperature 98.2 F (36.8 C), temperature source Oral, resp. rate 18, height '5\' 2"'$  (1.575 m), weight 38.556 kg (85 lb), SpO2 92 %.  PHYSICAL EXAMINATION:  GENERAL:  69 y.o.-year-old patient lying in the bed with no acute distress.  EYES: Pupils equal, round, reactive to light and accommodation. No scleral icterus.  HEENT: Head atraumatic, normocephalic. Oropharynx and nasopharynx clear. Some bruises are noticed on the face NECK:  Supple, no jugular venous distention. No thyroid enlargement, no  tenderness.  LUNGS: Moderate breath sounds bilaterally, no wheezing, rales,rhonchi or crepitation. No use of accessory muscles of respiration.  CARDIOVASCULAR: S1, S2 normal. No murmurs, rubs, or gallops.  ABDOMEN: Soft, nontender, nondistended. Bowel sounds present. No organomegaly or mass.  EXTREMITIES: No pedal edema, cyanosis, or clubbing.  NEUROLOGIC: Altered mental status. G gait not checked PSYCHIATRIC: The patient is disorienteait  SKIN: No obvious rash, lesion, or ulcer.    LABORATORY PANEL:   CBC  Recent Labs Lab 02/09/15 0447  WBC 5.1  HGB 13.7  HCT 42.9  PLT 162   ------------------------------------------------------------------------------------------------------------------  Chemistries   Recent Labs Lab 02/08/15 1626 02/10/15 1225  NA 148* 144  K 3.2* 3.7  CL 117* 108  CO2 26 29  GLUCOSE 85 114*  BUN 11 19  CREATININE 0.36* 0.43*  CALCIUM 8.2* 8.8*  AST 25  --   ALT 12*  --   ALKPHOS 40  --   BILITOT 0.3  --    ------------------------------------------------------------------------------------------------------------------  Cardiac Enzymes  Recent Labs Lab 02/08/15 1626  TROPONINI 0.36*   ------------------------------------------------------------------------------------------------------------------  RADIOLOGY:  No results found.  EKG:   Orders placed or performed during the hospital encounter of 02/07/15  . ED EKG  . ED EKG  . EKG 12-Lead  . EKG 12-Lead    ASSESSMENT AND PLAN:  This is a 69 year old female admitted for seizure-like activity and elevated troponin. #. Altered mental status probably post ictal with acute respiratory failure with hypercapnia on her baseline Alzheimer's dementia  MRI is negative for acute CVA  patient's mental status improving   # Malnourishment: With failure to thrive: The patient's BMI is 14.9; Consult palliative  care-pending Patient started on Megace   #Acute respiratory failure with  hypercapnia which could lower the seizure threshold -resolved  Continue close monitoring. Appreciate pulmonology recommendations  #New-onset seizures Continue Keppra 500 mg by mouth twice a day Follow-up with [facility] neurology in 3 months Appreciate neurology's recommendations  #. Elevated troponin: No EKG evidence of myocardial infarction. Increase in troponin may be secondary to prolonged seizure activity. Troponins are trending down  #hypotension start patient on some Midrin Dean  #. Dementia: Probably from Alzheimer's disease.   #. DVT prophylaxis: Heparin #. GI prophylaxis: ppi  #Generalized weakness, PT has recommended skilled nursing facility.   #disposition: Skilled nursing facility likely tomorrow   All the records are reviewed and case discussed with Care Management/Social Workerr. Management plans discussed with the patient, family and they are in agreement.  CODE STATUS: Full code  TOTAL critical care  TIME TAKING CARE OF THIS PATIENT: 25 minutes.      Dustin Flock M.D on 02/13/2015 at 12:44 PM  Between 7am to 6pm - Pager - [phone] After 6pm go to [email or web address] - password [email or web address] [facility]  Office  [phone]  CC: Primary care physician; RICE, Alinda Deem, PA-C

## 2015-02-13 NOTE — Plan of Care (Signed)
Problem: Activity: Goal: Risk for activity intolerance will decrease Outcome: Progressing Pt moves around in bed and rest as she needs.

## 2015-02-13 NOTE — Progress Notes (Signed)
Pharmacy Consult for Lovenox Indication: VTE prophylaxis  Allergies  Allergen Reactions  . Penicillins Other (See Comments)    Reaction: unknown  Patient cannot answer follow-up questions    Patient Measurements: Height: '5\' 2"'$  (157.5 cm) Weight: 85 lb (38.556 kg) IBW/kg (Calculated) : 50.1  Vital Signs:  Temp: 98.2 F (36.8 C) (12/23 0540) Temp Source: Oral (12/23 0540) BP: 94/41 mmHg (12/23 0540) Pulse Rate: 73 (12/23 0540)  Labs:  Recent Labs  02/10/15 1225  CREATININE 0.43*    Estimated Creatinine Clearance: 39.9 mL/min (by C-G formula based on Cr of 0.43).  Assessment: 69 y/o F admitted with new-onset seizures previously on heparin for dvt prophylaxis to change to LMWH. Patient weight is 34.4 kg.   Est CrCl~40 mL/min   Plan:  Will continue Lovenox 30 mg daily at 2200 for wt <45 kg. Will continue to follow anticoagulation monitoring.   Charlean Carneal G 02/13/2015,8:13 AM

## 2015-02-13 NOTE — Progress Notes (Signed)
Palliative Care Update  Consult is initiated and will be completed in the morning.    Colleen Can, MD

## 2015-02-13 NOTE — Plan of Care (Signed)
Problem: Education: Goal: Knowledge of [facility] Education information/materials will improve Outcome: Adequate for Discharge Pt has a general basic knowledge of pain, but retains minimal  information educated.  Sje is aware that she is going to [facility] possible tomorrow. zPneumonia vaccine given.

## 2015-02-13 NOTE — Care Management Important Message (Signed)
Important Message  Patient Details  Name: Traci Holmes MRN: [id number] Date of Birth: April 07, 1945   Medicare Important Message Given:  Yes    Shelbie Ammons, RN 02/13/2015, 10:05 AM

## 2015-02-13 NOTE — Plan of Care (Signed)
Problem: Pain Managment: Goal: General experience of comfort will improve Outcome: Completed/Met Date Met:  02/13/15 Pt denies pain and shows no obvious signs     

## 2015-02-14 LAB — CREATININE, SERUM
CREATININE: 0.48 mg/dL (ref 0.44–1.00)
GFR calc non Af Amer: >60 mL/min (ref >60)

## 2015-02-14 MED ORDER — LEVETIRACETAM 500 MG PO TABS: 500.0000 mg | ORAL_TABLET | Freq: Two times a day (BID) | ORAL | Status: AC

## 2015-02-14 MED ORDER — MEGESTROL ACETATE 400 MG/10ML PO SUSP: 400.0000 mg | Freq: Every day | ORAL | Status: DC

## 2015-02-14 MED ORDER — MIDODRINE HCL 2.5 MG PO TABS: 2.5000 mg | ORAL_TABLET | Freq: Two times a day (BID) | ORAL | Status: DC

## 2015-02-14 MED ORDER — ALBUTEROL SULFATE (2.5 MG/3ML) 0.083% IN NEBU: 2.5000 mg | INHALATION_SOLUTION | RESPIRATORY_TRACT | Status: DC | PRN

## 2015-02-14 MED ORDER — ENSURE ENLIVE PO LIQD: 237.0000 mL | Freq: Three times a day (TID) | ORAL | Status: DC

## 2015-02-14 MED ORDER — ASPIRIN 81 MG PO CHEW: 81.0000 mg | CHEWABLE_TABLET | Freq: Every day | ORAL | Status: DC

## 2015-02-14 MED ORDER — DOCUSATE SODIUM 100 MG PO CAPS: 200.0000 mg | ORAL_CAPSULE | Freq: Two times a day (BID) | ORAL | Status: AC

## 2015-02-14 NOTE — Plan of Care (Signed)
Problem: Physical Regulation: Goal: Will remain free from infection Outcome: Progressing Pt continues on MRSA precautions.  Problem: Activity: Goal: Risk for activity intolerance will decrease Outcome: Progressing Pt able to turn self and move around in bed very well.

## 2015-02-14 NOTE — Discharge Instructions (Signed)
°  DIET:  Cardiac diet  DISCHARGE CONDITION:  Stable  ACTIVITY:  Activity as tolerated, with assitance  OXYGEN:  Home Oxygen: No.   Oxygen Delivery: room air  DISCHARGE LOCATION:  nursing home    ADDITIONAL DISCHARGE INSTRUCTION:pt eval and treat pallative care f/u at snf  If you experience worsening of your admission symptoms, develop shortness of breath, life threatening emergency, suicidal or homicidal thoughts you must seek medical attention immediately by calling 911 or calling your MD immediately  if symptoms less severe.  You Must read complete instructions/literature along with all the possible adverse reactions/side effects for all the Medicines you take and that have been prescribed to you. Take any new Medicines after you have completely understood and accpet all the possible adverse reactions/side effects.   Please note  You were cared for by a hospitalist during your hospital stay. If you have any questions about your discharge medications or the care you received while you were in the hospital after you are discharged, you can call the unit and asked to speak with the hospitalist on call if the hospitalist that took care of you is not available. Once you are discharged, your primary care physician will handle any further medical issues. Please note that NO REFILLS for any discharge medications will be authorized once you are discharged, as it is imperative that you return to your primary care physician (or establish a relationship with a primary care physician if you do not have one) for your aftercare needs so that they can reassess your need for medications and monitor your lab values.

## 2015-02-14 NOTE — Discharge Summary (Signed)
Traci Holmes, 69 y.o., DOB 1945-06-29, MRN [id number]. Admission date: 02/07/2015 Discharge Date 02/14/2015 Primary MD RICE, Alinda Deem, PA-C Admitting Physician Harrie Foreman, MD  Admission Diagnosis  Seizure [facility]) [R56.9] Elevated troponin [R79.89] Altered mental status, unspecified altered mental status type [R41.82]  Discharge Diagnosis   Active Problems:   Status epilepticus ([place])   Protein-calorie malnutrition, severe   Altered mental status   Elevated troponin   Seizure (HCC)   Bradycardia   Demand ischemia ([place]) reason for elevated troponin   Hypotension Alzheimer's Dementia Major neuro cognitive disorder unspecified Acute respiratory failure with hypercapnia    Hospital Course   The patient presents to the emergency department via EMS after being found in her home unresponsive in what appeared to be seizure-like activity. The family reported that the patient called to complain about her legs feeling numb and tingly. The niece arrived at the patient's home approximately 5 minutes later and was unable to gain entry. As she knocked on the door she could hear banging inside of the house as if something were repeatedly hitting a wall or the floor. First responders had to go through a window to enter the home and found the patient on the floor "shaking". Patient was started on Keppra for seizure. She was seen by neurology had MRI of the brain which was negative for an acute stroke. Patient also was noted to have acute respiratory failure with hypercapnia due to unclear etiology however that is resolved. Patient's mental status is now significantly improved. She is very weak and deconditioned. And in need of further rehabilitation.          Consults  neurology  Significant Tests:  See full reports for all details      Ct Head Wo Contrast  02/08/2015  CLINICAL DATA:  Altered mental status and seizure. History of major neuro cognitive disorder. EXAM: CT HEAD WITHOUT CONTRAST  TECHNIQUE: Contiguous axial images were obtained from the base of the skull through the vertex without intravenous contrast. COMPARISON:  08/25/2014 FINDINGS: Diffuse cerebral atrophy. Mild ventricular dilatation consistent with central atrophy. Patchy low-attenuation changes throughout the deep white matter most likely to represent small vessel ischemic change. No mass effect or midline shift. No abnormal extra-axial fluid collections. Gray-white matter junctions are distinct. Basal cisterns are not effaced. No evidence of acute intracranial hemorrhage. No depressed skull fractures. Visualized paranasal sinuses and mastoid air cells are not opacified. Vascular calcifications. IMPRESSION: No acute intracranial abnormalities. Chronic atrophy and small vessel ischemic changes. Electronically Signed   By: Lucienne Capers M.D.   On: 02/08/2015 00:30   Mr Jeri Cos BW Contrast  02/09/2015  CLINICAL DATA:  Seizure EXAM: MRI HEAD WITHOUT AND WITH CONTRAST TECHNIQUE: Multiplanar, multiecho pulse sequences of the brain and surrounding structures were obtained without and with intravenous contrast. CONTRAST:  4m MULTIHANCE GADOBENATE DIMEGLUMINE 529 MG/ML IV SOLN COMPARISON:  CT head 02/08/2015 FINDINGS: Image quality degraded by moderate motion. All sequences degraded by motion. Mild atrophy.  Negative for hydrocephalus. Negative for acute infarct. Moderate chronic microvascular ischemic change in the cerebral white matter and pons. Negative for intracranial hemorrhage. Negative for mass or edema.  No shift of the midline structures. Postcontrast imaging reveals normal enhancement. No enhancing mass lesion. Vascular enhancement is normal. Paranasal sinuses clear. Normal orbit bilaterally. Pituitary not enlarged. IMPRESSION: Image quality degraded by moderate motion Chronic microvascular ischemic change. No acute intracranial abnormality. Electronically Signed   By: CFranchot GalloM.D.   On: 02/09/2015 12:19  Dg Chest  Portable 1 View  02/08/2015  CLINICAL DATA:  Altered mental status and seizures. History of dementia, Alzheimer's, and seizures. EXAM: PORTABLE CHEST 1 VIEW COMPARISON:  08/25/2014 FINDINGS: Cardiac enlargement with mild vascular congestion. No edema or consolidation. Diffuse emphysematous changes in the lungs with central interstitial process likely representing chronic bronchitis. No focal airspace disease or consolidation. No blunting of costophrenic angles. No pneumothorax. Calcified and tortuous aorta. Degenerative changes in the spine. IMPRESSION: Cardiac enlargement with mild vascular congestion. Emphysematous changes and chronic bronchitic changes in the lungs. No evidence of active pulmonary disease. Electronically Signed   By: Lucienne Capers M.D.   On: 02/08/2015 00:34       Today   Subjective:   Traci Holmes  doing well denies any complaints  Objective:   Blood pressure 109/40, pulse 65, temperature 98.2 F (36.8 C), temperature source Oral, resp. rate 18, height '5\' 2"'$  (1.575 m), weight 41.776 kg (92 lb 1.6 oz), SpO2 95 %.  . No intake or output data in the 24 hours ending 02/14/15 0910  Exam VITAL SIGNS: Blood pressure 109/40, pulse 65, temperature 98.2 F (36.8 C), temperature source Oral, resp. rate 18, height '5\' 2"'$  (1.575 m), weight 41.776 kg (92 lb 1.6 oz), SpO2 95 %.  GENERAL:  69 y.o.-year-old patient lying in the bed with no acute distress.  EYES: Pupils equal, round, reactive to light and accommodation. No scleral icterus. Extraocular muscles intact.  HEENT: Head atraumatic, normocephalic. Oropharynx and nasopharynx clear.  NECK:  Supple, no jugular venous distention. No thyroid enlargement, no tenderness.  LUNGS: Normal breath sounds bilaterally, no wheezing, rales,rhonchi or crepitation. No use of accessory muscles of respiration.  CARDIOVASCULAR: S1, S2 normal. No murmurs, rubs, or gallops.  ABDOMEN: Soft, nontender, nondistended. Bowel sounds present. No  organomegaly or mass.  EXTREMITIES: No pedal edema, cyanosis, or clubbing.  NEUROLOGIC: Cranial nerves II through XII are intact. Muscle strength 5/5 in all extremities. Sensation intact. Gait not checked.  PSYCHIATRIC: The patient is alert and oriented  to person not agitated Skin: No obvious rash, lesion, or ulcer.   Data Review     CBC w Diff:  Lab Results  Component Value Date   WBC 5.1 02/09/2015   WBC 8.6 01/28/2014   HGB 13.7 02/09/2015   HGB 14.3 01/28/2014   HCT 42.9 02/09/2015   HCT 44.0 01/28/2014   PLT 162 02/09/2015   PLT 302 01/28/2014   LYMPHOPCT 12.9 09/10/2012   MONOPCT 10.9 09/10/2012   EOSPCT 1.1 09/10/2012   BASOPCT 0.5 09/10/2012   CMP:  Lab Results  Component Value Date   NA 144 02/10/2015   NA 141 01/28/2014   K 3.7 02/10/2015   K 3.8 01/28/2014   CL 108 02/10/2015   CL 105 01/28/2014   CO2 29 02/10/2015   CO2 32 01/28/2014   BUN 19 02/10/2015   BUN 25* 01/28/2014   CREATININE 0.48 02/14/2015   CREATININE 0.68 01/28/2014   PROT 5.5* 02/08/2015   PROT 6.4 01/28/2014   ALBUMIN 3.0* 02/08/2015   ALBUMIN 2.9* 01/28/2014   BILITOT 0.3 02/08/2015   BILITOT 0.2 01/28/2014   ALKPHOS 40 02/08/2015   ALKPHOS 101 01/28/2014   AST 25 02/08/2015   AST 33 01/28/2014   ALT 12* 02/08/2015   ALT 21 01/28/2014  .  Micro Results Recent Results (from the past 240 hour(s))  Blood culture (routine x 2)     Status: None   Collection Time: 02/07/15 11:56 PM  Result  Value Ref Range Status   Specimen Description BLOOD RIGHT ARM  Final   Special Requests BOTTLES DRAWN AEROBIC AND ANAEROBIC 4CC  Final   Culture NO GROWTH 5 DAYS  Final   Report Status 02/13/2015 FINAL  Final  Blood culture (routine x 2)     Status: None   Collection Time: 02/07/15 11:58 PM  Result Value Ref Range Status   Specimen Description BLOOD RIGHT ARM  Final   Special Requests BOTTLES DRAWN AEROBIC AND ANAEROBIC 4CC  Final   Culture NO GROWTH 5 DAYS  Final   Report Status  02/13/2015 FINAL  Final  Urine culture     Status: None   Collection Time: 02/08/15 12:45 AM  Result Value Ref Range Status   Specimen Description URINE, RANDOM  Final   Special Requests NONE  Final   Culture NO GROWTH 1 DAY  Final   Report Status 02/09/2015 FINAL  Final  MRSA PCR Screening     Status: Abnormal   Collection Time: 02/08/15  4:32 AM  Result Value Ref Range Status   MRSA by PCR POSITIVE (A) NEGATIVE Final    Comment:        The GeneXpert MRSA Assay (FDA approved for NASAL specimens only), is one component of a comprehensive MRSA colonization surveillance program. It is not intended to diagnose MRSA infection nor to guide or monitor treatment for MRSA infections. CRITICAL RESULT CALLED TO, READ BACK BY AND VERIFIED WITH: ADAM Belmont ON 02/08/15 AT 0630 BY QSD         Code Status Orders        Start     Ordered   02/08/15 0430  Full code   Continuous     02/08/15 0429          Follow-up Information    Follow up with [facility] .   Specialty:  [place]   Contact information:   [place] [phone]      Follow up with md at snf In 7 days.      Discharge Medications     Medication List    TAKE these medications        albuterol (2.5 MG/3ML) 0.083% nebulizer solution  Commonly known as:  PROVENTIL  Take 3 mLs (2.5 mg total) by nebulization every 4 (four) hours as needed for wheezing or shortness of breath.     aspirin 81 MG chewable tablet  Chew 1 tablet (81 mg total) by mouth daily.     docusate sodium 100 MG capsule  Commonly known as:  COLACE  Take 2 capsules (200 mg total) by mouth 2 (two) times daily.     feeding supplement (ENSURE ENLIVE) Liqd  Take 237 mLs by mouth 3 (three) times daily.     levETIRAcetam 500 MG tablet  Commonly known as:  KEPPRA  Take 1 tablet (500 mg total) by mouth 2 (two) times daily.     megestrol 400 MG/10ML suspension   Commonly known as:  MEGACE  Take 10 mLs (400 mg total) by mouth daily.     midodrine 2.5 MG tablet  Commonly known as:  PROAMATINE  Take 1 tablet (2.5 mg total) by mouth 2 (two) times daily with a meal.           Total Time in preparing paper work, data evaluation and todays exam - 35 minutes  Dustin Flock M.D on 02/14/2015 at 9:10 AM  [place]  Office  [phone]

## 2015-02-14 NOTE — Clinical Social Work Note (Signed)
Patient to discharge today to [facility] as arranged by previous CSW. Discharge information sent. Nurse to call report. CSW unable to reach patient's son: [name] and had to leave a message for him at : [phone]. CSW was able to reach patient's niece: [name]: [phone] and she is aware. Shela Leff MSW,LCSW [phone]

## 2015-02-14 NOTE — Progress Notes (Addendum)
Patient discharged to SNF. Report given to Caryl Pina, [place]. EMS to transport.

## 2015-02-14 NOTE — Progress Notes (Signed)
Pt had a couple instances of weeping and anxiety.  She wanted to talk to her sister, so the call was made.  She did not get to talk to her sister, but she did speak with someone and that seemed to have pleased her enough.

## 2015-03-30 ENCOUNTER — Emergency Department: Payer: Medicare Other

## 2015-03-30 ENCOUNTER — Emergency Department
Admission: EM | Admit: 2015-03-30 | Discharge: 2015-03-31 | Disposition: A | Discharge status: Home / Self Care | Payer: Medicare Other | Attending: Student | Admitting: Student

## 2015-03-30 ENCOUNTER — Encounter: Payer: Self-pay | Admitting: Emergency Medicine

## 2015-03-30 DIAGNOSIS — Z79899 Other long term (current) drug therapy: Secondary | ICD-10-CM | POA: Insufficient documentation

## 2015-03-30 DIAGNOSIS — G309 Alzheimer's disease, unspecified: Secondary | ICD-10-CM | POA: Diagnosis not present

## 2015-03-30 DIAGNOSIS — F0280 Dementia in other diseases classified elsewhere without behavioral disturbance: Secondary | ICD-10-CM | POA: Diagnosis not present

## 2015-03-30 DIAGNOSIS — Z880 Allergy status to penicillin: Secondary | ICD-10-CM | POA: Insufficient documentation

## 2015-03-30 DIAGNOSIS — R569 Unspecified convulsions: Secondary | ICD-10-CM | POA: Diagnosis not present

## 2015-03-30 DIAGNOSIS — F17210 Nicotine dependence, cigarettes, uncomplicated: Secondary | ICD-10-CM | POA: Diagnosis not present

## 2015-03-30 DIAGNOSIS — R109 Unspecified abdominal pain: Secondary | ICD-10-CM | POA: Diagnosis present

## 2015-03-30 DIAGNOSIS — Z7982 Long term (current) use of aspirin: Secondary | ICD-10-CM | POA: Diagnosis not present

## 2015-03-30 LAB — CBC WITH DIFFERENTIAL/PLATELET
BASOS ABS: 0.1 10*3/uL (ref 0–0.1)
BASOS PCT: 1 %
EOS ABS: 0.3 10*3/uL (ref 0–0.7)
EOS PCT: 4 %
HCT: 39.1 % (ref 35.0–47.0)
Hemoglobin: 12.8 g/dL (ref 12.0–16.0)
Lymphocytes Relative: 29 %
Lymphs Abs: 2.1 10*3/uL (ref 1.0–3.6)
MCH: 33 pg (ref 26.0–34.0)
MCHC: 32.8 g/dL (ref 32.0–36.0)
MCV: 100.7 fL — ABNORMAL HIGH (ref 80.0–100.0)
MONO ABS: 0.6 10*3/uL (ref 0.2–0.9)
Monocytes Relative: 9 %
Neutro Abs: 4.1 10*3/uL (ref 1.4–6.5)
Neutrophils Relative %: 57 %
PLATELETS: 291 10*3/uL (ref 150–440)
RBC: 3.88 MIL/uL (ref 3.80–5.20)
RDW: 13.7 % (ref 11.5–14.5)
WBC: 7.1 10*3/uL (ref 3.6–11.0)

## 2015-03-30 LAB — URINALYSIS COMPLETE WITH MICROSCOPIC (ARMC ONLY)
Bacteria, UA: NONE SEEN
Bilirubin Urine: NEGATIVE
GLUCOSE, UA: NEGATIVE mg/dL
Hgb urine dipstick: NEGATIVE
Ketones, ur: NEGATIVE mg/dL
Leukocytes, UA: NEGATIVE
Nitrite: NEGATIVE
PROTEIN: NEGATIVE mg/dL
SQUAMOUS EPITHELIAL / LPF: NONE SEEN
Specific Gravity, Urine: 1.006 (ref 1.005–1.030)
pH: 7 (ref 5.0–8.0)

## 2015-03-30 LAB — COMPREHENSIVE METABOLIC PANEL
ALBUMIN: 3.6 g/dL (ref 3.5–5.0)
ALT: 10 U/L — ABNORMAL LOW (ref 14–54)
AST: 17 U/L (ref 15–41)
Alkaline Phosphatase: 49 U/L (ref 38–126)
Anion gap: 5 (ref 5–15)
BUN: 19 mg/dL (ref 6–20)
CHLORIDE: 109 mmol/L (ref 101–111)
CO2: 27 mmol/L (ref 22–32)
Calcium: 9.1 mg/dL (ref 8.9–10.3)
Creatinine, Ser: 0.51 mg/dL (ref 0.44–1.00)
GFR calc Af Amer: >60 mL/min (ref >60)
GLUCOSE: 99 mg/dL (ref 65–99)
POTASSIUM: 4.1 mmol/L (ref 3.5–5.1)
SODIUM: 141 mmol/L (ref 135–145)
Total Bilirubin: 0.4 mg/dL (ref 0.3–1.2)
Total Protein: 6.3 g/dL — ABNORMAL LOW (ref 6.5–8.1)

## 2015-03-30 LAB — LIPASE, BLOOD: LIPASE: 33 U/L (ref 11–51)

## 2015-03-30 LAB — TROPONIN I: TROPONIN I: 0.03 ng/mL (ref <0.031)

## 2015-03-30 MED ORDER — FENTANYL CITRATE (PF) 100 MCG/2ML IJ SOLN
50.0000 ug | Freq: Once | INTRAMUSCULAR | Status: AC
  Administered 2015-03-30: 50 ug via INTRAVENOUS
  Filled 2015-03-30: qty 2

## 2015-03-30 MED ORDER — LEVETIRACETAM 500 MG PO TABS
500.0000 mg | ORAL_TABLET | Freq: Once | ORAL | Status: AC
  Administered 2015-03-30: 500 mg via ORAL
  Filled 2015-03-30: qty 1

## 2015-03-30 MED ORDER — SODIUM CHLORIDE 0.9 % IV BOLUS (SEPSIS)
500.0000 mL | Freq: Once | INTRAVENOUS | Status: AC
  Administered 2015-03-30: 500 mL via INTRAVENOUS

## 2015-03-30 MED ORDER — ONDANSETRON HCL 4 MG/2ML IJ SOLN
4.0000 mg | Freq: Once | INTRAMUSCULAR | Status: AC
  Administered 2015-03-30: 4 mg via INTRAVENOUS
  Filled 2015-03-30: qty 2

## 2015-03-30 NOTE — ED Provider Notes (Addendum)
[facility] Department Provider Note  ____________________________________________  Time seen: Approximately 9:30 PM  I have reviewed the triage vital signs and the nursing notes.   HISTORY  Chief Complaint Abdominal Pain  Caveat-history of present illness review of systems is limited due to the patient's dementia. Information is obtained from EMS and staff at [facility] and partially from the patient.  HPI Traci DEFINO is a 70 y.o. female with history of dementia, seizures on keppra who presents for evaluation of abdominal pain, chest pain and seizure this evening, sudden onset just prior to arrival, now resolved. According to staff at [facility], the patient had an uneventful day and then just prior to arrival complained of some mild chest discomfort and abdominal discomfort after which she had "a mild seizure... and then she went into a bigger seizure". On EMS arrival she was complaining of pain. According to staff at her living facility, she has had no recent illness including no fevers, vomiting, diarrhea, chills, no recent falls. She is otherwise been in her usual state of health. The patient denies any chest or abdominal pain at this time.   Past Medical History  Diagnosis Date  . Major neurocognitive disorder, unspecified 08/25/2014  . Dementia   . Alzheimer disease     Patient Active Problem List   Diagnosis Date Noted  . Altered mental status   . Elevated troponin   . Seizure ([place])   . Bradycardia   . Demand ischemia ([place])   . Protein-calorie malnutrition, severe 02/09/2015  . Status epilepticus ([place]) 02/08/2015  . Dementia, old age   . Major neurocognitive disorder, unspecified 08/25/2014    History reviewed. No pertinent past surgical history.  Current Outpatient Rx  Name  Route  Sig  Dispense  Refill  . albuterol (PROVENTIL) (2.5 MG/3ML) 0.083% nebulizer solution   Nebulization   Take 3 mLs (2.5 mg total) by nebulization every  4 (four) hours as needed for wheezing or shortness of breath.   75 mL   12   . aspirin 81 MG chewable tablet   Oral   Chew 1 tablet (81 mg total) by mouth daily.   1 tablet   0   . docusate sodium (COLACE) 100 MG capsule   Oral   Take 2 capsules (200 mg total) by mouth 2 (two) times daily.   10 capsule   0   . feeding supplement, ENSURE ENLIVE, (ENSURE ENLIVE) LIQD   Oral   Take 237 mLs by mouth 3 (three) times daily.   237 mL   12   . levETIRAcetam (KEPPRA) 500 MG tablet   Oral   Take 1 tablet (500 mg total) by mouth 2 (two) times daily.   10 tablet   0   . megestrol (MEGACE) 400 MG/10ML suspension   Oral   Take 10 mLs (400 mg total) by mouth daily.   240 mL   0   . midodrine (PROAMATINE) 2.5 MG tablet   Oral   Take 1 tablet (2.5 mg total) by mouth 2 (two) times daily with a meal.   10 tablet   0     Allergies Penicillins  No family history on file.  Social History Social History  Substance Use Topics  . Smoking status: Current Every Day Smoker -- 1.00 packs/day    Types: Cigarettes  . Smokeless tobacco: None  . Alcohol Use: No    Review of Systems Constitutional: No fever/chills Gastrointestinal: No vomiting.  No diarrhea.  No constipation.   Caveat-history of present illness review of systems is limited due to the patient's dementia. Information is obtained from EMS and staff at [facility] and partially from the patient. ____________________________________________   PHYSICAL EXAM:  Filed Vitals:   03/30/15 2138 03/30/15 2144 03/30/15 2152 03/30/15 2155  BP: 136/79     Pulse:   65   Temp:  98.2 F (36.8 C)    Resp: 19  19   Height:    '5\' 2"'$  (1.575 m)  Weight:    92 lb (41.731 kg)  SpO2:   100%        Constitutional: Alert and oriented to place and self but not to year. Sitting up in bed, in no acute distress, pleasant. Eyes: Conjunctivae are normal. PERRL. EOMI. Head: Atraumatic. Nose: No congestion/rhinnorhea. Mouth/Throat:  Mucous membranes are moist.  Oropharynx non-erythematous. Neck: No stridor. Supple without meningismus. Cardiovascular: Normal rate, regular rhythm. Grossly normal heart sounds.  Good peripheral circulation. Respiratory: Normal respiratory effort.  No retractions. Lungs CTAB. Gastrointestinal: Soft and nontender. No distention.No CVA tenderness. Genitourinary: deferred Musculoskeletal: No lower extremity tenderness nor edema.  No joint effusions. Neurologic:  Normal speech and language. No gross focal neurologic deficits are appreciated.  Skin:  Skin is warm, dry and intact. No rash noted. Psychiatric: Mood and affect are normal. Speech and behavior are normal.  ____________________________________________   LABS (all labs ordered are listed, but only abnormal results are displayed)  Labs Reviewed  CBC WITH DIFFERENTIAL/PLATELET - Abnormal; Notable for the following:    MCV 100.7 (*)    All other components within normal limits  COMPREHENSIVE METABOLIC PANEL - Abnormal; Notable for the following:    Total Protein 6.3 (*)    ALT 10 (*)    All other components within normal limits  URINALYSIS COMPLETEWITH MICROSCOPIC ([facility] ONLY) - Abnormal; Notable for the following:    Color, Urine COLORLESS (*)    APPearance CLEAR (*)    All other components within normal limits  LIPASE, BLOOD  TROPONIN I   ____________________________________________  EKG  ED ECG REPORT I, Joanne Gavel, the attending physician, personally viewed and interpreted this ECG.   Date: 03/30/2015  EKG Time: 21:38  Rate: 51  Rhythm: sinus bradycardia  Axis: normal  Intervals: short PR interval  ST&T Change: No acute ST elevation. Q waves in V2 and V3. EKG unchanged from 02/08/15.  ____________________________________________  RADIOLOGY  CXR IMPRESSION: No active disease.  CT head - pending  ____________________________________________   PROCEDURES  Procedure(s) performed: None  Critical Care  performed: No  ____________________________________________   INITIAL IMPRESSION / ASSESSMENT AND PLAN / ED COURSE  Pertinent labs & imaging results that were available during my care of the patient were reviewed by me and considered in my medical decision making (see chart for details).  Traci Holmes is a 70 y.o. female with history of dementia, seizures who presents for evaluation of chest pain and abdominal pain as well as witnessed seizure at [employer]. On exam, the patient is very well-appearing and in no acute distress. Vital signs are stable, she is afebrile. She has a  benign cardiopulmonary examination as well as a benign abdominal examination and currently has no pain complaints. Neurological exam appears nonfocal. Blood pressure screening labs, EKG, chest x-ray, CT head. We'll give an extra dose of Keppra and observe. Reassess for disposition.  ----------------------------------------- 11:30 PM on 03/30/2015 ----------------------------------------- CBC and CMP generally unremarkable. Troponin negative. Lipase unremarkable. Urinalysis is not consistent with  infection. Chest x-ray negative. Awaiting CT head. Patient continues to have no complaints at this time. If Ct negative and she has no recurrence of seizure activity, I suspect she will be suitable for discharge with return precautions and close PCP follow-up. Care transferred to Dr. Owens Shark at this time.  ____________________________________________   FINAL CLINICAL IMPRESSION(S) / ED DIAGNOSES  Final diagnoses:  None  Seizure Chest pain Abdominal pain    Joanne Gavel, MD 03/30/15 2359  Joanne Gavel, MD 04/09/15 2248

## 2015-03-30 NOTE — ED Notes (Signed)
From [facility]-  Called out for RUQ abd pain/cp.  VSS. Hx of dementia.  In memory care. sb on monitor.

## 2015-03-31 NOTE — ED Notes (Signed)
Attempted to ambulate patient to bathroom, pt unable to sit at side of bed without assistance.  Pt with hx dementia and reports to this nurse ambulating independently at home.   Called facility where pt resides and was informed that pt is in fact ambulatory at baseline.

## 2015-03-31 NOTE — ED Notes (Signed)
Pt resting in bed, sleeping

## 2015-03-31 NOTE — ED Notes (Signed)
Patient is resting comfortably. 

## 2015-03-31 NOTE — ED Notes (Signed)
MD at bedside. 

## 2015-03-31 NOTE — ED Notes (Signed)
Patient is resting comfortably. sleeping

## 2015-03-31 NOTE — ED Notes (Signed)
Pt assisted with bedpan, linens changed

## 2015-03-31 NOTE — ED Notes (Signed)
Pt discharged back to assited living facility [facility] .

## 2016-07-19 ENCOUNTER — Encounter: Payer: Self-pay | Admitting: Emergency Medicine

## 2016-07-19 ENCOUNTER — Emergency Department
Admission: EM | Admit: 2016-07-19 | Discharge: 2016-07-19 | Disposition: A | Discharge status: Home / Self Care | Payer: Medicare Other | Attending: Emergency Medicine | Admitting: Emergency Medicine

## 2016-07-19 DIAGNOSIS — R42 Dizziness and giddiness: Secondary | ICD-10-CM | POA: Diagnosis not present

## 2016-07-19 DIAGNOSIS — Z7982 Long term (current) use of aspirin: Secondary | ICD-10-CM | POA: Insufficient documentation

## 2016-07-19 DIAGNOSIS — Z79899 Other long term (current) drug therapy: Secondary | ICD-10-CM | POA: Diagnosis not present

## 2016-07-19 DIAGNOSIS — G309 Alzheimer's disease, unspecified: Secondary | ICD-10-CM | POA: Insufficient documentation

## 2016-07-19 DIAGNOSIS — F17210 Nicotine dependence, cigarettes, uncomplicated: Secondary | ICD-10-CM | POA: Insufficient documentation

## 2016-07-19 HISTORY — DX: Hypocalcemia: E83.51

## 2016-07-19 HISTORY — DX: Unspecified convulsions: R56.9

## 2016-07-19 HISTORY — DX: Aphasia: R47.01

## 2016-07-19 HISTORY — DX: Hyperlipidemia, unspecified: E78.5

## 2016-07-19 LAB — URINALYSIS, COMPLETE (UACMP) WITH MICROSCOPIC
BILIRUBIN URINE: NEGATIVE
Bacteria, UA: NONE SEEN
GLUCOSE, UA: NEGATIVE mg/dL
HGB URINE DIPSTICK: NEGATIVE
Ketones, ur: NEGATIVE mg/dL
LEUKOCYTES UA: NEGATIVE
NITRITE: NEGATIVE
PH: 6 (ref 5.0–8.0)
Protein, ur: NEGATIVE mg/dL
RBC / HPF: NONE SEEN RBC/hpf (ref 0–5)
SPECIFIC GRAVITY, URINE: 1.005 (ref 1.005–1.030)

## 2016-07-19 LAB — BASIC METABOLIC PANEL
Anion gap: 7 (ref 5–15)
BUN: 15 mg/dL (ref 6–20)
CALCIUM: 10.1 mg/dL (ref 8.9–10.3)
CO2: 31 mmol/L (ref 22–32)
Chloride: 102 mmol/L (ref 101–111)
Creatinine, Ser: 0.62 mg/dL (ref 0.44–1.00)
GFR calc Af Amer: >60 mL/min (ref >60)
GLUCOSE: 97 mg/dL (ref 65–99)
Potassium: 4 mmol/L (ref 3.5–5.1)
SODIUM: 140 mmol/L (ref 135–145)

## 2016-07-19 LAB — CBC
HCT: 45.2 % (ref 35.0–47.0)
Hemoglobin: 15.2 g/dL (ref 12.0–16.0)
MCH: 31.8 pg (ref 26.0–34.0)
MCHC: 33.6 g/dL (ref 32.0–36.0)
MCV: 94.7 fL (ref 80.0–100.0)
PLATELETS: 261 10*3/uL (ref 150–440)
RBC: 4.77 MIL/uL (ref 3.80–5.20)
RDW: 13.8 % (ref 11.5–14.5)
WBC: 9 10*3/uL (ref 3.6–11.0)

## 2016-07-19 NOTE — ED Notes (Signed)
Report given to [facility], per Gwinda Passe, RN. [facility] in route to pick up pt. Pt d/c and taken to lobby for pickup.

## 2016-07-19 NOTE — ED Notes (Signed)
Pt reports dizziness for 1 day.  No n/v/d.  No chest pain or sob.  No headache.  Ambulates without diff.  No slurred speech.  Pt alert.  Denies pain.

## 2016-07-19 NOTE — ED Provider Notes (Signed)
[facility] Emergency Department Provider Note  ____________________________________________  Time seen: Approximately 7:48 PM  I have reviewed the triage vital signs and the nursing notes.   HISTORY  Chief Complaint Dizziness Level 5 caveat:  Portions of the history and physical were unable to be obtained due to the patient's altered mental status from chronic dementia, poor historian    HPI Traci Holmes is a 71 y.o. female who complains of dizziness today described as lightheadedness. It's intermittent, no aggravating or alleviating factors. Last for a few moments at a time. She is not able to further describe it. Denies any pain anywhere. No loss of consciousness or trauma. Denies cough fever chills     Past Medical History:  Diagnosis Date  . Alzheimer disease   . Aphasia   . Dementia   . Hyperlipemia   . Hypocalcemia   . Major neurocognitive disorder, unspecified 08/25/2014  . Seizures [facility])      Patient Active Problem List   Diagnosis Date Noted  . Altered mental status   . Elevated troponin   . Seizure ([place])   . Bradycardia   . Demand ischemia ([place])   . Protein-calorie malnutrition, severe 02/09/2015  . Status epilepticus ([place]) 02/08/2015  . Dementia, old age   . Major neurocognitive disorder, unspecified 08/25/2014     History reviewed. No pertinent surgical history.   Prior to Admission medications   Medication Sig Start Date End Date Taking? Authorizing Provider  albuterol (PROVENTIL) (2.5 MG/3ML) 0.083% nebulizer solution Take 3 mLs (2.5 mg total) by nebulization every 4 (four) hours as needed for wheezing or shortness of breath. 02/14/15   Dustin Flock, MD  aspirin 81 MG chewable tablet Chew 1 tablet (81 mg total) by mouth daily. 02/14/15   Dustin Flock, MD  docusate sodium (COLACE) 100 MG capsule Take 2 capsules (200 mg total) by mouth 2 (two) times daily. 02/14/15   Dustin Flock, MD  feeding supplement, ENSURE  ENLIVE, (ENSURE ENLIVE) LIQD Take 237 mLs by mouth 3 (three) times daily. 02/14/15   Dustin Flock, MD  levETIRAcetam (KEPPRA) 500 MG tablet Take 1 tablet (500 mg total) by mouth 2 (two) times daily. 02/14/15   Dustin Flock, MD  megestrol (MEGACE) 400 MG/10ML suspension Take 10 mLs (400 mg total) by mouth daily. 02/14/15   Dustin Flock, MD  midodrine (PROAMATINE) 2.5 MG tablet Take 1 tablet (2.5 mg total) by mouth 2 (two) times daily with a meal. 02/14/15   Dustin Flock, MD     Allergies Penicillins   No family history on file.  Social History Social History  Substance Use Topics  . Smoking status: Current Every Day Smoker    Packs/day: 1.00    Types: Cigarettes  . Smokeless tobacco: Never Used  . Alcohol use No    Review of Systems  Constitutional:   No fever or chills.  ENT:   No sore throat. No rhinorrhea. Cardiovascular:   No chest pain or syncope. Respiratory:   No dyspnea or cough. Gastrointestinal:   Negative for abdominal pain, vomiting and diarrhea.  Musculoskeletal:   Negative for focal pain or swelling All other systems reviewed and are negative except as documented above in ROS and HPI.  ____________________________________________   PHYSICAL EXAM:  VITAL SIGNS: ED Triage Vitals  Enc Vitals Group     BP 07/19/16 1650 (!) 148/82     Pulse Rate 07/19/16 1645 (!) 52     Resp 07/19/16 1645 16  Temp 07/19/16 1645 98.2 F (36.8 C)     Temp src --      SpO2 07/19/16 1646 94 %     Weight 07/19/16 1645 92 lb (41.7 kg)     Height 07/19/16 1645 5' (1.524 m)     Head Circumference --      Peak Flow --      Pain Score --      Pain Loc --      Pain Edu? --      Excl. in [place]? --     Vital signs reviewed, nursing assessments reviewed.   Constitutional:   Alert and oriented To person and place. Well appearing and in no distress. Eyes:   No scleral icterus. No conjunctival pallor. PERRL. EOMI.  No nystagmus. ENT   Head:   Normocephalic and  atraumatic.   Nose:   No congestion/rhinnorhea.    Mouth/Throat:   MMM, no pharyngeal erythema. No peritonsillar mass.    Neck:   No meningismus. Full ROM Hematological/Lymphatic/Immunilogical:   No cervical lymphadenopathy. Cardiovascular:   Bradycardia heart rate 55. Symmetric bilateral radial and DP pulses.  No murmurs.  Respiratory:   Normal respiratory effort without tachypnea/retractions. Breath sounds are clear and equal bilaterally. No wheezes/rales/rhonchi. Gastrointestinal:   Soft and nontender. Non distended. There is no CVA tenderness.  No rebound, rigidity, or guarding. Genitourinary:   deferred Musculoskeletal:   Normal range of motion in all extremities. No joint effusions.  No lower extremity tenderness.  No edema. Neurologic:   Normal speech and language.  CN 2-10 normal. Motor grossly intact. No gross focal neurologic deficits are appreciated.  Skin:    Skin is warm, dry and intact. No rash noted.  No petechiae, purpura, or bullae.  ____________________________________________    LABS (pertinent positives/negatives) (all labs ordered are listed, but only abnormal results are displayed) Labs Reviewed  URINALYSIS, COMPLETE (UACMP) WITH MICROSCOPIC - Abnormal; Notable for the following:       Result Value   Color, Urine STRAW (*)    APPearance CLEAR (*)    Squamous Epithelial / LPF 0-5 (*)    All other components within normal limits  BASIC METABOLIC PANEL  CBC  CBG MONITORING, ED   ____________________________________________   EKG  Interpreted by me Sinus rhythm rate of 74, normal axis and intervals. Poor R-wave progression in anterior precordial leads. Normal ST segments and T waves.  ____________________________________________    RADIOLOGY  No results found.  ____________________________________________   PROCEDURES Procedures  ____________________________________________   INITIAL IMPRESSION / ASSESSMENT AND PLAN / ED  COURSE  Pertinent labs & imaging results that were available during my care of the patient were reviewed by me and considered in my medical decision making (see chart for details).  Patient well appearing no acute distress, good spirits and energetic. Not orthostatic. Currently asymptomatic. Low suspicion of ACS PE dissection AAA stroke meningitis encephalitis. Possibly mild dehydration. We will discharge home to follow up with primary care.    ____________________________________________   FINAL CLINICAL IMPRESSION(S) / ED DIAGNOSES  Final diagnoses:  Dizziness      New Prescriptions   No medications on file     Portions of this note were generated with dragon dictation software. Dictation errors may occur despite best attempts at proofreading.    Carrie Mew, MD 07/19/16 2005

## 2016-07-19 NOTE — ED Triage Notes (Signed)
Resident of [facility] [address].  Staff report patient c/o dizziness today.  Patient states she does have episodes of dizziness from "time to time".  Patient AAOx3.  Skin warm and dry. MAE

## 2016-09-20 ENCOUNTER — Encounter: Payer: Self-pay | Admitting: *Deleted

## 2016-10-03 ENCOUNTER — Encounter: Payer: Self-pay | Admitting: Emergency Medicine

## 2016-10-03 ENCOUNTER — Emergency Department
Admission: EM | Admit: 2016-10-03 | Discharge: 2016-10-03 | Disposition: A | Discharge status: Home / Self Care | Payer: Medicare Other | Attending: Emergency Medicine | Admitting: Emergency Medicine

## 2016-10-03 ENCOUNTER — Emergency Department: Payer: Medicare Other

## 2016-10-03 DIAGNOSIS — Z79899 Other long term (current) drug therapy: Secondary | ICD-10-CM | POA: Diagnosis not present

## 2016-10-03 DIAGNOSIS — F17210 Nicotine dependence, cigarettes, uncomplicated: Secondary | ICD-10-CM | POA: Diagnosis not present

## 2016-10-03 DIAGNOSIS — G309 Alzheimer's disease, unspecified: Secondary | ICD-10-CM | POA: Diagnosis not present

## 2016-10-03 DIAGNOSIS — R531 Weakness: Secondary | ICD-10-CM | POA: Diagnosis present

## 2016-10-03 DIAGNOSIS — R911 Solitary pulmonary nodule: Secondary | ICD-10-CM | POA: Diagnosis not present

## 2016-10-03 DIAGNOSIS — J449 Chronic obstructive pulmonary disease, unspecified: Secondary | ICD-10-CM | POA: Diagnosis not present

## 2016-10-03 HISTORY — DX: Malignant (primary) neoplasm, unspecified: C80.1

## 2016-10-03 LAB — BASIC METABOLIC PANEL
Anion gap: 9 (ref 5–15)
BUN: 15 mg/dL (ref 6–20)
CALCIUM: 9.2 mg/dL (ref 8.9–10.3)
CHLORIDE: 101 mmol/L (ref 101–111)
CO2: 29 mmol/L (ref 22–32)
CREATININE: 0.57 mg/dL (ref 0.44–1.00)
GFR calc Af Amer: >60 mL/min (ref >60)
GFR calc non Af Amer: >60 mL/min (ref >60)
Glucose, Bld: 115 mg/dL — ABNORMAL HIGH (ref 65–99)
Potassium: 3.8 mmol/L (ref 3.5–5.1)
Sodium: 139 mmol/L (ref 135–145)

## 2016-10-03 LAB — CBC WITH DIFFERENTIAL/PLATELET
Basophils Absolute: 0.1 10*3/uL (ref 0–0.1)
Basophils Relative: 1 %
EOS ABS: 0.1 10*3/uL (ref 0–0.7)
Eosinophils Relative: 1 %
HEMATOCRIT: 42.6 % (ref 35.0–47.0)
Hemoglobin: 14 g/dL (ref 12.0–16.0)
LYMPHS ABS: 1 10*3/uL (ref 1.0–3.6)
Lymphocytes Relative: 6 %
MCH: 31.3 pg (ref 26.0–34.0)
MCHC: 32.9 g/dL (ref 32.0–36.0)
MCV: 95.3 fL (ref 80.0–100.0)
MONO ABS: 0.7 10*3/uL (ref 0.2–0.9)
Monocytes Relative: 5 %
Neutro Abs: 13.8 10*3/uL — ABNORMAL HIGH (ref 1.4–6.5)
Neutrophils Relative %: 87 %
Platelets: 239 10*3/uL (ref 150–440)
RBC: 4.47 MIL/uL (ref 3.80–5.20)
RDW: 13.4 % (ref 11.5–14.5)
WBC: 15.8 10*3/uL — ABNORMAL HIGH (ref 3.6–11.0)

## 2016-10-03 LAB — HEPATIC FUNCTION PANEL
ALK PHOS: 68 U/L (ref 38–126)
ALT: 15 U/L (ref 14–54)
AST: 33 U/L (ref 15–41)
Albumin: 3.9 g/dL (ref 3.5–5.0)
Total Bilirubin: 0.8 mg/dL (ref 0.3–1.2)
Total Protein: 6.6 g/dL (ref 6.5–8.1)

## 2016-10-03 LAB — TROPONIN I: Troponin I: <0.03 ng/mL (ref <0.03)

## 2016-10-03 MED ORDER — PREDNISONE 10 MG PO TABS: 50.0000 mg | ORAL_TABLET | Freq: Every day | ORAL | 0 refills | Status: AC

## 2016-10-03 MED ORDER — IPRATROPIUM-ALBUTEROL 0.5-2.5 (3) MG/3ML IN SOLN
3.0000 mL | Freq: Once | RESPIRATORY_TRACT | Status: AC
  Administered 2016-10-03: 3 mL via RESPIRATORY_TRACT
  Filled 2016-10-03: qty 3

## 2016-10-03 MED ORDER — METHYLPREDNISOLONE SODIUM SUCC 125 MG IJ SOLR
60.0000 mg | Freq: Once | INTRAMUSCULAR | Status: AC
  Administered 2016-10-03: 60 mg via INTRAVENOUS

## 2016-10-03 MED ORDER — AZITHROMYCIN 250 MG PO TABS: ORAL_TABLET | ORAL | 0 refills | Status: AC

## 2016-10-03 MED ORDER — ALBUTEROL SULFATE HFA 108 (90 BASE) MCG/ACT IN AERS: 2.0000 | INHALATION_SPRAY | Freq: Four times a day (QID) | RESPIRATORY_TRACT | 0 refills | Status: AC | PRN

## 2016-10-03 MED ORDER — METHYLPREDNISOLONE SODIUM SUCC 125 MG IJ SOLR
INTRAMUSCULAR | Status: AC
  Filled 2016-10-03: qty 2

## 2016-10-03 MED ORDER — SPACER/AERO CHAMBER MOUTHPIECE MISC: 1.0000 [IU] | 0 refills | Status: AC | PRN

## 2016-10-03 NOTE — ED Notes (Signed)
Pt given coke to drink 

## 2016-10-03 NOTE — ED Triage Notes (Signed)
Pt bib [facility] from [facility] d/t weakness and fatigue x1 day, decreased appetite x3 days. VSS in route, 86%O2 in route on room air-placed on 2L [place] maintaining 98%. Hx dementia, seizures. PT alert and oriented x4

## 2016-10-03 NOTE — ED Provider Notes (Signed)
[facility] Emergency Department Provider Note  ____________________________________________   First MD Initiated Contact with Patient 10/03/16 2006     (approximate)  I have reviewed the triage vital signs and the nursing notes.   HISTORY  Chief Complaint Weakness    HPI Traci Holmes is a 71 y.o. female who is brought to the emergency department via EMS from her assisted living facility against her will for weakness and fatigue for 1 day and decreased appetite for 3 days. EMS noted the patient had a slightly elevated respiratory rate in route with an oxygen saturation of 86%. The patient says she feels well and did not want to come to the hospital but she thought she was required to have someone called 911. She's had mild dry cough. No fevers or chills. No leg swelling. No history of deep vein thrombus or pulmonary embolism. She does have a history of seizure disorder and mild dementia. She is a heavy cigarette smoker but does not think she has COPD.   Past Medical History:  Diagnosis Date  . Alzheimer disease   . Aphasia   . Cancer ([place])   . Dementia   . Hyperlipemia   . Hypocalcemia   . Major neurocognitive disorder, unspecified 08/25/2014  . Seizures [facility])     Patient Active Problem List   Diagnosis Date Noted  . Altered mental status   . Elevated troponin   . Seizure ([place])   . Bradycardia   . Demand ischemia ([place])   . Protein-calorie malnutrition, severe 02/09/2015  . Status epilepticus ([place]) 02/08/2015  . Dementia, old age   . Major neurocognitive disorder, unspecified 08/25/2014    Past Surgical History:  Procedure Laterality Date  . ABDOMINAL HYSTERECTOMY      Prior to Admission medications   Medication Sig Start Date End Date Taking? Authorizing Provider  ARIPiprazole (ABILIFY) 2 MG tablet Take 1 mg by mouth daily.   Yes [provider]  docusate sodium (COLACE) 100 MG capsule Take 2 capsules (200 mg total) by mouth 2  (two) times daily. 02/14/15  Yes Dustin Flock, MD  feeding supplement, ENSURE ENLIVE, (ENSURE ENLIVE) LIQD Take 237 mLs by mouth 3 (three) times daily. 02/14/15  Yes Dustin Flock, MD  fluticasone (FLONASE) 50 MCG/ACT nasal spray Place 1 spray into both nostrils 2 (two) times daily.   Yes [provider]  levETIRAcetam (KEPPRA) 500 MG tablet Take 1 tablet (500 mg total) by mouth 2 (two) times daily. Patient taking differently: Take 500 mg by mouth 3 (three) times daily.  02/14/15  Yes Dustin Flock, MD  memantine (NAMENDA) 10 MG tablet Take 10 mg by mouth 2 (two) times daily.   Yes [provider]  sertraline (ZOLOFT) 100 MG tablet Take 100 mg by mouth daily.   Yes [provider]  Vitamin D, Ergocalciferol, (DRISDOL) 50000 units CAPS capsule Take 50,000 Units by mouth every 7 (seven) days.   Yes [provider]  albuterol (PROVENTIL HFA;VENTOLIN HFA) 108 (90 Base) MCG/ACT inhaler Inhale 2 puffs into the lungs every 6 (six) hours as needed for wheezing or shortness of breath. 10/03/16   Darel Hong, MD  azithromycin (ZITHROMAX Z-PAK) 250 MG tablet Take 2 tablets (500 mg) on  Day 1,  followed by 1 tablet (250 mg) once daily on Days 2 through 5. 10/03/16 10/08/16  Darel Hong, MD  predniSONE (DELTASONE) 10 MG tablet Take 5 tablets (50 mg total) by mouth daily. 10/03/16 10/07/16  Darel Hong, MD  Spacer/Aero Chamber Mouthpiece MISC 1 Units by Does not apply route every 4 (four) hours as needed (wheezing). 10/03/16   Darel Hong, MD    Allergies Penicillins  No family history on file.  Social History Social History  Substance Use Topics  . Smoking status: Current Every Day Smoker    Packs/day: 1.00    Types: Cigarettes  . Smokeless tobacco: Never Used  . Alcohol use No    Review of Systems Constitutional: No fever/chills Eyes: No visual changes. ENT: No sore throat. Cardiovascular: Denies chest pain. Respiratory: Denies shortness of  breath. Gastrointestinal: No abdominal pain.  No nausea, no vomiting.  No diarrhea.  No constipation. Genitourinary: Negative for dysuria. Musculoskeletal: Negative for back pain. Skin: Negative for rash. Neurological: Negative for headaches, focal weakness or numbness.   ____________________________________________   PHYSICAL EXAM:  VITAL SIGNS: ED Triage Vitals [10/03/16 2004]  Enc Vitals Group     BP      Pulse      Resp      Temp      Temp src      SpO2 (!) 86 %     Weight      Height      Head Circumference      Peak Flow      Pain Score      Pain Loc      Pain Edu?      Excl. in [place]?     Constitutional: Alert and oriented 4 and joking laughing very well-appearing nontoxic no diaphoresis speaks in full clear sentences Eyes: PERRL EOMI. Head: Atraumatic. Nose: No congestion/rhinnorhea. Mouth/Throat: No trismus Neck: No stridor.   Cardiovascular: Normal rate, regular rhythm. Grossly normal heart sounds.  Good peripheral circulation. Respiratory: Normal respiratory effort.  No retractions. No wheeze appreciated with slightly decreased air entry particularly on the right Gastrointestinal: Soft nontender Musculoskeletal: No lower extremity edema  legs are equal in size Neurologic:  Normal speech and language. No gross focal neurologic deficits are appreciated. Skin:  Skin is warm, dry and intact. No rash noted. Psychiatric: Mood and affect are normal. Speech and behavior are normal.    ____________________________________________   DIFFERENTIAL includes but not limited to  Pulmonary positive, pneumothorax, pneumonia, COPD exacerbation, metabolic derangement ____________________________________________   LABS (all labs ordered are listed, but only abnormal results are displayed)  Labs Reviewed  BASIC METABOLIC PANEL - Abnormal; Notable for the following:       Result Value   Glucose, Bld 115 (*)    All other components within normal limits  HEPATIC  FUNCTION PANEL - Abnormal; Notable for the following:    Bilirubin, Direct <0.1 (*)    All other components within normal limits  CBC WITH DIFFERENTIAL/PLATELET - Abnormal; Notable for the following:    WBC 15.8 (*)    Neutro Abs 13.8 (*)    All other components within normal limits  TROPONIN I  BRAIN NATRIURETIC PEPTIDE  URINALYSIS, COMPLETE (UACMP) WITH MICROSCOPIC    No signs of acute ischemia __________________________________________  EKG  ED ECG REPORT I, Darel Hong, the attending physician, personally viewed and interpreted this ECG.  Date: 10/03/2016 EKG Time:  Rate: 74 Rhythm: normal sinus rhythm QRS Axis: normal Intervals: normal ST/T Wave abnormalities: Lateral T-wave inversion Narrative Interpretation: Wavy baseline somewhat difficult to interpret but no signs of ST elevation  ____________________________________________  RADIOLOGY  Chest x-ray suggestive of COPD with solitary pulmonary nodule ____________________________________________   PROCEDURES  Procedure(s) performed: no  Procedures  Critical Care performed: no  Observation: no ____________________________________________   INITIAL IMPRESSION / ASSESSMENT AND PLAN / ED COURSE  Pertinent labs & imaging results that were available during my care of the patient were reviewed by me and considered in my medical decision making (see chart for details).  The patient arrives very well-appearing pleasant cooperative. The only positive finding on her exam is somewhat increased respiratory rate and him what decreased air entry. She is a heavy cigarette smoker so she may very well have COPD. X-ray and duo neb pending.  The patient feels significantly improved after her DuoNeb. She saturating 88-89% on room air and is asking to go home which I think is reasonable. I will give her a short burst of steroids and she understands she needs to follow-up with her primary care provider regarding workup of her  isolated pulmonary nodule. She is discharged home in improved condition.      ____________________________________________   FINAL CLINICAL IMPRESSION(S) / ED DIAGNOSES  Final diagnoses:  Chronic obstructive pulmonary disease, unspecified COPD type ([place])  Lung nodule      NEW MEDICATIONS STARTED DURING THIS VISIT:  New Prescriptions   ALBUTEROL (PROVENTIL HFA;VENTOLIN HFA) 108 (90 BASE) MCG/ACT INHALER    Inhale 2 puffs into the lungs every 6 (six) hours as needed for wheezing or shortness of breath.   AZITHROMYCIN (ZITHROMAX Z-PAK) 250 MG TABLET    Take 2 tablets (500 mg) on  Day 1,  followed by 1 tablet (250 mg) once daily on Days 2 through 5.   PREDNISONE (DELTASONE) 10 MG TABLET    Take 5 tablets (50 mg total) by mouth daily.   SPACER/AERO CHAMBER MOUTHPIECE MISC    1 Units by Does not apply route every 4 (four) hours as needed (wheezing).     Note:  This document was prepared using Dragon voice recognition software and may include unintentional dictation errors.     Darel Hong, MD 10/03/16 2124

## 2016-10-04 ENCOUNTER — Ambulatory Visit (INDEPENDENT_AMBULATORY_CARE_PROVIDER_SITE_OTHER): Payer: Medicare Other | Admitting: General Surgery

## 2016-10-04 ENCOUNTER — Encounter: Payer: Self-pay | Admitting: General Surgery

## 2016-10-04 VITALS — BP 110/64 | HR 71 | Resp 12 | Ht 60.0 in | Wt 91.0 lb

## 2016-10-04 DIAGNOSIS — R590 Localized enlarged lymph nodes: Secondary | ICD-10-CM | POA: Diagnosis not present

## 2016-10-04 DIAGNOSIS — R918 Other nonspecific abnormal finding of lung field: Secondary | ICD-10-CM

## 2016-10-04 DIAGNOSIS — R911 Solitary pulmonary nodule: Secondary | ICD-10-CM | POA: Diagnosis not present

## 2016-10-04 NOTE — Patient Instructions (Addendum)
Plan for CT scan of neck and chest to further evaluate  Return to office for needle biopsy of lymph node, to be announced following CT scan  The patient is scheduled for CT of the chest and neck with contrast at [place] on 10/11/16 at 9:00 am. She will arrive by 8:45 am and have nothing but clear liquids for 4 hours prior.

## 2016-10-04 NOTE — Progress Notes (Signed)
Patient ID: Traci Holmes, female   DOB: 1945-12-28, 71 y.o.   MRN: [id number]  Chief Complaint  Patient presents with  . Other    neck mass    HPI Traci Holmes is a 71 y.o. female is here today for an evaluation for a neck masses. Patient states the masses has been present for about a month. They are located bilaterally on her neck Increased in size over the past month. She was seen in the ER for shortness of breath last night-she was given azithromycin, prednisone, and albuterol. She feels a little better today with her shortness of breath. Thayer [place]-Director of [employer] [facility] assisted living here with pt  HPI  Past Medical History:  Diagnosis Date  . Alzheimer disease   . Aphasia   . Cancer ([place])    Uterine Cancer-30 years ago  . Dementia   . Hyperlipemia   . Hypocalcemia   . Major neurocognitive disorder, unspecified 08/25/2014  . Seizures ([place])     Past Surgical History:  Procedure Laterality Date  . ABDOMINAL HYSTERECTOMY     Does not remember date 30-40 years ago?    Family History  Problem Relation Age of Onset  . Cancer Mother        breast  . Cancer Father        prostate    Social History Social History  Substance Use Topics  . Smoking status: Current Every Day Smoker    Packs/day: 1.00    Types: Cigarettes  . Smokeless tobacco: Never Used  . Alcohol use No    Allergies  Allergen Reactions  . Penicillins Other (See Comments)    Reaction: unknown  Patient cannot answer follow-up questions    Current Outpatient Prescriptions  Medication Sig Dispense Refill  . albuterol (PROVENTIL HFA;VENTOLIN HFA) 108 (90 Base) MCG/ACT inhaler Inhale 2 puffs into the lungs every 6 (six) hours as needed for wheezing or shortness of breath. 1 Inhaler 0  . ARIPiprazole (ABILIFY) 2 MG tablet Take 1 mg by mouth daily.    Marland Kitchen azithromycin (ZITHROMAX Z-PAK) 250 MG tablet Take 2 tablets (500 mg) on  Day 1,  followed by 1 tablet (250 mg) once daily on Days 2 through 5. 6 each 0   . Cholecalciferol (VITAMIN D3) 50000 units CAPS Take by mouth.    . docusate sodium (COLACE) 100 MG capsule Take 2 capsules (200 mg total) by mouth 2 (two) times daily. 10 capsule 0  . feeding supplement, ENSURE ENLIVE, (ENSURE ENLIVE) LIQD Take 237 mLs by mouth 3 (three) times daily. 237 mL 12  . fluticasone (FLONASE) 50 MCG/ACT nasal spray Place 1 spray into both nostrils 2 (two) times daily.    Marland Kitchen levETIRAcetam (KEPPRA) 500 MG tablet Take 1 tablet (500 mg total) by mouth 2 (two) times daily. (Patient taking differently: Take 500 mg by mouth 3 (three) times daily. ) 10 tablet 0  . memantine (NAMENDA) 10 MG tablet Take 10 mg by mouth 2 (two) times daily.    . mirtazapine (REMERON) 15 MG tablet Take 15 mg by mouth at bedtime.    . predniSONE (DELTASONE) 10 MG tablet Take 5 tablets (50 mg total) by mouth daily. 20 tablet 0  . promethazine (PHENERGAN) 25 MG tablet Take 25 mg by mouth every 6 (six) hours as needed for nausea or vomiting.    . sertraline (ZOLOFT) 100 MG tablet Take 100 mg by mouth daily.    Marland Kitchen Spacer/Aero Chamber Mouthpiece MISC 1 Units by Does  not apply route every 4 (four) hours as needed (wheezing). 1 each 0  . Vitamin D, Ergocalciferol, (DRISDOL) 50000 units CAPS capsule Take 50,000 Units by mouth every 7 (seven) days.     No current facility-administered medications for this visit.     Review of Systems Review of Systems  Constitutional: Negative.   Respiratory: Negative.   Cardiovascular: Negative.     Blood pressure 110/64, pulse 71, resp. rate 12, height 5' (1.524 m), weight 91 lb (41.3 kg).  Physical Exam Physical Exam  Constitutional: She is oriented to person, place, and time. She appears well-developed and well-nourished.  Appears older than stated age.   Eyes: Conjunctivae are normal. No scleral icterus.  Neck: Neck supple. No thyromegaly present.  Cardiovascular: Normal rate, regular rhythm and normal heart sounds.   Pulmonary/Chest: Effort normal and breath  sounds normal.  Abdominal: Soft. Bowel sounds are normal. There is no tenderness.  Lymphadenopathy:       Head (right side): No submental, no submandibular, no tonsillar, no preauricular, no posterior auricular and no occipital adenopathy present.       Head (left side): No submental, no submandibular, no tonsillar, no preauricular, no posterior auricular and no occipital adenopathy present.    She has cervical adenopathy.       Right cervical: Superficial cervical adenopathy present.       Left cervical: Superficial cervical adenopathy present.    She has no axillary adenopathy.       Right: No inguinal and no supraclavicular adenopathy present.       Left: No inguinal and no supraclavicular adenopathy present.  Multiple firm, smooth, non-tender lymph nodes in the mid and upper-anterior cervical region bilaterally ranging from 5 mm up to 2 cm.   Neurological: She is alert and oriented to person, place, and time.  Skin: Skin is warm and dry.    Data Reviewed Notes from primary care, emergency room  Labs performed   Assessment    Cervical lymphadenopathy-suspicious for malignancy Lung nodule -right upper lobe  Tobacco abuse History of uterine cancer-not sure endometrial or cervical.    Plan   Discussed with pt and her caregiver. Pt has history of dementia-mild, currently on namenda. Appears to be reasonably aware of recent events and prior medical issues   Plan for CT scan of neck and chest to further evaluate Likely will need biopsy of neck node Return to office for needle biopsy of lymph node, to be announced following CT scan     HPI, Physical Exam, Assessment and Plan have been scribed under the direction and in the presence of Mckinley Jewel, MD.  Verlene Mayer, CMA  The patient is scheduled for CT of the chest and neck with contrast at [place] on 10/11/16 at 9:00 am. She will arrive by 8:45 am and have nothing but clear liquids for 4 hours prior.  Documented  by Lesly Rubenstein LPN   I have completed the exam and reviewed the above documentation for accuracy and completeness.  I agree with the above.  Haematologist has been used and any errors in dictation or transcription are unintentional.  Zakir Henner G. Jamal Collin, M.D., F.A.C.S.   Junie Panning G 10/04/2016, 1:04 PM

## 2016-10-11 ENCOUNTER — Ambulatory Visit
Admission: RE | Admit: 2016-10-11 | Discharge: 2016-10-11 | Disposition: A | Discharge status: Home / Self Care | Payer: Medicare Other | Source: Ambulatory Visit | Attending: General Surgery | Admitting: General Surgery

## 2016-10-11 ENCOUNTER — Ambulatory Visit: Admission: RE | Admit: 2016-10-11 | Payer: Medicare Other | Source: Ambulatory Visit

## 2016-10-11 DIAGNOSIS — K862 Cyst of pancreas: Secondary | ICD-10-CM | POA: Insufficient documentation

## 2016-10-11 DIAGNOSIS — I700 Atherosclerosis of aorta: Secondary | ICD-10-CM

## 2016-10-11 DIAGNOSIS — R918 Other nonspecific abnormal finding of lung field: Secondary | ICD-10-CM

## 2016-10-11 DIAGNOSIS — J439 Emphysema, unspecified: Secondary | ICD-10-CM | POA: Insufficient documentation

## 2016-10-11 DIAGNOSIS — N2889 Other specified disorders of kidney and ureter: Secondary | ICD-10-CM

## 2016-10-11 DIAGNOSIS — R590 Localized enlarged lymph nodes: Secondary | ICD-10-CM

## 2016-10-11 DIAGNOSIS — N6489 Other specified disorders of breast: Secondary | ICD-10-CM | POA: Insufficient documentation

## 2016-10-11 DIAGNOSIS — R911 Solitary pulmonary nodule: Secondary | ICD-10-CM | POA: Insufficient documentation

## 2016-10-11 DIAGNOSIS — I2510 Atherosclerotic heart disease of native coronary artery without angina pectoris: Secondary | ICD-10-CM

## 2016-10-11 DIAGNOSIS — D7389 Other diseases of spleen: Secondary | ICD-10-CM | POA: Insufficient documentation

## 2016-10-11 DIAGNOSIS — E278 Other specified disorders of adrenal gland: Secondary | ICD-10-CM | POA: Insufficient documentation

## 2016-10-11 MED ORDER — IOPAMIDOL (ISOVUE-300) INJECTION 61%
75.0000 mL | Freq: Once | INTRAVENOUS | Status: AC | PRN
  Administered 2016-10-11: 75 mL via INTRAVENOUS

## 2016-10-12 ENCOUNTER — Ambulatory Visit: Payer: Medicare Other | Admitting: General Surgery

## 2016-10-12 ENCOUNTER — Encounter: Payer: Self-pay | Admitting: General Surgery

## 2016-10-12 ENCOUNTER — Inpatient Hospital Stay (INDEPENDENT_AMBULATORY_CARE_PROVIDER_SITE_OTHER): Payer: Medicare Other

## 2016-10-12 VITALS — BP 118/84 | HR 66 | Resp 16 | Ht 60.0 in | Wt 82.0 lb

## 2016-10-12 DIAGNOSIS — R590 Localized enlarged lymph nodes: Secondary | ICD-10-CM | POA: Diagnosis not present

## 2016-10-12 DIAGNOSIS — R918 Other nonspecific abnormal finding of lung field: Secondary | ICD-10-CM

## 2016-10-12 NOTE — Progress Notes (Signed)
Patient ID: Traci Holmes, female   DOB: 08-09-1945, 71 y.o.   MRN: [id number]  Chief Complaint  Patient presents with  . Follow-up    HPI Traci Holmes is a 71 y.o. female.  Here today discuss CT scan results done 10-11-16. She does states she is having more dizzy spells. She is a resident of [facility] assisted living and is here with her caregiver.  HPI  Past Medical History:  Diagnosis Date  . Alzheimer disease   . Aphasia   . Cancer [facility])    Uterine Cancer-30 years ago with hysterectomy.  . Dementia   . Hyperlipemia   . Hypocalcemia   . Major neurocognitive disorder, unspecified 08/25/2014  . Seizures ([place])     Past Surgical History:  Procedure Laterality Date  . ABDOMINAL HYSTERECTOMY     Does not remember date 30-40 years ago?    Family History  Problem Relation Age of Onset  . Cancer Mother        breast  . Cancer Father        prostate    Social History Social History  Substance Use Topics  . Smoking status: Current Every Day Smoker    Packs/day: 1.00    Types: Cigarettes  . Smokeless tobacco: Never Used  . Alcohol use No    Allergies  Allergen Reactions  . Penicillins Other (See Comments)    Reaction: unknown  Patient cannot answer follow-up questions    Current Outpatient Prescriptions  Medication Sig Dispense Refill  . albuterol (PROVENTIL HFA;VENTOLIN HFA) 108 (90 Base) MCG/ACT inhaler Inhale 2 puffs into the lungs every 6 (six) hours as needed for wheezing or shortness of breath. 1 Inhaler 0  . ARIPiprazole (ABILIFY) 2 MG tablet Take 1 mg by mouth daily.    . Cholecalciferol (VITAMIN D3) 50000 units CAPS Take by mouth.    . docusate sodium (COLACE) 100 MG capsule Take 2 capsules (200 mg total) by mouth 2 (two) times daily. 10 capsule 0  . feeding supplement, ENSURE ENLIVE, (ENSURE ENLIVE) LIQD Take 237 mLs by mouth 3 (three) times daily. 237 mL 12  . fluticasone (FLONASE) 50 MCG/ACT nasal spray Place 1 spray into both nostrils 2 (two) times  daily.    Marland Kitchen levETIRAcetam (KEPPRA) 500 MG tablet Take 1 tablet (500 mg total) by mouth 2 (two) times daily. (Patient taking differently: Take 500 mg by mouth 3 (three) times daily. ) 10 tablet 0  . memantine (NAMENDA) 10 MG tablet Take 10 mg by mouth 2 (two) times daily.    . mirtazapine (REMERON) 15 MG tablet Take 15 mg by mouth at bedtime.    . promethazine (PHENERGAN) 25 MG tablet Take 25 mg by mouth every 6 (six) hours as needed for nausea or vomiting.    . sertraline (ZOLOFT) 100 MG tablet Take 100 mg by mouth daily.    Marland Kitchen Spacer/Aero Chamber Mouthpiece MISC 1 Units by Does not apply route every 4 (four) hours as needed (wheezing). 1 each 0  . Vitamin D, Ergocalciferol, (DRISDOL) 50000 units CAPS capsule Take 50,000 Units by mouth every 7 (seven) days.     No current facility-administered medications for this visit.     Review of Systems Review of Systems  Constitutional: Negative.   Respiratory: Negative.   Cardiovascular: Negative.   Neurological: Positive for dizziness.    Blood pressure 118/84, pulse 66, resp. rate 16, height 5' (1.524 m), weight 82 lb (37.2 kg).  Physical Exam Physical Exam  Constitutional:  She is oriented to person, place, and time. She appears well-developed and well-nourished.  Neurological: She is alert and oriented to person, place, and time.  Skin: Skin is warm and dry.  Psychiatric: Her behavior is normal.    Data Reviewed CT scans-Large right apical lung mass with additional nodules in the right lung. Multiple other findings to include cervical adenopathy possible brain metastases. Also noted bilateral adrenal metastases. 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 Tissue sampling is indicated for diagnostic confirmation.     Plan   discussed the bile with the patient and she was agreeable to a core biopsy of left cervical  node and this was completed today  Procedure: Left upper cervical lymph node core biopsy with ultrasound guidance  Anesthetic: 6 mL of 1% Xylocaine mixed with half percent Marcaine  Prep: ChloraPrep  Description: After local anesthetic and prepping in draping with towels ultrasound probe was used to localize the 2 adjoining prominent lymph nodes in the upper anterior cervical region. Small stab incision was made. A 14-gauge Bard core biopsy device was then used to obtain 4-5 samples. This was sent to pathology. The skin incision was closed with the Steri-Strips and tincture benzoin. Telfa and Tegaderm dressing was placed. Procedure was well-tolerated with no immediate problems encountered. Patient will be notified once pathology is available. She also has an appointment scheduled with oncology. She may require a PET scan and MRI of the brain patient advised    May shower May remove dressing in 2-3 days Steri strips will gradually come off over 2-3 weeks May use an Ice pack as needed for comfort      HPI, Physical Exam, Assessment and Plan have been scribed under the direction and in the presence of Mckinley Jewel, MD Karie Fetch, RN I have completed the exam and reviewed the above documentation for accuracy and completeness.  I agree with the above.  Haematologist has been used and any errors in dictation or transcription are unintentional.  Neal Trulson G. Jamal Collin, M.D., F.A.C.S.  Junie Panning G 10/14/2016, 8:58 AM

## 2016-10-12 NOTE — Patient Instructions (Signed)
May shower May remove dressing in 2-3 days Steri strips will gradually come off over 2-3 weeks May use an Ice pack as needed for comfort

## 2016-10-14 ENCOUNTER — Emergency Department: Payer: Medicare Other

## 2016-10-14 ENCOUNTER — Other Ambulatory Visit: Payer: Self-pay

## 2016-10-14 ENCOUNTER — Inpatient Hospital Stay
Admission: EM | Admit: 2016-10-14 | Discharge: 2016-10-18 | DRG: 054 | Disposition: A | Discharge status: Home / Self Care | Payer: Medicare Other | Attending: Internal Medicine | Admitting: Internal Medicine

## 2016-10-14 ENCOUNTER — Encounter: Payer: Self-pay | Admitting: Emergency Medicine

## 2016-10-14 DIAGNOSIS — G309 Alzheimer's disease, unspecified: Secondary | ICD-10-CM | POA: Diagnosis present

## 2016-10-14 DIAGNOSIS — C3402 Malignant neoplasm of left main bronchus: Secondary | ICD-10-CM | POA: Diagnosis not present

## 2016-10-14 DIAGNOSIS — Z681 Body mass index (BMI) 19 or less, adult: Secondary | ICD-10-CM

## 2016-10-14 DIAGNOSIS — J9601 Acute respiratory failure with hypoxia: Secondary | ICD-10-CM | POA: Diagnosis present

## 2016-10-14 DIAGNOSIS — S2241XA Multiple fractures of ribs, right side, initial encounter for closed fracture: Secondary | ICD-10-CM | POA: Diagnosis not present

## 2016-10-14 DIAGNOSIS — F79 Unspecified intellectual disabilities: Secondary | ICD-10-CM | POA: Diagnosis present

## 2016-10-14 DIAGNOSIS — D7389 Other diseases of spleen: Secondary | ICD-10-CM | POA: Diagnosis present

## 2016-10-14 DIAGNOSIS — C770 Secondary and unspecified malignant neoplasm of lymph nodes of head, face and neck: Secondary | ICD-10-CM | POA: Diagnosis not present

## 2016-10-14 DIAGNOSIS — K862 Cyst of pancreas: Secondary | ICD-10-CM | POA: Diagnosis present

## 2016-10-14 DIAGNOSIS — W182XXA Fall in (into) shower or empty bathtub, initial encounter: Secondary | ICD-10-CM | POA: Diagnosis present

## 2016-10-14 DIAGNOSIS — F0150 Vascular dementia without behavioral disturbance: Secondary | ICD-10-CM | POA: Diagnosis present

## 2016-10-14 DIAGNOSIS — G936 Cerebral edema: Secondary | ICD-10-CM

## 2016-10-14 DIAGNOSIS — K769 Liver disease, unspecified: Secondary | ICD-10-CM | POA: Diagnosis not present

## 2016-10-14 DIAGNOSIS — E278 Other specified disorders of adrenal gland: Secondary | ICD-10-CM | POA: Diagnosis present

## 2016-10-14 DIAGNOSIS — Z8542 Personal history of malignant neoplasm of other parts of uterus: Secondary | ICD-10-CM | POA: Diagnosis not present

## 2016-10-14 DIAGNOSIS — C55 Malignant neoplasm of uterus, part unspecified: Secondary | ICD-10-CM | POA: Diagnosis not present

## 2016-10-14 DIAGNOSIS — C3411 Malignant neoplasm of upper lobe, right bronchus or lung: Secondary | ICD-10-CM | POA: Diagnosis present

## 2016-10-14 DIAGNOSIS — F17210 Nicotine dependence, cigarettes, uncomplicated: Secondary | ICD-10-CM | POA: Diagnosis present

## 2016-10-14 DIAGNOSIS — Z8042 Family history of malignant neoplasm of prostate: Secondary | ICD-10-CM

## 2016-10-14 DIAGNOSIS — J439 Emphysema, unspecified: Secondary | ICD-10-CM | POA: Diagnosis present

## 2016-10-14 DIAGNOSIS — Y93E1 Activity, personal bathing and showering: Secondary | ICD-10-CM

## 2016-10-14 DIAGNOSIS — G40909 Epilepsy, unspecified, not intractable, without status epilepticus: Secondary | ICD-10-CM | POA: Diagnosis present

## 2016-10-14 DIAGNOSIS — N6489 Other specified disorders of breast: Secondary | ICD-10-CM | POA: Diagnosis present

## 2016-10-14 DIAGNOSIS — Z90710 Acquired absence of both cervix and uterus: Secondary | ICD-10-CM

## 2016-10-14 DIAGNOSIS — Z803 Family history of malignant neoplasm of breast: Secondary | ICD-10-CM

## 2016-10-14 DIAGNOSIS — F0280 Dementia in other diseases classified elsewhere without behavioral disturbance: Secondary | ICD-10-CM | POA: Diagnosis present

## 2016-10-14 DIAGNOSIS — C3401 Malignant neoplasm of right main bronchus: Secondary | ICD-10-CM | POA: Diagnosis not present

## 2016-10-14 DIAGNOSIS — K869 Disease of pancreas, unspecified: Secondary | ICD-10-CM | POA: Diagnosis not present

## 2016-10-14 DIAGNOSIS — Z7189 Other specified counseling: Secondary | ICD-10-CM | POA: Diagnosis not present

## 2016-10-14 DIAGNOSIS — G9340 Encephalopathy, unspecified: Secondary | ICD-10-CM | POA: Diagnosis present

## 2016-10-14 DIAGNOSIS — Z880 Allergy status to penicillin: Secondary | ICD-10-CM

## 2016-10-14 DIAGNOSIS — R55 Syncope and collapse: Secondary | ICD-10-CM | POA: Diagnosis not present

## 2016-10-14 DIAGNOSIS — C7931 Secondary malignant neoplasm of brain: Secondary | ICD-10-CM | POA: Diagnosis present

## 2016-10-14 DIAGNOSIS — S2231XA Fracture of one rib, right side, initial encounter for closed fracture: Secondary | ICD-10-CM | POA: Diagnosis not present

## 2016-10-14 DIAGNOSIS — C3490 Malignant neoplasm of unspecified part of unspecified bronchus or lung: Secondary | ICD-10-CM | POA: Diagnosis not present

## 2016-10-14 DIAGNOSIS — C252 Malignant neoplasm of tail of pancreas: Secondary | ICD-10-CM | POA: Diagnosis present

## 2016-10-14 DIAGNOSIS — R410 Disorientation, unspecified: Secondary | ICD-10-CM

## 2016-10-14 DIAGNOSIS — F0390 Unspecified dementia without behavioral disturbance: Secondary | ICD-10-CM | POA: Diagnosis not present

## 2016-10-14 DIAGNOSIS — I2510 Atherosclerotic heart disease of native coronary artery without angina pectoris: Secondary | ICD-10-CM | POA: Diagnosis present

## 2016-10-14 DIAGNOSIS — R590 Localized enlarged lymph nodes: Secondary | ICD-10-CM | POA: Diagnosis present

## 2016-10-14 DIAGNOSIS — C799 Secondary malignant neoplasm of unspecified site: Secondary | ICD-10-CM

## 2016-10-14 DIAGNOSIS — Z66 Do not resuscitate: Secondary | ICD-10-CM | POA: Diagnosis present

## 2016-10-14 DIAGNOSIS — Z993 Dependence on wheelchair: Secondary | ICD-10-CM

## 2016-10-14 DIAGNOSIS — Z9181 History of falling: Secondary | ICD-10-CM | POA: Diagnosis not present

## 2016-10-14 DIAGNOSIS — Z515 Encounter for palliative care: Secondary | ICD-10-CM

## 2016-10-14 DIAGNOSIS — Z510 Encounter for antineoplastic radiation therapy: Secondary | ICD-10-CM | POA: Diagnosis present

## 2016-10-14 DIAGNOSIS — R42 Dizziness and giddiness: Secondary | ICD-10-CM | POA: Diagnosis not present

## 2016-10-14 DIAGNOSIS — I700 Atherosclerosis of aorta: Secondary | ICD-10-CM | POA: Diagnosis present

## 2016-10-14 DIAGNOSIS — R296 Repeated falls: Secondary | ICD-10-CM

## 2016-10-14 DIAGNOSIS — N2889 Other specified disorders of kidney and ureter: Secondary | ICD-10-CM | POA: Diagnosis present

## 2016-10-14 DIAGNOSIS — E43 Unspecified severe protein-calorie malnutrition: Secondary | ICD-10-CM | POA: Diagnosis present

## 2016-10-14 DIAGNOSIS — N289 Disorder of kidney and ureter, unspecified: Secondary | ICD-10-CM | POA: Diagnosis not present

## 2016-10-14 DIAGNOSIS — Z79899 Other long term (current) drug therapy: Secondary | ICD-10-CM | POA: Diagnosis not present

## 2016-10-14 LAB — COMPREHENSIVE METABOLIC PANEL
ALBUMIN: 3.7 g/dL (ref 3.5–5.0)
ALK PHOS: 64 U/L (ref 38–126)
ALT: 12 U/L — ABNORMAL LOW (ref 14–54)
ANION GAP: 9 (ref 5–15)
AST: 33 U/L (ref 15–41)
BILIRUBIN TOTAL: 0.3 mg/dL (ref 0.3–1.2)
BUN: 13 mg/dL (ref 6–20)
CALCIUM: 8.4 mg/dL — AB (ref 8.9–10.3)
CO2: 28 mmol/L (ref 22–32)
Chloride: 103 mmol/L (ref 101–111)
Creatinine, Ser: 0.45 mg/dL (ref 0.44–1.00)
GFR calc non Af Amer: >60 mL/min (ref >60)
Glucose, Bld: 88 mg/dL (ref 65–99)
POTASSIUM: 3.7 mmol/L (ref 3.5–5.1)
SODIUM: 140 mmol/L (ref 135–145)
TOTAL PROTEIN: 6.6 g/dL (ref 6.5–8.1)

## 2016-10-14 LAB — CBC WITH DIFFERENTIAL/PLATELET
BASOS ABS: 0.1 10*3/uL (ref 0–0.1)
Basophils Relative: 1 %
EOS ABS: 0.3 10*3/uL (ref 0–0.7)
Eosinophils Relative: 2 %
HEMATOCRIT: 43 % (ref 35.0–47.0)
HEMOGLOBIN: 14.2 g/dL (ref 12.0–16.0)
Lymphocytes Relative: 9 %
Lymphs Abs: 1.1 10*3/uL (ref 1.0–3.6)
MCH: 30.9 pg (ref 26.0–34.0)
MCHC: 33 g/dL (ref 32.0–36.0)
MCV: 93.9 fL (ref 80.0–100.0)
Monocytes Absolute: 0.6 10*3/uL (ref 0.2–0.9)
Monocytes Relative: 5 %
NEUTROS ABS: 10.8 10*3/uL — AB (ref 1.4–6.5)
NEUTROS PCT: 83 %
Platelets: 230 10*3/uL (ref 150–440)
RBC: 4.58 MIL/uL (ref 3.80–5.20)
RDW: 13.4 % (ref 11.5–14.5)
WBC: 12.8 10*3/uL — AB (ref 3.6–11.0)

## 2016-10-14 LAB — URINALYSIS, COMPLETE (UACMP) WITH MICROSCOPIC
BACTERIA UA: NONE SEEN
BILIRUBIN URINE: NEGATIVE
Glucose, UA: 50 mg/dL — AB
Hgb urine dipstick: NEGATIVE
KETONES UR: 20 mg/dL — AB
Leukocytes, UA: NEGATIVE
NITRITE: NEGATIVE
PROTEIN: NEGATIVE mg/dL
RBC / HPF: NONE SEEN RBC/hpf (ref 0–5)
SQUAMOUS EPITHELIAL / LPF: NONE SEEN
Specific Gravity, Urine: 1.021 (ref 1.005–1.030)
pH: 5 (ref 5.0–8.0)

## 2016-10-14 LAB — LIPASE, BLOOD: Lipase: 28 U/L (ref 11–51)

## 2016-10-14 LAB — MRSA PCR SCREENING: MRSA BY PCR: POSITIVE — AB

## 2016-10-14 MED ORDER — LEVETIRACETAM 500 MG PO TABS
500.0000 mg | ORAL_TABLET | Freq: Three times a day (TID) | ORAL | Status: DC
  Administered 2016-10-14 – 2016-10-18 (×12): 500 mg via ORAL
  Filled 2016-10-14 (×14): qty 1

## 2016-10-14 MED ORDER — ACETAMINOPHEN 325 MG PO TABS
650.0000 mg | ORAL_TABLET | Freq: Four times a day (QID) | ORAL | Status: DC | PRN
  Administered 2016-10-14 – 2016-10-17 (×4): 650 mg via ORAL
  Filled 2016-10-14 (×4): qty 2

## 2016-10-14 MED ORDER — ENSURE ENLIVE PO LIQD
237.0000 mL | Freq: Three times a day (TID) | ORAL | Status: DC
  Administered 2016-10-15 – 2016-10-18 (×11): 237 mL via ORAL

## 2016-10-14 MED ORDER — HEPARIN SODIUM (PORCINE) 5000 UNIT/ML IJ SOLN
5000.0000 [IU] | Freq: Three times a day (TID) | INTRAMUSCULAR | Status: DC
  Administered 2016-10-14 – 2016-10-18 (×11): 5000 [IU] via SUBCUTANEOUS
  Filled 2016-10-14 (×11): qty 1

## 2016-10-14 MED ORDER — DEXAMETHASONE SODIUM PHOSPHATE 10 MG/ML IJ SOLN
10.0000 mg | Freq: Once | INTRAMUSCULAR | Status: AC
  Administered 2016-10-14: 10 mg via INTRAVENOUS
  Filled 2016-10-14: qty 1

## 2016-10-14 MED ORDER — MUPIROCIN 2 % EX OINT
1.0000 "application " | TOPICAL_OINTMENT | Freq: Two times a day (BID) | CUTANEOUS | Status: DC
  Administered 2016-10-15 – 2016-10-18 (×8): 1 via NASAL
  Filled 2016-10-14: qty 22

## 2016-10-14 MED ORDER — VITAMIN D (ERGOCALCIFEROL) 1.25 MG (50000 UNIT) PO CAPS
50000.0000 [IU] | ORAL_CAPSULE | ORAL | Status: DC
  Administered 2016-10-14: 50000 [IU] via ORAL
  Filled 2016-10-14: qty 1

## 2016-10-14 MED ORDER — SODIUM CHLORIDE 0.9 % IV SOLN: 250.0000 mL | INTRAVENOUS | Status: DC | PRN

## 2016-10-14 MED ORDER — MEMANTINE HCL 5 MG PO TABS
10.0000 mg | ORAL_TABLET | Freq: Two times a day (BID) | ORAL | Status: DC
  Administered 2016-10-14 – 2016-10-18 (×8): 10 mg via ORAL
  Filled 2016-10-14 (×8): qty 2

## 2016-10-14 MED ORDER — ARIPIPRAZOLE 2 MG PO TABS
1.0000 mg | ORAL_TABLET | Freq: Every day | ORAL | Status: DC
  Administered 2016-10-14 – 2016-10-18 (×5): 1 mg via ORAL
  Filled 2016-10-14 (×5): qty 1

## 2016-10-14 MED ORDER — DEXAMETHASONE SODIUM PHOSPHATE 10 MG/ML IJ SOLN
10.0000 mg | Freq: Four times a day (QID) | INTRAMUSCULAR | Status: DC
  Administered 2016-10-15 – 2016-10-17 (×13): 10 mg via INTRAVENOUS
  Filled 2016-10-14 (×13): qty 1

## 2016-10-14 MED ORDER — MIRTAZAPINE 15 MG PO TABS
15.0000 mg | ORAL_TABLET | Freq: Every day | ORAL | Status: DC
  Administered 2016-10-14 – 2016-10-17 (×4): 15 mg via ORAL
  Filled 2016-10-14 (×4): qty 1

## 2016-10-14 MED ORDER — ACETAMINOPHEN 650 MG RE SUPP: 650.0000 mg | Freq: Four times a day (QID) | RECTAL | Status: DC | PRN

## 2016-10-14 MED ORDER — PROMETHAZINE HCL 25 MG PO TABS
25.0000 mg | ORAL_TABLET | Freq: Four times a day (QID) | ORAL | Status: DC | PRN
  Filled 2016-10-14: qty 1

## 2016-10-14 MED ORDER — SODIUM CHLORIDE 0.9% FLUSH
3.0000 mL | Freq: Two times a day (BID) | INTRAVENOUS | Status: DC
  Administered 2016-10-14 – 2016-10-18 (×8): 3 mL via INTRAVENOUS

## 2016-10-14 MED ORDER — DOCUSATE SODIUM 100 MG PO CAPS
200.0000 mg | ORAL_CAPSULE | Freq: Two times a day (BID) | ORAL | Status: DC
  Administered 2016-10-14 – 2016-10-18 (×8): 200 mg via ORAL
  Filled 2016-10-14 (×8): qty 2

## 2016-10-14 MED ORDER — ALBUTEROL SULFATE (2.5 MG/3ML) 0.083% IN NEBU: 2.5000 mg | INHALATION_SOLUTION | Freq: Four times a day (QID) | RESPIRATORY_TRACT | Status: DC | PRN

## 2016-10-14 MED ORDER — CHLORHEXIDINE GLUCONATE CLOTH 2 % EX PADS
6.0000 | MEDICATED_PAD | Freq: Every day | CUTANEOUS | Status: DC
  Administered 2016-10-15 – 2016-10-18 (×4): 6 via TOPICAL

## 2016-10-14 MED ORDER — SODIUM CHLORIDE 0.9% FLUSH
3.0000 mL | INTRAVENOUS | Status: DC | PRN
  Administered 2016-10-15 – 2016-10-16 (×4): 3 mL via INTRAVENOUS
  Filled 2016-10-14 (×4): qty 3

## 2016-10-14 MED ORDER — SERTRALINE HCL 50 MG PO TABS
100.0000 mg | ORAL_TABLET | Freq: Every day | ORAL | Status: DC
  Administered 2016-10-14 – 2016-10-18 (×5): 100 mg via ORAL
  Filled 2016-10-14 (×5): qty 2

## 2016-10-14 NOTE — ED Notes (Signed)
[address] assisted living [place]  [phone]

## 2016-10-14 NOTE — ED Triage Notes (Signed)
Pt comes into the ED via [facility] from [facility] assisted living facility where she had an unwitnessed fall in the shower.  Denies hitting her head, LOC, or neck pain.  Patient states she has right sided rib cage pain.  Patient has h/o dementia but is at baseline.  Patient alert and oriented at this time and in NAD with even and unlabored respirations.  Denies any blood thinners.

## 2016-10-14 NOTE — ED Notes (Signed)
Unsuccessful IV start LFA, getting [place]

## 2016-10-14 NOTE — ED Notes (Signed)
Pt transport to [facility]

## 2016-10-14 NOTE — ED Provider Notes (Addendum)
[facility] Emergency Department Provider Note  ____________________________________________  Time seen: Approximately 7:07 PM  I have reviewed the triage vital signs and the nursing notes.   HISTORY  Chief Complaint Fall  Level 5 Caveat: Portions of the History and Physical are unable to be obtained due to patient being a poor historian   HPI Traci Holmes is a 71 y.o. female sent to the ED for an unwitnessed fall while in the shower. Patient states she fell onto her right chest wall and has pain in that area. Denies trouble breathing. Pain is nonradiating, severe, worse with movement and pressure on the area, no alleviating factors. Denies head injury or loss of consciousness or any neck pain. Is also reports she had a fall yesterday which she does not remember the circumstances of.     Past Medical History:  Diagnosis Date  . Alzheimer disease   . Aphasia   . Cancer [facility])    Uterine Cancer-30 years ago with hysterectomy.  . Dementia   . Hyperlipemia   . Hypocalcemia   . Major neurocognitive disorder, unspecified 08/25/2014  . Seizures [facility])      Patient Active Problem List   Diagnosis Date Noted  . Encephalopathy 10/14/2016  . Altered mental status   . Elevated troponin   . Seizure ([place])   . Bradycardia   . Demand ischemia ([place])   . Protein-calorie malnutrition, severe 02/09/2015  . Status epilepticus ([place]) 02/08/2015  . Dementia, old age   . Major neurocognitive disorder, unspecified 08/25/2014     Past Surgical History:  Procedure Laterality Date  . ABDOMINAL HYSTERECTOMY     Does not remember date 30-40 years ago?     Prior to Admission medications   Medication Sig Start Date End Date Taking? Authorizing Provider  albuterol (PROVENTIL HFA;VENTOLIN HFA) 108 (90 Base) MCG/ACT inhaler Inhale 2 puffs into the lungs every 6 (six) hours as needed for wheezing or shortness of breath. 10/03/16   Darel Hong, MD  ARIPiprazole  (ABILIFY) 2 MG tablet Take 1 mg by mouth daily.    [provider]  Cholecalciferol (VITAMIN D3) 50000 units CAPS Take by mouth.    [provider]  docusate sodium (COLACE) 100 MG capsule Take 2 capsules (200 mg total) by mouth 2 (two) times daily. 02/14/15   Dustin Flock, MD  feeding supplement, ENSURE ENLIVE, (ENSURE ENLIVE) LIQD Take 237 mLs by mouth 3 (three) times daily. 02/14/15   Dustin Flock, MD  fluticasone (FLONASE) 50 MCG/ACT nasal spray Place 1 spray into both nostrils 2 (two) times daily.    [provider]  levETIRAcetam (KEPPRA) 500 MG tablet Take 1 tablet (500 mg total) by mouth 2 (two) times daily. Patient taking differently: Take 500 mg by mouth 3 (three) times daily.  02/14/15   Dustin Flock, MD  memantine (NAMENDA) 10 MG tablet Take 10 mg by mouth 2 (two) times daily.    [provider]  mirtazapine (REMERON) 15 MG tablet Take 15 mg by mouth at bedtime.    [provider]  promethazine (PHENERGAN) 25 MG tablet Take 25 mg by mouth every 6 (six) hours as needed for nausea or vomiting.    [provider]  sertraline (ZOLOFT) 100 MG tablet Take 100 mg by mouth daily.    [provider]  Spacer/Aero Chamber Mouthpiece MISC 1 Units by Does not apply route every 4 (four) hours as needed (wheezing). 10/03/16   Darel Hong, MD  Vitamin D, Ergocalciferol, (  DRISDOL) 50000 units CAPS capsule Take 50,000 Units by mouth every 7 (seven) days.    [provider]     Allergies Penicillins   Family History  Problem Relation Age of Onset  . Cancer Mother        breast  . Cancer Father        prostate    Social History Social History  Substance Use Topics  . Smoking status: Current Every Day Smoker    Packs/day: 1.00    Types: Cigarettes  . Smokeless tobacco: Never Used  . Alcohol use No    Review of Systems  Constitutional:   No fever or chills.  ENT:   No sore throat. No  rhinorrhea. Cardiovascular:   Positive chest wall pain as above. No syncope.Marland Kitchen Respiratory:   No dyspnea or cough. Gastrointestinal:   Negative for abdominal pain, vomiting and diarrhea.  Musculoskeletal:   Negative for focal pain or swelling All other systems reviewed and are negative except as documented above in ROS and HPI.  ____________________________________________   PHYSICAL EXAM:  VITAL SIGNS: ED Triage Vitals  Enc Vitals Group     BP 10/14/16 1514 (!) 124/52     Pulse Rate 10/14/16 1514 (!) 52     Resp 10/14/16 1514 18     Temp 10/14/16 1514 97.8 F (36.6 C)     Temp Source 10/14/16 1514 Oral     SpO2 10/14/16 1514 93 %     Weight 10/14/16 1510 88 lb 9.6 oz (40.2 kg)     Height 10/14/16 1510 5' (1.524 m)     Head Circumference --      Peak Flow --      Pain Score 10/14/16 1510 8     Pain Loc --      Pain Edu? --      Excl. in [place]? --     Vital signs reviewed, nursing assessments reviewed.   Constitutional:   Alert and orientedTo person and place. Well appearing and in no distress. Eyes:   No scleral icterus.  EOMI. No nystagmus. No conjunctival pallor. PERRL. ENT   Head:   Normocephalic and atraumatic.   Nose:   No congestion/rhinnorhea.    Mouth/Throat:   MMM, no pharyngeal erythema. No peritonsillar mass.    Neck:   No meningismus. Full ROM Hematological/Lymphatic/Immunilogical:   No cervical lymphadenopathy. Cardiovascular:   RRR. Symmetric bilateral radial and DP pulses.  No murmurs.  Respiratory:   Normal respiratory effort without tachypnea/retractions. Breath sounds are clear and equal bilaterally. No wheezes/rales/rhonchi. Gastrointestinal:   Soft and nontender. Non distended. There is no CVA tenderness.  No rebound, rigidity, or guarding. Genitourinary:   deferred Musculoskeletal:   Normal range of motion in all extremities. No joint effusions.  No lower extremity tenderness.  No edema. Right chest wall tender lateral inferior ribs. No  instability Neurologic:   Normal speech and language. Patient joking and vulgar. Motor grossly intact. Steady gait No gross focal neurologic deficits are appreciated.  Skin:    Skin is warm, dry and intact. No rash noted.  No petechiae, purpura, or bullae.  ____________________________________________    LABS (pertinent positives/negatives) (all labs ordered are listed, but only abnormal results are displayed) Labs Reviewed  COMPREHENSIVE METABOLIC PANEL - Abnormal; Notable for the following:       Result Value   Calcium 8.4 (*)    ALT 12 (*)    All other components within normal limits  CBC WITH DIFFERENTIAL/PLATELET - Abnormal;  Notable for the following:    WBC 12.8 (*)    Neutro Abs 10.8 (*)    All other components within normal limits  LIPASE, BLOOD  URINALYSIS, COMPLETE (UACMP) WITH MICROSCOPIC   ____________________________________________   EKG  Interpreted by me Sinus rhythm rate of 50, axis. Normal intervals. Poor R-wave progression in anterior precordial leads. Normal ST segments and T waves.  ____________________________________________    RADIOLOGY  Dg Chest 2 View  Result Date: 10/14/2016 CLINICAL DATA:  Right rib pain after falling in shower today. Initial encounter. History of uterine cancer. EXAM: CHEST  2 VIEW COMPARISON:  Chest CT 10/11/2016.  Radiographs 10/03/2016. FINDINGS: Right apical mass, mediastinal and hilar adenopathy again demonstrated. The heart size is stable. There is aortic atherosclerosis. There is a small right pleural effusion with mild right basilar atelectasis. No evidence of pneumothorax. Possible nondisplaced fracture of the right seventh rib laterally. IMPRESSION: Possible nondisplaced fracture of the right seventh rib with new small right pleural effusion and mild right basilar atelectasis. No evidence of pneumothorax. Known right apical mass, mediastinal and hilar adenopathy suspicious for lung cancer as seen on recent CT.  Electronically Signed   By: Richardean Sale M.D.   On: 10/14/2016 16:49   Ct Head Wo Contrast  Result Date: 10/14/2016 CLINICAL DATA:  Unwitnessed fall in the shower. EXAM: CT HEAD WITHOUT CONTRAST TECHNIQUE: Contiguous axial images were obtained from the base of the skull through the vertex without intravenous contrast. COMPARISON:  03/30/2015 FINDINGS: Brain: Areas of vasogenic edema are identified in the right frontal lobe, right occipital lobe, and left frontoparietal region. Given the chest lesion and lymphadenopathy identified in the chest on recent CT imaging, metastatic disease to the brain is highly likely. No evidence for acute hemorrhage, no hydrocephalus, no abnormal extra-axial fluid collection. 2 mm left-to-right midline shift evident. Vascular: No hyperdense vessel or unexpected calcification. Skull: No evidence for fracture. No worrisome lytic or sclerotic lesion. Sinuses/Orbits: The visualized paranasal sinuses and mastoid air cells are clear. Visualized portions of the globes and intraorbital fat are unremarkable. Other: None. IMPRESSION: Multiple areas of vasogenic edema in the brain with focal low-density lesions visible in the left frontal and left parietal lobes. Given the findings in the chest on CT scan of 10/11/2016 , features on today's head CT are highly suspicious for intracranial metastatic disease. Edema causes 2 mm left-to-right midline shift. MRI without and with contrast recommended to further evaluate. Electronically Signed   By: Misty Stanley M.D.   On: 10/14/2016 16:27    ____________________________________________   PROCEDURES Procedures  ____________________________________________   INITIAL IMPRESSION / ASSESSMENT AND PLAN / ED COURSE  Pertinent labs & imaging results that were available during my care of the patient were reviewed by me and considered in my medical decision making (see chart for details).    Clinical Course as of Oct 14 1905  Fri Oct 14, 2016  1605 P/w 2 falls in 2 days. Suspected right rib fx, will get cxr to eval ribs and for poss ptx/htx. Also check CT head given falls to ensure no occult SDH.  [PS]    Clinical Course User Index [PS] Carrie Mew, MD    ----------------------------------------- 7:12 PM on 10/14/2016 -----------------------------------------  CT head reveals likely metastatic disease with vasogenic edema and 2 mm of shift. Review of records shows the patient has CT chest concerning for metastatic lung cancer. She recently had a biopsy 2 days ago, pathology results are not available yet. This warrants Decadron  check labs, hospitalization for further management of cerebral edema and evaluation. Discussed with Dr. Mike Gip of oncology who agrees without any further acute recommendations. Labs are unremarkable.  Chest x-ray does show a likely seventh rib fracture that is nondisplaced. There may be a small effusion on that side, don't think she has a significant hemothorax that would require a chest tube.   ____________________________________________   FINAL CLINICAL IMPRESSION(S) / ED DIAGNOSES  Final diagnoses:  Metastatic cancer ([place])  Vasogenic cerebral edema ([place])  Multiple falls  Confusion      New Prescriptions   No medications on file     Portions of this note were generated with dragon dictation software. Dictation errors may occur despite best attempts at proofreading.    Carrie Mew, MD 10/14/16 Docia Chuck    Carrie Mew, MD 10/14/16 2010

## 2016-10-14 NOTE — Progress Notes (Signed)
LCSW consulted with EDP and he is awaiting additional labs before deciding if he will admit patient. LCSW consult will be put in after if required.  Talyn Dessert LCSW

## 2016-10-14 NOTE — H&P (Signed)
Traci Holmes is an 71 y.o. female.   Chief Complaint: Fall HPI: This is a 71 year old female who recently had a right upper lobe lung mass discovered. She underwent lymph node biopsy on the 22nd at Dr. Angie Fava office. She lives in a care home. Today she was found passed out in the shower. She was very confused more than usual afterwards. Presents to the ER here and she still confused. She does remember getting a biopsy or any diagnosis. I did speak with her son by phone. He confirms that she does have dementia and is confused at baseline.  Past Medical History:  Diagnosis Date  . Alzheimer disease   . Aphasia   . Cancer [facility])    Uterine Cancer-30 years ago with hysterectomy.  . Dementia   . Hyperlipemia   . Hypocalcemia   . Major neurocognitive disorder, unspecified 08/25/2014  . Seizures ([place])     Past Surgical History:  Procedure Laterality Date  . ABDOMINAL HYSTERECTOMY     Does not remember date 30-40 years ago?    Family History  Problem Relation Age of Onset  . Cancer Mother        breast  . Cancer Father        prostate   Social History:  reports that she has been smoking Cigarettes.  She has been smoking about 1.00 pack per day. She has never used smokeless tobacco. She reports that she does not drink alcohol or use drugs.  Allergies:  Allergies  Allergen Reactions  . Penicillins Other (See Comments)    Reaction: unknown  Patient cannot answer follow-up questions     (Not in a hospital admission)  No results found for this or any previous visit (from the past 48 hour(s)). Dg Chest 2 View  Result Date: 10/14/2016 CLINICAL DATA:  Right rib pain after falling in shower today. Initial encounter. History of uterine cancer. EXAM: CHEST  2 VIEW COMPARISON:  Chest CT 10/11/2016.  Radiographs 10/03/2016. FINDINGS: Right apical mass, mediastinal and hilar adenopathy again demonstrated. The heart size is stable. There is aortic atherosclerosis. There is a small right  pleural effusion with mild right basilar atelectasis. No evidence of pneumothorax. Possible nondisplaced fracture of the right seventh rib laterally. IMPRESSION: Possible nondisplaced fracture of the right seventh rib with new small right pleural effusion and mild right basilar atelectasis. No evidence of pneumothorax. Known right apical mass, mediastinal and hilar adenopathy suspicious for lung cancer as seen on recent CT. Electronically Signed   By: Richardean Sale M.D.   On: 10/14/2016 16:49   Ct Head Wo Contrast  Result Date: 10/14/2016 CLINICAL DATA:  Unwitnessed fall in the shower. EXAM: CT HEAD WITHOUT CONTRAST TECHNIQUE: Contiguous axial images were obtained from the base of the skull through the vertex without intravenous contrast. COMPARISON:  03/30/2015 FINDINGS: Brain: Areas of vasogenic edema are identified in the right frontal lobe, right occipital lobe, and left frontoparietal region. Given the chest lesion and lymphadenopathy identified in the chest on recent CT imaging, metastatic disease to the brain is highly likely. No evidence for acute hemorrhage, no hydrocephalus, no abnormal extra-axial fluid collection. 2 mm left-to-right midline shift evident. Vascular: No hyperdense vessel or unexpected calcification. Skull: No evidence for fracture. No worrisome lytic or sclerotic lesion. Sinuses/Orbits: The visualized paranasal sinuses and mastoid air cells are clear. Visualized portions of the globes and intraorbital fat are unremarkable. Other: None. IMPRESSION: Multiple areas of vasogenic edema in the brain with focal low-density lesions visible in  the left frontal and left parietal lobes. Given the findings in the chest on CT scan of 10/11/2016 , features on today's head CT are highly suspicious for intracranial metastatic disease. Edema causes 2 mm left-to-right midline shift. MRI without and with contrast recommended to further evaluate. Electronically Signed   By: Misty Stanley M.D.   On:  10/14/2016 16:27    Review of Systems  Unable to perform ROS: Dementia    Blood pressure (!) 139/55, pulse (!) 52, temperature 97.8 F (36.6 C), temperature source Oral, resp. rate (!) 21, height 5' (1.524 m), weight 40.2 kg (88 lb 9.6 oz), SpO2 (!) 88 %. Physical Exam  Constitutional:  Cachectic looking white female who is confused.  HENT:  Head: Normocephalic and atraumatic.  Mouth/Throat: Oropharynx is clear and moist. No oropharyngeal exudate.  Eyes: Pupils are equal, round, and reactive to light. EOM are normal. No scleral icterus.  Neck: Neck supple. No JVD present. No tracheal deviation present. No thyromegaly present.  Cardiovascular: Normal rate and regular rhythm.   No murmur heard. Respiratory: Effort normal and breath sounds normal.  GI: Soft. Bowel sounds are normal. She exhibits no mass.  Musculoskeletal: She exhibits no edema or tenderness.  Lymphadenopathy:    She has no cervical adenopathy.  Neurological: She is alert. No cranial nerve deficit.  Skin: Skin is warm and dry.     Assessment/Plan 1. Acute encephalopathy. She is more confused than before. Not sure if she fell in the shower or if she passed out or had a seizure. CT scan of the brain here in the ER shows possible metastatic masses. We'll get MRI with and without contrast to further evaluate this. We'll go ahead and start her on IV Decadron. 2. Lung mass. She did have a lymph node biopsy on the 22nd and results are still not back yet. We'll go ahead and consult oncology to evaluate for further treatment. 3. Seizure disorder. She is on Keppra will continue current dose. 4. Dementia. She appears to be more confused than at baseline. Will assess this further when the son can be here in person. 5. COPD. We'll continue her inhalers. No sign of exacerbation 6. Severe protein calorie malnutrition. Will put her on supplements.  Total time spent 45 minutes  Baxter Hire, MD 10/14/2016, 6:14 PM

## 2016-10-15 ENCOUNTER — Inpatient Hospital Stay: Payer: Medicare Other

## 2016-10-15 DIAGNOSIS — D7389 Other diseases of spleen: Secondary | ICD-10-CM

## 2016-10-15 DIAGNOSIS — C3402 Malignant neoplasm of left main bronchus: Secondary | ICD-10-CM

## 2016-10-15 DIAGNOSIS — K769 Liver disease, unspecified: Secondary | ICD-10-CM

## 2016-10-15 DIAGNOSIS — C3401 Malignant neoplasm of right main bronchus: Secondary | ICD-10-CM

## 2016-10-15 DIAGNOSIS — N289 Disorder of kidney and ureter, unspecified: Secondary | ICD-10-CM

## 2016-10-15 DIAGNOSIS — Z9181 History of falling: Secondary | ICD-10-CM

## 2016-10-15 DIAGNOSIS — R42 Dizziness and giddiness: Secondary | ICD-10-CM

## 2016-10-15 DIAGNOSIS — F17210 Nicotine dependence, cigarettes, uncomplicated: Secondary | ICD-10-CM

## 2016-10-15 DIAGNOSIS — C770 Secondary and unspecified malignant neoplasm of lymph nodes of head, face and neck: Secondary | ICD-10-CM

## 2016-10-15 LAB — BASIC METABOLIC PANEL
Anion gap: 8 (ref 5–15)
BUN: 14 mg/dL (ref 6–20)
CALCIUM: 9.4 mg/dL (ref 8.9–10.3)
CO2: 27 mmol/L (ref 22–32)
CREATININE: 0.44 mg/dL (ref 0.44–1.00)
Chloride: 101 mmol/L (ref 101–111)
GFR calc Af Amer: >60 mL/min (ref >60)
GLUCOSE: 161 mg/dL — AB (ref 65–99)
Potassium: 4.2 mmol/L (ref 3.5–5.1)
SODIUM: 136 mmol/L (ref 135–145)

## 2016-10-15 MED ORDER — HYDROCODONE-ACETAMINOPHEN 5-325 MG PO TABS
1.0000 | ORAL_TABLET | Freq: Four times a day (QID) | ORAL | Status: DC | PRN
  Administered 2016-10-17: 11:00:00 1 via ORAL
  Filled 2016-10-15: qty 1

## 2016-10-15 MED ORDER — GADOBENATE DIMEGLUMINE 529 MG/ML IV SOLN
7.0000 mL | Freq: Once | INTRAVENOUS | Status: AC | PRN
  Administered 2016-10-15: 12:00:00 7 mL via INTRAVENOUS

## 2016-10-15 NOTE — Consult Note (Signed)
Traci Holmes  Date of admission:  10/14/2016  Inpatient day:  10/15/2016  Consulting physician:  Dr. Harrel Lemon.   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  Chief Complaint: Traci Holmes is a 71 y.o. female with a > 50 pack year smoking history who was admitted through the emergency room after a syncopal event and head CT findings worrisome for multiple brain metastasis.  HPI:  The patient lives in [facility] assisted living.  She has dementia, but is able to take care of all of her activities of daily living.  She is unclear of recent events except for slipping and falling in the shower which prompted her ER evaluation.  She has a recent history of bilateral adenopathy.  She was seen in the [facility] ER on 10/03/2016 with generalized weakness and fatigue.  Chest x-ray revealed hyperinflation with emphysematous changes and a right apical asymmetric opacity which a chest CT was recommended.  He was treated with azithromycin, prednisone and albuterol.  Patient was seen by Dr. Jamal Collin on 10/04/2016 for adenopathy. He was noted to have mid and upper anterior cervical nodes bilaterally and (5 mm - 2 cm).  She was referred for additional imaging.  Neck CT on 10/11/2016 revealed bilateral cervical adenopathy consistent with metastatic disease.  There was a 1.8 x 1.9 cm left level II lymph node. There were bilateral cm size level II, left level III, and bilateral level IV adenopathy. 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   Chest CT on 10/11/2016 revealed a 3.6 cm posterior apical right upper lobe mass compatible with primary bronchogenic carcinoma. There was a new 1.3 cm central right upper lobe and 0.5 cm anterior right lower lobe pulmonary nodule. There was bilateral hilar, bilateral mediastinal, bilateral supraclavicular, and porta hepatis adenopathy. There was a new left  adrenal nodule. There was a 4.4 cm partially visualized pancreatic tail cystic mass, increased in size, indeterminate for cystic pancreatic malignancy.  There were indeterminate small bilateral low attenuation renal masses and a new small low-attenuation splenic lesion. There was focal nodular asymmetry in the deep upper-outer right breast.  She has left main and three-vessel coronary artery disease.  She underwent left upper cervical lymph node core biopsy or ultrasound guidance on 10/12/2016 by Dr. Jamal Collin.  She presented to the ER after falling in the shower.  She had noted some dizziness. She has baseline dementia.  She denies any other symptoms except for right sided rib pain following her fall.  CXR on 10/14/2016 revealed possible nondisplaced fracture of the right seventh rib with new small right pleural effusion and mild right basilar atelectasis.    Head CT without contrast on 10/14/2016 revealed multiple areas of vasogenic edema in the brain with focal low-density lesions visible in the left frontal and left parietal lobes. Edema causes 2 mm left-to-right midline shift.  Head MRI on 10/15/2016 revealed multiple metastatic lesions throughout the brain. Mild LEFT-to-RIGHT shift of 1-2 mm. The multiplicity and size of BILATERAL cerebellar lesions puts the patient at risk for obstructive hydrocephalus.  She was started on Decadron and Keppra.  She denies any systemic complaints.   Past Medical History:  Diagnosis Date  . Alzheimer disease   . Aphasia   . Cancer [facility])    Uterine Cancer-30 years ago with hysterectomy.  . Dementia   . Hyperlipemia   . Hypocalcemia   .  Major neurocognitive disorder, unspecified 08/25/2014  . Seizures ([place])     Past Surgical History:  Procedure Laterality Date  . ABDOMINAL HYSTERECTOMY     Does not remember date 30-40 years ago?    Family History  Problem Relation Age of Onset  . Cancer Mother        breast  . Cancer Father        prostate     Social History:  reports that she has been smoking Cigarettes.  She has been smoking about 1.00 pack per day. She has never used smokeless tobacco. She reports that she does not drink alcohol or use drugs.  She lives at [facility] assisted [facility].  She has dementia. She  has 2 sons Traci Holmes lives in [place]).  She states that she wishes for Traci Holmes to be her medical power of attorney (no paperwork completed to-date). She also states her niece is "power of attorney", but does not wish her to make medical decisions for her if she cannot speak for herself.  Allergies:  Allergies  Allergen Reactions  . Penicillins Other (See Comments)    Reaction: unknown  Patient cannot answer follow-up questions    Medications Prior to Admission  Medication Sig Dispense Refill  . acetaminophen (TYLENOL) 500 MG tablet Take 500 mg by mouth every 6 (six) hours as needed for fever or headache.     . albuterol (PROVENTIL HFA;VENTOLIN HFA) 108 (90 Base) MCG/ACT inhaler Inhale 2 puffs into the lungs every 6 (six) hours as needed for wheezing or shortness of breath. 1 Inhaler 0  . ARIPiprazole (ABILIFY) 2 MG tablet Take 1 mg by mouth daily.    . Cholecalciferol (VITAMIN D3) 50000 units CAPS Take by mouth.    . docusate sodium (COLACE) 100 MG capsule Take 2 capsules (200 mg total) by mouth 2 (two) times daily. 10 capsule 0  . fluticasone (FLONASE) 50 MCG/ACT nasal spray Place 1 spray into both nostrils 2 (two) times daily.    Marland Kitchen lactose free nutrition (BOOST) LIQD Take 237 mLs by mouth 3 (three) times daily between meals.    . levETIRAcetam (KEPPRA) 500 MG tablet Take 1 tablet (500 mg total) by mouth 2 (two) times daily. (Patient taking differently: Take 500 mg by mouth 3 (three) times daily. ) 10 tablet 0  . memantine (NAMENDA) 10 MG tablet Take 10 mg by mouth 2 (two) times daily.    . promethazine (PHENERGAN) 25 MG tablet Take 25 mg by mouth every 6 (six) hours as needed for nausea or vomiting.    . sertraline  (ZOLOFT) 100 MG tablet Take 100 mg by mouth daily.    Marland Kitchen Spacer/Aero Chamber Mouthpiece MISC 1 Units by Does not apply route every 4 (four) hours as needed (wheezing). 1 each 0  . Vitamin D, Ergocalciferol, (DRISDOL) 50000 units CAPS capsule Take 50,000 Units by mouth every 7 (seven) days.      Review of Systems: GENERAL:  Feels "ok".  No fevers, sweats or weight loss. PERFORMANCE STATUS (ECOG):  2 HEENT:  No visual changes, runny nose, sore throat, mouth sores or tenderness. Lungs: No shortness of breath or cough.  No hemoptysis. Cardiac:  No chest pain, palpitations, orthopnea, or PND. Breasts:  Denies any masses.  Unaware of last mammogram. GI:  No nausea, vomiting, diarrhea, constipation, melena or hematochezia.  Unaware of last colonoscopy. GU:  No urgency, frequency, dysuria, or hematuria. Musculoskeletal:  Right rib pain after fall (imaging reveals fracture of right 7th rib).  No  back pain.  No joint pain.  No muscle tenderness. Extremities:  No pain or swelling. Skin:  No rashes or skin changes. Neuro:  Dizziness. No headache, numbness or weakness, balance or coordination issues. Endocrine:  No diabetes, thyroid issues, hot flashes or night sweats. Psych:  No mood changes, depression or anxiety. Pain:  No focal pain. Review of systems:  All other systems reviewed and found to be negative.  Physical Exam:  Blood pressure (!) 118/28, pulse 67, temperature 97.8 F (36.6 C), temperature source Oral, resp. rate (!) 22, height 5' (1.524 m), weight 84 lb 8 oz (38.3 kg), SpO2 (!) 89 %.  GENERAL:  Thin pale woman sitting comfortably on the medical unit in no acute distress. MENTAL STATUS:  Alert and oriented to person, place and time. HEAD:  Long brown hair.  Normocephalic, atraumatic, face symmetric, no Cushingoid features. EYES:  Blue eyes.  Pupils equal round and reactive to light and accomodation.  No conjunctivitis or scleral icterus. ENT:  Oropharynx clear without lesion.  Tongue  normal. Mucous membranes moist.  RESPIRATORY:  Clear to auscultation without rales, wheezes or rhonchi. CARDIOVASCULAR:  Regular rate and rhythm without murmur, rub or gallop. ABDOMEN:  Soft, non-tender, with active bowel sounds, and no hepatosplenomegaly.  No masses. SKIN:  Pale.  No rashes, ulcers or lesions. EXTREMITIES: No edema, no skin discoloration or tenderness.  No palpable cords. LYMPH NODES: Bilateral palpable cervical adenopathy (right > left).  No supraclavicular, axillary or inguinal adenopathy  NEUROLOGICAL: Alert & oriented, cranial nerves II-XII intact; motor strength 5/5 throughout; sensation intact; gait not tested; 4 beats of clonus right ankle.  No Babinski.  PSYCH:  Appropriate.   Results for orders placed or performed during the hospital encounter of 10/14/16 (from the past 48 hour(s))  Comprehensive metabolic panel     Status: Abnormal   Collection Time: 10/14/16  5:15 PM  Result Value Ref Range   Sodium 140 135 - 145 mmol/L   Potassium 3.7 3.5 - 5.1 mmol/L   Chloride 103 101 - 111 mmol/L   CO2 28 22 - 32 mmol/L   Glucose, Bld 88 65 - 99 mg/dL   BUN 13 6 - 20 mg/dL   Creatinine, Ser 0.45 0.44 - 1.00 mg/dL   Calcium 8.4 (L) 8.9 - 10.3 mg/dL   Total Protein 6.6 6.5 - 8.1 g/dL   Albumin 3.7 3.5 - 5.0 g/dL   AST 33 15 - 41 U/L   ALT 12 (L) 14 - 54 U/L   Alkaline Phosphatase 64 38 - 126 U/L   Total Bilirubin 0.3 0.3 - 1.2 mg/dL   GFR calc non Af Amer >60 >60 mL/min   GFR calc Af Amer >60 >60 mL/min    Comment: (NOTE) The eGFR has been calculated using the CKD EPI equation. This calculation has not been validated in all clinical situations. eGFR's persistently <60 mL/min signify possible Chronic Kidney Disease.    Anion gap 9 5 - 15  Lipase, blood     Status: None   Collection Time: 10/14/16  5:15 PM  Result Value Ref Range   Lipase 28 11 - 51 U/L  CBC with Differential     Status: Abnormal   Collection Time: 10/14/16  5:15 PM  Result Value Ref Range    WBC 12.8 (H) 3.6 - 11.0 K/uL   RBC 4.58 3.80 - 5.20 MIL/uL   Hemoglobin 14.2 12.0 - 16.0 g/dL   HCT 43.0 35.0 - 47.0 %   MCV 93.9  80.0 - 100.0 fL   MCH 30.9 26.0 - 34.0 pg   MCHC 33.0 32.0 - 36.0 g/dL   RDW 13.4 11.5 - 14.5 %   Platelets 230 150 - 440 K/uL   Neutrophils Relative % 83 %   Neutro Abs 10.8 (H) 1.4 - 6.5 K/uL   Lymphocytes Relative 9 %   Lymphs Abs 1.1 1.0 - 3.6 K/uL   Monocytes Relative 5 %   Monocytes Absolute 0.6 0.2 - 0.9 K/uL   Eosinophils Relative 2 %   Eosinophils Absolute 0.3 0 - 0.7 K/uL   Basophils Relative 1 %   Basophils Absolute 0.1 0 - 0.1 K/uL  MRSA PCR Screening     Status: Abnormal   Collection Time: 10/14/16  7:34 PM  Result Value Ref Range   MRSA by PCR POSITIVE (A) NEGATIVE    Comment:        The GeneXpert MRSA Assay (FDA approved for NASAL specimens only), is one component of a comprehensive MRSA colonization surveillance program. It is not intended to diagnose MRSA infection nor to guide or monitor treatment for MRSA infections. RESULT CALLED TO, READ BACK BY AND VERIFIED WITH: JACKIE PAGE ON 10/14/16 AT 2317 QSD   Urinalysis, Complete w Microscopic     Status: Abnormal   Collection Time: 10/14/16  9:54 PM  Result Value Ref Range   Color, Urine YELLOW (A) YELLOW   APPearance CLEAR (A) CLEAR   Specific Gravity, Urine 1.021 1.005 - 1.030   pH 5.0 5.0 - 8.0   Glucose, UA 50 (A) NEGATIVE mg/dL   Hgb urine dipstick NEGATIVE NEGATIVE   Bilirubin Urine NEGATIVE NEGATIVE   Ketones, ur 20 (A) NEGATIVE mg/dL   Protein, ur NEGATIVE NEGATIVE mg/dL   Nitrite NEGATIVE NEGATIVE   Leukocytes, UA NEGATIVE NEGATIVE   RBC / HPF NONE SEEN 0 - 5 RBC/hpf   WBC, UA 0-5 0 - 5 WBC/hpf   Bacteria, UA NONE SEEN NONE SEEN   Squamous Epithelial / LPF NONE SEEN NONE SEEN   Mucus PRESENT   Basic metabolic panel     Status: Abnormal   Collection Time: 10/15/16  4:30 AM  Result Value Ref Range   Sodium 136 135 - 145 mmol/L   Potassium 4.2 3.5 - 5.1 mmol/L    Chloride 101 101 - 111 mmol/L   CO2 27 22 - 32 mmol/L   Glucose, Bld 161 (H) 65 - 99 mg/dL   BUN 14 6 - 20 mg/dL   Creatinine, Ser 0.44 0.44 - 1.00 mg/dL   Calcium 9.4 8.9 - 10.3 mg/dL   GFR calc non Af Amer >60 >60 mL/min   GFR calc Af Amer >60 >60 mL/min    Comment: (NOTE) The eGFR has been calculated using the CKD EPI equation. This calculation has not been validated in all clinical situations. eGFR's persistently <60 mL/min signify possible Chronic Kidney Disease.    Anion gap 8 5 - 15   Dg Chest 2 View  Result Date: 10/14/2016 CLINICAL DATA:  Right rib pain after falling in shower today. Initial encounter. History of uterine cancer. EXAM: CHEST  2 VIEW COMPARISON:  Chest CT 10/11/2016.  Radiographs 10/03/2016. FINDINGS: Right apical mass, mediastinal and hilar adenopathy again demonstrated. The heart size is stable. There is aortic atherosclerosis. There is a small right pleural effusion with mild right basilar atelectasis. No evidence of pneumothorax. Possible nondisplaced fracture of the right seventh rib laterally. IMPRESSION: Possible nondisplaced fracture of the right seventh rib with new  small right pleural effusion and mild right basilar atelectasis. No evidence of pneumothorax. Known right apical mass, mediastinal and hilar adenopathy suspicious for lung cancer as seen on recent CT. Electronically Signed   By: Richardean Sale M.D.   On: 10/14/2016 16:49   Ct Head Wo Contrast  Result Date: 10/14/2016 CLINICAL DATA:  Unwitnessed fall in the shower. EXAM: CT HEAD WITHOUT CONTRAST TECHNIQUE: Contiguous axial images were obtained from the base of the skull through the vertex without intravenous contrast. COMPARISON:  03/30/2015 FINDINGS: Brain: Areas of vasogenic edema are identified in the right frontal lobe, right occipital lobe, and left frontoparietal region. Given the chest lesion and lymphadenopathy identified in the chest on recent CT imaging, metastatic disease to the brain  is highly likely. No evidence for acute hemorrhage, no hydrocephalus, no abnormal extra-axial fluid collection. 2 mm left-to-right midline shift evident. Vascular: No hyperdense vessel or unexpected calcification. Skull: No evidence for fracture. No worrisome lytic or sclerotic lesion. Sinuses/Orbits: The visualized paranasal sinuses and mastoid air cells are clear. Visualized portions of the globes and intraorbital fat are unremarkable. Other: None. IMPRESSION: Multiple areas of vasogenic edema in the brain with focal low-density lesions visible in the left frontal and left parietal lobes. Given the findings in the chest on CT scan of 10/11/2016 , features on today's head CT are highly suspicious for intracranial metastatic disease. Edema causes 2 mm left-to-right midline shift. MRI without and with contrast recommended to further evaluate. Electronically Signed   By: Misty Stanley M.D.   On: 10/14/2016 16:27   Mr Jeri Cos ZO Contrast  Result Date: 10/15/2016 CLINICAL DATA:  Patient with lung cancer, concerning findings on CT head. Unwitnessed fall in the shower. EXAM: MRI HEAD WITHOUT AND WITH CONTRAST TECHNIQUE: Multiplanar, multiecho pulse sequences of the brain and surrounding structures were obtained without and with intravenous contrast. CONTRAST:  MultiHance 7 mL. COMPARISON:  CT head 10/14/16.  CT neck 10/11/2016. FINDINGS: Brain: Multiple metastatic lesions are seen throughout the brain, some of which display restricted diffusion and central necrosis. The majority of these are in the posterior fossa, affecting both cerebellar hemispheres. Marked vasogenic edema is associated with the the RIGHT frontal and LEFT parietal lesions although vasogenic edema is also observed in both cerebellar hemispheres. Index lesion in the RIGHT frontal lobe, superficial location, slight dural thickening, measures 17 mm diameter. Only mild LEFT-to-RIGHT shift is observed, 1-2 mm. Due to the multiplicity, as well as size of  cerebellar lesions, along with associated vasogenic edema, the patient is at risk for obstructive hydrocephalus. Vascular: Normal flow voids. Skull and upper cervical spine: No definite osseous lesions. Sinuses/Orbits: No sinus or mastoid disease. Other: None. IMPRESSION: Multiple metastatic lesions throughout the brain, as suspected from previous CT scans. Mild LEFT-to-RIGHT shift of 1-2 mm. The multiplicity and size of BILATERAL cerebellar lesions puts the patient at risk for obstructive hydrocephalus. Close clinical surveillance is recommended. Electronically Signed   By: Staci Righter M.D.   On: 10/15/2016 13:38    Assessment:  The patient is a 71 y.o. 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  She presented with neck adenopathy and dizziness s/p a fall.  She is s/p cervical lymph node biopsy on 10/12/2016.  Pathology is pending.  She has a > 50 pack year smoking history.  Neck CT on 10/11/2016 revealed bilateral cervical adenopathy.  There was a 1.8 x 1.9 cm left level II lymph node. There were bilateral cm size level II, left level III,  and bilateral level IV adenopathy. 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   Chest CT on 10/11/2016 revealed a 3.6 cm posterior apical right upper lobe mass compatible with primary bronchogenic carcinoma. There was a new 1.3 cm central right upper lobe and 0.5 cm anterior right lower lobe pulmonary nodule. There was bilateral hilar, bilateral mediastinal, bilateral supraclavicular, and porta hepatis adenopathy. There was a new left adrenal nodule. There was a 4.4 cm partially visualized pancreatic tail cystic mass, increased in size, indeterminate for cystic pancreatic malignancy.  There were indeterminate small bilateral low attenuation renal masses and a new small low-attenuation splenic lesion. There was focal nodular asymmetry in the deep upper-outer right breast.    CXR on 10/14/2016 revealed possible nondisplaced fracture of the right seventh rib.    Head MRI on 10/15/2016 revealed multiple metastatic lesions throughout the brain. Mild LEFT-to-RIGHT shift of 1-2 mm.   She was started on Decadron and Keppra.  She denies any systemic complaints.  Plan:   1.  Oncology:  Discussed with patient results from multiple imaging studies.  Discuss concern for metastatic lung cancer with brain metastasis.  Discuss awaiting biopsy results from Dr. Angie Fava office.  Check tumor markers.  Discuss plan for whole brain radiation for CNS metastasis.  Discuss treatment of lung cancer (small cell versus non-small cell).  Discuss treatment is palliative as she has stage IV disease.    Discuss involving medical power of attorney regarding treatment decisions.  Briefly touched upon code status.  Patient was unaware of any of her scan results despite nursing informing me that the hospitalist had discussed this with her earlier today.  2.  Neurology:  Baseline dementia.  CNS metastasis.  Continue Decadron 4 mg IV q 6 hours.  Consult radiation oncology on 09/16/2016.  3.  Disposition:  Need social work assistance.  Need family meeting with patient to determine direction of care and medical POA.  Patient indicated she has a son, Traci [facility], in [place] who she would like to be medical POA.  No paperwork has been completed per the patient.  Her niece is also involved.   Thank you for allowing me to participate in Traci Holmes 's care.  I will follow her closely with you while hospitalized and after discharge in the outpatient department.   Lequita Asal, MD  10/15/2016, 2:12 PM

## 2016-10-15 NOTE — Progress Notes (Signed)
Reports pain in right fractured rib site upon movement. Pt has very limited memory recall from what doctors have told her in regards to tumor situatio. MRI done with report reviewed. VSS. Feed self with fair appetite; affect with pleasant and smiling.

## 2016-10-15 NOTE — NC FL2 (Deleted)
[place] LEVEL OF CARE SCREENING TOOL     IDENTIFICATION  Patient Name: Traci Holmes Birthdate: October 25, 1945 Sex: female Admission Date (Current Location): 10/14/2016  [place] and [place] Number:  Engineering geologist and Address:  [facility], [address], [place], [place] [address]      Provider Number: [id number]  Attending Physician Name and Address:  Hillary Bow, MD  Relative Name and Phone Number:       Current Level of Care: Hospital Recommended Level of Care: [place]  Memory Care Prior Approval Number:    Date Approved/Denied:   PASRR Number: [id number]  Discharge Plan: Domiciliary (Rest home)    Current Diagnoses: Patient Active Problem List   Diagnosis Date Noted  . Encephalopathy 10/14/2016  . Altered mental status   . Elevated troponin   . Seizure ([place])   . Bradycardia   . Demand ischemia ([place])   . Protein-calorie malnutrition, severe 02/09/2015  . Status epilepticus ([place]) 02/08/2015  . Dementia, old age   . Major neurocognitive disorder, unspecified 08/25/2014    Orientation RESPIRATION BLADDER Height & Weight     Self, Place  Normal Continent Weight: 84 lb 8 oz (38.3 kg) Height:  5' (152.4 cm)  BEHAVIORAL SYMPTOMS/MOOD NEUROLOGICAL BOWEL NUTRITION STATUS    Convulsions/Seizures Continent    AMBULATORY STATUS COMMUNICATION OF NEEDS Skin   Supervision Verbally Bruising                       Personal Care Assistance Level of Assistance  Bathing, Feeding, Dressing Bathing Assistance: Limited assistance Feeding assistance: Independent Dressing Assistance: Limited assistance     Functional Limitations Info             SPECIAL CARE FACTORS FREQUENCY                       Contractures Contractures Info: Not present    Additional Factors Info  Code Status, Allergies, Psychotropic Code Status Info: Full Allergies Info: Penicillins Psychotropic Info: Namenda,  Keppra, Remeron, Abilify         Current Medications (10/15/2016):  This is the current hospital active medication list Current Facility-Administered Medications  Medication Dose Route Frequency Provider Last Rate Last Dose  . acetaminophen (TYLENOL) tablet 650 mg  650 mg Oral Q6H PRN Baxter Hire, MD   650 mg at 10/14/16 2143   Or  . acetaminophen (TYLENOL) suppository 650 mg  650 mg Rectal Q6H PRN Baxter Hire, MD      . albuterol (PROVENTIL) (2.5 MG/3ML) 0.083% nebulizer solution 2.5 mg  2.5 mg Inhalation Q6H PRN Baxter Hire, MD      . ARIPiprazole (ABILIFY) tablet 1 mg  1 mg Oral Daily Baxter Hire, MD   1 mg at 10/15/16 1032  . Chlorhexidine Gluconate Cloth 2 % PADS 6 each  6 each Topical Q0600 Baxter Hire, MD   6 each at 10/15/16 [phone]  . dexamethasone (DECADRON) injection 10 mg  10 mg Intravenous Q6H Baxter Hire, MD   10 mg at 10/15/16 1211  . docusate sodium (COLACE) capsule 200 mg  200 mg Oral BID Baxter Hire, MD   200 mg at 10/15/16 1033  . feeding supplement (ENSURE ENLIVE) (ENSURE ENLIVE) liquid 237 mL  237 mL Oral TID Baxter Hire, MD   237 mL at 10/15/16 1340  . heparin injection 5,000 Units  5,000 Units Subcutaneous Q8H  Baxter Hire, MD   5,000 Units at 10/15/16 1328  . HYDROcodone-acetaminophen (NORCO/VICODIN) 5-325 MG per tablet 1 tablet  1 tablet Oral Q6H PRN Sudini, Alveta Heimlich, MD      . levETIRAcetam (KEPPRA) tablet 500 mg  500 mg Oral TID Baxter Hire, MD   500 mg at 10/15/16 1033  . memantine (NAMENDA) tablet 10 mg  10 mg Oral BID Baxter Hire, MD   10 mg at 10/15/16 1105  . mirtazapine (REMERON) tablet 15 mg  15 mg Oral QHS Baxter Hire, MD   15 mg at 10/14/16 2140  . mupirocin ointment (BACTROBAN) 2 % 1 application  1 application Nasal BID Baxter Hire, MD   1 application at 39/03/00 1033  . promethazine (PHENERGAN) tablet 25 mg  25 mg Oral Q6H PRN Baxter Hire, MD      . sertraline (ZOLOFT) tablet 100 mg  100 mg  Oral Daily Baxter Hire, MD   100 mg at 10/15/16 1032  . sodium chloride flush (NS) 0.9 % injection 3 mL  3 mL Intravenous Q12H Baxter Hire, MD   3 mL at 10/15/16 1037  . sodium chloride flush (NS) 0.9 % injection 3 mL  3 mL Intravenous PRN Baxter Hire, MD   3 mL at 10/15/16 1212  . Vitamin D (Ergocalciferol) (DRISDOL) capsule 50,000 Units  50,000 Units Oral Q7 days Baxter Hire, MD   50,000 Units at 10/14/16 2140     Discharge Medications: Please see discharge summary for a list of discharge medications.  Relevant Imaging Results:  Relevant Lab Results:   Additional Information [id number]  Zettie Pho, LCSW

## 2016-10-15 NOTE — Progress Notes (Signed)
Dr. Darvin Neighbours notified of pulse ox result on RA. Pt up in BR with PT with pt assisted back to bed. 02 applied with 93 %/2l/[employer] with starting sat 86-88% on RA. Pt denies SOB/color pink, skin warm/dry. No acute distress.
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
## 2016-10-15 NOTE — Evaluation (Signed)
Physical Therapy Evaluation Patient Details Name: IRA BUSBIN MRN: [id number] DOB: 1946-01-24 Today's Date: 10/15/2016   History of Present Illness  Lanore Renderos is a 71yo white female who comes to [place] on 8/24 after being found c LOC in shower at [facility]. Imaging revealing of Rt Rib 8 fracture. Pt recently with Lung biopsy result not yet back, MRI this admission consistent with Brain mets presumtive LungCA. PMH: seizures, alzheimer's dementia, HLD. AMS at baseline per son.   Clinical Impression  Pt admitted with above diagnosis. Pt currently with functional limitations due to the deficits listed below (see "PT Problem List"). Upon entry, the patient is received semirecumbent in bed, no family/caregiver present. The pt is awake and agreeable to participate. No acute distress noted at this time. The pt is alert and oriented to self, conversational, and following simple commands consistently. Pt received on room air O2 c SpO2: 84% p AMB to bathroom, trialed on 2LPM in supine recovery with RN, SpO2: 93%. Functional mobility assessment demonstrates moderate strength impairment globally, partially antalgic, especially in RUE, the pt now requiring Mod-assist physical assistance for bed mobility, transfers, and gait, whereas the patient performed these at a higher level of independence PTA. Mobility also demonstrates wide based gait pattern consistent with cerebellar ataxia, supported by MRI mets to cerebellum. Empirically, the patient demonstrates increased risk of recurrent falls AEB gait speed <0.19m/s, forward reach <3", and demonstrated inability to achieve balance with modified independence throughout session. Pt will benefit from skilled PT intervention to increase independence and safety with basic mobility in preparation for discharge to the venue listed below.       Follow Up Recommendations SNF;Supervision for mobility/OOB    Equipment Recommendations  None recommended by PT     Recommendations for Other Services       Precautions / Restrictions Precautions Precautions: Fall Precaution Comments: hisotyr of fallsseizures, LOC PTA with fall in shower Restrictions Weight Bearing Restrictions: No      Mobility  Bed Mobility Overal bed mobility: Needs Assistance Bed Mobility: Supine to Sit;Sit to Supine     Supine to sit: Min assist Sit to supine: Mod assist   General bed mobility comments: d/t arresting Right thorax pain  Transfers Overall transfer level: Needs assistance Equipment used: None;1 person hand held assist Transfers: Sit to/from Stand Sit to Stand: Mod assist         General transfer comment: axillary support bilat due to rib fracture, use of RUE worsens pain   Ambulation/Gait Ambulation/Gait assistance: Mod assist Ambulation Distance (Feet): 20 Feet Assistive device: None;1 person hand held assist   Gait velocity: <0.13m/s   General Gait Details: wide based gait, consistent with cerebellar ataxia, dragging RLE due to antalgic tendencies in avoidance of Rt trunk lateral flexion/flexion during swing phase.   Stairs            Wheelchair Mobility    Modified Rankin (Stroke Patients Only)       Balance Overall balance assessment: History of Falls;Needs assistance         Standing balance support: During functional activity;Bilateral upper extremity supported Standing balance-Leahy Scale: Zero                               Pertinent Vitals/Pain Pain Assessment: 0-10 Pain Score: 8  Pain Location: Right lateral thorax, with RUE weight bearing and functional mobility only; no pain at rest.  Pain Descriptors / Indicators: Aching  Pain Intervention(s): Limited activity within patient's tolerance;Monitored during session    [place] expects to be discharged to:: Assisted living Living Arrangements:  ([facility] )             Home Equipment: Other (comment) (unclear at this  time, pt reports AMB s AD, but has AMS at baseline)      Prior Function           Comments: pt with baseline AMS, poor historian      Hand Dominance        Extremity/Trunk Assessment                Communication   Communication: No difficulties  Cognition Arousal/Alertness: Awake/alert Behavior During Therapy: WFL for tasks assessed/performed Overall Cognitive Status: History of cognitive impairments - at baseline                                 General Comments: oriented to self, but confused otherwise with good cognitive compensation during conversation, circumlocution, changing the subject, etc.       General Comments      Exercises     Assessment/Plan    PT Assessment Patient needs continued PT services  PT Problem List Decreased strength;Decreased activity tolerance;Decreased balance;Decreased coordination;Decreased mobility;Decreased knowledge of precautions;Decreased knowledge of use of DME;Decreased cognition;Pain;Cardiopulmonary status limiting activity       PT Treatment Interventions Functional mobility training;Therapeutic activities;Therapeutic exercise;Balance training;Neuromuscular re-education;Patient/family education    PT Goals (Current goals can be found in the Care Plan section)  Acute Rehab PT Goals PT Goal Formulation: Patient unable to participate in goal setting    Frequency Min 2X/week   Barriers to discharge        Co-evaluation               AM-PAC PT "6 Clicks" Daily Activity  Outcome Measure Difficulty turning over in bed (including adjusting bedclothes, sheets and blankets)?: A Lot Difficulty moving from lying on back to sitting on the side of the bed? : Unable Difficulty sitting down on and standing up from a chair with arms (e.g., wheelchair, bedside commode, etc,.)?: Unable Help needed moving to and from a bed to chair (including a wheelchair)?: Total Help needed walking in hospital room?:  Total Help needed climbing 3-5 steps with a railing? : Total 6 Click Score: 7    End of Session Equipment Utilized During Treatment: Gait belt;Oxygen Activity Tolerance: Patient tolerated treatment well;Patient limited by fatigue;Patient limited by pain Patient left: in bed Nurse Communication: Mobility status PT Visit Diagnosis: Muscle weakness (generalized) (M62.81);Other symptoms and signs involving the nervous system (R29.898);History of falling (Z91.81);Pain Pain - Right/Left: Right Pain - part of body:  (thorax, (Rib 8 fracture) )    Time: 1413-1430 PT Time Calculation (min) (ACUTE ONLY): 17 min   Charges:   PT Evaluation $PT Eval Moderate Complexity: 1 Mod PT Treatments $Therapeutic Activity: 8-22 mins   PT G Codes:        3:18 PM, 11-11-16 Etta Grandchild, PT, DPT Physical Therapist - [place] [phone] ([place])  [phone] (mobile)   Buccola,Allan C Nov 11, 2016, 3:14 PM

## 2016-10-15 NOTE — Progress Notes (Signed)
[place] [facility] [place] NAME: Traci Holmes    MR#:  [id number]  DATE OF BIRTH:  [birth year]  SUBJECTIVE:  CHIEF COMPLAINT:   Chief Complaint  Patient presents with  . Fall   Confused. Complains of right chest pain  REVIEW OF SYSTEMS:    Review of Systems  Constitutional: Negative for chills and fever.  HENT: Negative for sore throat.   Eyes: Negative for blurred vision, double vision and pain.  Respiratory: Positive for cough. Negative for hemoptysis, shortness of breath and wheezing.   Cardiovascular: Positive for chest pain. Negative for palpitations, orthopnea and leg swelling.  Gastrointestinal: Negative for abdominal pain, constipation, diarrhea, heartburn, nausea and vomiting.  Genitourinary: Negative for dysuria and hematuria.  Musculoskeletal: Negative for back pain and joint pain.  Skin: Negative for rash.  Neurological: Negative for sensory change, speech change, focal weakness and headaches.  Endo/Heme/Allergies: Does not bruise/bleed easily.  Psychiatric/Behavioral: Positive for memory loss. Negative for depression. The patient is not nervous/anxious.    DRUG ALLERGIES:   Allergies  Allergen Reactions  . Penicillins Other (See Comments)    Reaction: unknown  Patient cannot answer follow-up questions    VITALS:  Blood pressure (!) 125/42, pulse 63, temperature 99 F (37.2 C), temperature source Oral, resp. rate 18, height 5' (1.524 m), weight 38.3 kg (84 lb 8 oz), SpO2 90 %.  PHYSICAL EXAMINATION:   Physical Exam  GENERAL:  71 y.o.-year-old patient lying in the bed with no acute distress.  EYES: Pupils equal, round, reactive to light and accommodation. No scleral icterus. Extraocular muscles intact.  HEENT: Head atraumatic, normocephalic. Oropharynx and nasopharynx clear.  NECK:  Supple, no jugular venous distention. No thyroid enlargement, no tenderness.  LUNGS: Normal breath sounds bilaterally, no wheezing, rales,  rhonchi. No use of accessory muscles of respiration. Right chest wall tenderness CARDIOVASCULAR: S1, S2 normal. No murmurs, rubs, or gallops.  ABDOMEN: Soft, nontender, nondistended. Bowel sounds present. No organomegaly or mass.  EXTREMITIES: No cyanosis, clubbing or edema b/l.    NEUROLOGIC: Cranial nerves II through XII are intact. No focal Motor or sensory deficits b/l.   PSYCHIATRIC: The patient is alert and awake SKIN: No obvious rash, lesion, or ulcer.   LABORATORY PANEL:   CBC  Recent Labs Lab 10/14/16 1715  WBC 12.8*  HGB 14.2  HCT 43.0  PLT 230   ------------------------------------------------------------------------------------------------------------------ Chemistries   Recent Labs Lab 10/14/16 1715 10/15/16 0430  NA 140 136  K 3.7 4.2  CL 103 101  CO2 28 27  GLUCOSE 88 161*  BUN 13 14  CREATININE 0.45 0.44  CALCIUM 8.4* 9.4  AST 33  --   ALT 12*  --   ALKPHOS 64  --   BILITOT 0.3  --    ------------------------------------------------------------------------------------------------------------------  Cardiac Enzymes No results for input(s): TROPONINI in the last 168 hours. ------------------------------------------------------------------------------------------------------------------  RADIOLOGY:  Dg Chest 2 View  Result Date: 10/14/2016 CLINICAL DATA:  Right rib pain after falling in shower today. Initial encounter. History of uterine cancer. EXAM: CHEST  2 VIEW COMPARISON:  Chest CT 10/11/2016.  Radiographs 10/03/2016. FINDINGS: Right apical mass, mediastinal and hilar adenopathy again demonstrated. The heart size is stable. There is aortic atherosclerosis. There is a small right pleural effusion with mild right basilar atelectasis. No evidence of pneumothorax. Possible nondisplaced fracture of the right seventh rib laterally. IMPRESSION: Possible nondisplaced fracture of the right seventh rib with new small right pleural effusion and mild right  basilar  atelectasis. No evidence of pneumothorax. Known right apical mass, mediastinal and hilar adenopathy suspicious for lung cancer as seen on recent CT. Electronically Signed   By: Richardean Sale M.D.   On: 10/14/2016 16:49   Ct Head Wo Contrast  Result Date: 10/14/2016 CLINICAL DATA:  Unwitnessed fall in the shower. EXAM: CT HEAD WITHOUT CONTRAST TECHNIQUE: Contiguous axial images were obtained from the base of the skull through the vertex without intravenous contrast. COMPARISON:  03/30/2015 FINDINGS: Brain: Areas of vasogenic edema are identified in the right frontal lobe, right occipital lobe, and left frontoparietal region. Given the chest lesion and lymphadenopathy identified in the chest on recent CT imaging, metastatic disease to the brain is highly likely. No evidence for acute hemorrhage, no hydrocephalus, no abnormal extra-axial fluid collection. 2 mm left-to-right midline shift evident. Vascular: No hyperdense vessel or unexpected calcification. Skull: No evidence for fracture. No worrisome lytic or sclerotic lesion. Sinuses/Orbits: The visualized paranasal sinuses and mastoid air cells are clear. Visualized portions of the globes and intraorbital fat are unremarkable. Other: None. IMPRESSION: Multiple areas of vasogenic edema in the brain with focal low-density lesions visible in the left frontal and left parietal lobes. Given the findings in the chest on CT scan of 10/11/2016 , features on today's head CT are highly suspicious for intracranial metastatic disease. Edema causes 2 mm left-to-right midline shift. MRI without and with contrast recommended to further evaluate. Electronically Signed   By: Misty Stanley M.D.   On: 10/14/2016 16:27   Mr Jeri Cos KK Contrast  Result Date: 10/15/2016 CLINICAL DATA:  Patient with lung cancer, concerning findings on CT head. Unwitnessed fall in the shower. EXAM: MRI HEAD WITHOUT AND WITH CONTRAST TECHNIQUE: Multiplanar, multiecho pulse sequences of the  brain and surrounding structures were obtained without and with intravenous contrast. CONTRAST:  MultiHance 7 mL. COMPARISON:  CT head 10/14/16.  CT neck 10/11/2016. FINDINGS: Brain: Multiple metastatic lesions are seen throughout the brain, some of which display restricted diffusion and central necrosis. The majority of these are in the posterior fossa, affecting both cerebellar hemispheres. Marked vasogenic edema is associated with the the RIGHT frontal and LEFT parietal lesions although vasogenic edema is also observed in both cerebellar hemispheres. Index lesion in the RIGHT frontal lobe, superficial location, slight dural thickening, measures 17 mm diameter. Only mild LEFT-to-RIGHT shift is observed, 1-2 mm. Due to the multiplicity, as well as size of cerebellar lesions, along with associated vasogenic edema, the patient is at risk for obstructive hydrocephalus. Vascular: Normal flow voids. Skull and upper cervical spine: No definite osseous lesions. Sinuses/Orbits: No sinus or mastoid disease. Other: None. IMPRESSION: Multiple metastatic lesions throughout the brain, as suspected from previous CT scans. Mild LEFT-to-RIGHT shift of 1-2 mm. The multiplicity and size of BILATERAL cerebellar lesions puts the patient at risk for obstructive hydrocephalus. Close clinical surveillance is recommended. Electronically Signed   By: Staci Righter M.D.   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 Seems to have poor prognosis. On IV Decadron. Discussed with Dr. Mike Gip of oncology. Patient does have dementia and will need designated her [place] for 20 to make decisions. Would benefit from being DO NOT RESUSCITATE. Will need palliative care follow-up.  * right eighth rib fracture Pain control. Incentive spirometer.  * Acute encephalopathy over dementia due to intracranial metastasis.  * Seizure disorder. She is on  Keppra will continue current dose.  *  Dementia.  Monitor for inpatient delirium  * COPD. We'll continue her inhalers. No sign of exacerbation  * Severe protein calorie malnutrition.  Supplements added  All the records are reviewed and case discussed with Care Management/Social Worker Management plans discussed with the patient, family and they are in agreement.  CODE STATUS: FULL CODE  DVT Prophylaxis: SCDs  TOTAL TIME TAKING CARE OF THIS PATIENT: 40 minutes.   POSSIBLE D/C IN 1-2 DAYS, DEPENDING ON CLINICAL CONDITION.  Hillary Bow R M.D on 10/15/2016 at 1:47 PM  Between 7am to 6pm - Pager - [phone]  After 6pm go to [email or web address] - password [email or web address] [facility]  Office  [phone]  CC: Primary care physician; Raelyn Number, MD  Note: This dictation was prepared with Dragon dictation along with smaller phrase technology. Any transcriptional errors that result from this process are unintentional.

## 2016-10-16 LAB — LACTATE DEHYDROGENASE: LDH: 266 U/L — ABNORMAL HIGH (ref 98–192)

## 2016-10-16 MED ORDER — ADULT MULTIVITAMIN W/MINERALS CH
1.0000 | ORAL_TABLET | Freq: Every day | ORAL | Status: DC
  Administered 2016-10-17 – 2016-10-18 (×2): 1 via ORAL
  Filled 2016-10-16 (×2): qty 1

## 2016-10-16 MED ORDER — POLYETHYLENE GLYCOL 3350 17 G PO PACK
17.0000 g | PACK | Freq: Every day | ORAL | Status: AC
  Administered 2016-10-16 – 2016-10-17 (×2): 17 g via ORAL
  Filled 2016-10-16 (×2): qty 1

## 2016-10-16 NOTE — Progress Notes (Signed)
Initial Nutrition Assessment  DOCUMENTATION CODES:   Severe malnutrition in context of chronic illness, Underweight  INTERVENTION:  Continue Ensure Enlive po TID, each supplement provides 350 kcal and 20 grams of protein.  Provide multivitamin with minerals daily.  Will monitor outcome of discussions regarding goals of care.  NUTRITION DIAGNOSIS:   Malnutrition (Severe) related to chronic illness (possible stage IV lung cancer with metastasis to brain) as evidenced by severe depletion of body fat, severe depletion of muscle mass, 8.2 percent weight loss over 3 months.  GOAL:   Patient will meet greater than or equal to 90% of their needs  MONITOR:   PO intake, Supplement acceptance, Labs, Weight trends, I & O's  REASON FOR ASSESSMENT:   Malnutrition Screening Tool    ASSESSMENT:   71 year old female with PMHx of Alzheimer's dementia, seizures, HLD, remote hx of uterine cancer s/p hysterectomy, COPD, recently discovered right upper lobe lung mass pending lymph node biopsy results who presents with acute encephalopathy, found to have multiple intracranial metastasis, right eighth rib fracture after recent fall.   -Per Dr. Kem Parkinson note on 8/25 patient with likely stage IV lung cancer with brain metastasis. Still pending biopsy results. Any treatment pursued would be palliative. -Pending PMT consult to discuss goals of care.  Spoke with patient at bedside. She is not able to provide a very good history in setting of dementia. No family present at time of assessment. Patient reports her appetite has been decreased, but she is not sure for how long. She reports she lives at a facility and is served three meals per day. She is unsure how much of her meals she eats. She does report she receives Ensure daily and enjoys Ensure. Patient endorses rib pain. Denies any N/V, abdominal pain, trouble chewing/swallowing, or constipation/diarrhea.  Patient endorses weight loss but is not sure  of specific weight history. Per chart patient was 92 lbs on 07/19/2016. She has lost 7.5 lbs (8.2% body weight) over the past 3 months, which is significant for time frame.  Meal Completion: 25% of lunch yesterday and breakfast today per chart (no other meals recorded)  Medications reviewed and include: Decadron 10 mg Q6hrs, Colace, Remeron 15 mg daily, Miralax, sertraline, vitamin D 50000 units every 7 days.  Labs reviewed: Glucose 161. Positive for MRSA.  Nutrition-Focused physical exam completed. Findings are severe fat depletion, severe muscle depletion, and no edema.   Discussed with RN.  Diet Order:  Diet regular Room service appropriate? Yes; Fluid consistency: Thin  Skin:  Reviewed, no issues  Last BM:  PTA (10/13/2016 per chart)  Height:   Ht Readings from Last 1 Encounters:  10/14/16 5' (1.524 m)    Weight:   Wt Readings from Last 1 Encounters:  10/14/16 84 lb 8 oz (38.3 kg)    Ideal Body Weight:  45.5 kg  BMI:  Body mass index is 16.5 kg/m.  Estimated Nutritional Needs:   Kcal:  1200-1400 (approximately 31-36.5 kcal/kg)  Protein:  60-70 grams (1.6-1.8 grams/kg)  Fluid:  1.2-1.3 L/day (30-35 ml/kg)  EDUCATION NEEDS:   Education needs no appropriate at this time  Willey Blade, MS, RD, LDN Pager: [phone] After Hours Pager: [phone]

## 2016-10-16 NOTE — Progress Notes (Signed)
Alert, pleasant, memory impairment with retention of information related to diagnosis/care. Pain in right rib area with movement/cough with pt declining prn pain med at this time. Limited appetite/tolerating diet. No seizure activity noted. Resting quietly in bed. 02 @ 2l/[place] continued.

## 2016-10-16 NOTE — Clinical Social Work Note (Addendum)
CSW received voicemail after shift that the patient's son wants to discuss discharge planning. CSW will call the patient's son when able to advise him that the patient will be returning to [facility] with PT provided at the ALF as noted in the assessment.   The patient has an upcoming guardianship hearing on 10/20/16 due to lack of involvement in care by the patient's family.   CSW attempted to call the patient's son and left a HIPPA compliant voicemail.  CSW is following.  Santiago Bumpers, MSW, Latanya Presser [phone]

## 2016-10-16 NOTE — Progress Notes (Signed)
[place] [facility] [place] NAME: Traci Holmes    MR#:  [id number]  DATE OF BIRTH:  [birth year]  SUBJECTIVE:  CHIEF COMPLAINT:   Chief Complaint  Patient presents with  . Fall   Patient is pleasantly confused. Still has right-sided chest pain. Poor historian. She does not remember our discussion regarding her lung cancer and metastasis  REVIEW OF SYSTEMS:    Review of Systems  Constitutional: Negative for chills and fever.  HENT: Negative for sore throat.   Eyes: Negative for blurred vision, double vision and pain.  Respiratory: Positive for cough. Negative for hemoptysis, shortness of breath and wheezing.   Cardiovascular: Positive for chest pain. Negative for palpitations, orthopnea and leg swelling.  Gastrointestinal: Negative for abdominal pain, constipation, diarrhea, heartburn, nausea and vomiting.  Genitourinary: Negative for dysuria and hematuria.  Musculoskeletal: Negative for back pain and joint pain.  Skin: Negative for rash.  Neurological: Negative for sensory change, speech change, focal weakness and headaches.  Endo/Heme/Allergies: Does not bruise/bleed easily.  Psychiatric/Behavioral: Positive for memory loss. Negative for depression. The patient is not nervous/anxious.    DRUG ALLERGIES:   Allergies  Allergen Reactions  . Penicillins Other (See Comments)    Reaction: unknown  Patient cannot answer follow-up questions    VITALS:  Blood pressure (!) 113/39, pulse (!) 59, temperature 98 F (36.7 C), temperature source Oral, resp. rate 20, height 5' (1.524 m), weight 38.3 kg (84 lb 8 oz), SpO2 95 %.  PHYSICAL EXAMINATION:   Physical Exam  GENERAL:  71 y.o.-year-old patient lying in the bed with no acute distress.  EYES: Pupils equal, round, reactive to light and accommodation. No scleral icterus. Extraocular muscles intact.  HEENT: Head atraumatic, normocephalic. Oropharynx and nasopharynx clear.  NECK:  Supple, no jugular  venous distention. No thyroid enlargement, no tenderness.  LUNGS: Normal breath sounds bilaterally, no wheezing, rales, rhonchi. No use of accessory muscles of respiration. Right chest wall tenderness CARDIOVASCULAR: S1, S2 normal. No murmurs, rubs, or gallops.  ABDOMEN: Soft, nontender, nondistended. Bowel sounds present. No organomegaly or mass.  EXTREMITIES: No cyanosis, clubbing or edema b/l.    NEUROLOGIC: Cranial nerves II through XII are intact. No focal Motor or sensory deficits b/l.   PSYCHIATRIC: The patient is alert and awake SKIN: No obvious rash, lesion, or ulcer.   LABORATORY PANEL:   CBC  Recent Labs Lab 10/14/16 1715  WBC 12.8*  HGB 14.2  HCT 43.0  PLT 230   ------------------------------------------------------------------------------------------------------------------ Chemistries   Recent Labs Lab 10/14/16 1715 10/15/16 0430  NA 140 136  K 3.7 4.2  CL 103 101  CO2 28 27  GLUCOSE 88 161*  BUN 13 14  CREATININE 0.45 0.44  CALCIUM 8.4* 9.4  AST 33  --   ALT 12*  --   ALKPHOS 64  --   BILITOT 0.3  --    ------------------------------------------------------------------------------------------------------------------  Cardiac Enzymes No results for input(s): TROPONINI in the last 168 hours. ------------------------------------------------------------------------------------------------------------------  RADIOLOGY:  Dg Chest 2 View  Result Date: 10/14/2016 CLINICAL DATA:  Right rib pain after falling in shower today. Initial encounter. History of uterine cancer. EXAM: CHEST  2 VIEW COMPARISON:  Chest CT 10/11/2016.  Radiographs 10/03/2016. FINDINGS: Right apical mass, mediastinal and hilar adenopathy again demonstrated. The heart size is stable. There is aortic atherosclerosis. There is a small right pleural effusion with mild right basilar atelectasis. No evidence of pneumothorax. Possible nondisplaced fracture of the right seventh rib laterally.  IMPRESSION:  Possible nondisplaced fracture of the right seventh rib with new small right pleural effusion and mild right basilar atelectasis. No evidence of pneumothorax. Known right apical mass, mediastinal and hilar adenopathy suspicious for lung cancer as seen on recent CT. Electronically Signed   By: Richardean Sale M.D.   On: 10/14/2016 16:49   Ct Head Wo Contrast  Result Date: 10/14/2016 CLINICAL DATA:  Unwitnessed fall in the shower. EXAM: CT HEAD WITHOUT CONTRAST TECHNIQUE: Contiguous axial images were obtained from the base of the skull through the vertex without intravenous contrast. COMPARISON:  03/30/2015 FINDINGS: Brain: Areas of vasogenic edema are identified in the right frontal lobe, right occipital lobe, and left frontoparietal region. Given the chest lesion and lymphadenopathy identified in the chest on recent CT imaging, metastatic disease to the brain is highly likely. No evidence for acute hemorrhage, no hydrocephalus, no abnormal extra-axial fluid collection. 2 mm left-to-right midline shift evident. Vascular: No hyperdense vessel or unexpected calcification. Skull: No evidence for fracture. No worrisome lytic or sclerotic lesion. Sinuses/Orbits: The visualized paranasal sinuses and mastoid air cells are clear. Visualized portions of the globes and intraorbital fat are unremarkable. Other: None. IMPRESSION: Multiple areas of vasogenic edema in the brain with focal low-density lesions visible in the left frontal and left parietal lobes. Given the findings in the chest on CT scan of 10/11/2016 , features on today's head CT are highly suspicious for intracranial metastatic disease. Edema causes 2 mm left-to-right midline shift. MRI without and with contrast recommended to further evaluate. Electronically Signed   By: Misty Stanley M.D.   On: 10/14/2016 16:27   Mr Jeri Cos RJ Contrast  Result Date: 10/15/2016 CLINICAL DATA:  Patient with lung cancer, concerning findings on CT head.  Unwitnessed fall in the shower. EXAM: MRI HEAD WITHOUT AND WITH CONTRAST TECHNIQUE: Multiplanar, multiecho pulse sequences of the brain and surrounding structures were obtained without and with intravenous contrast. CONTRAST:  MultiHance 7 mL. COMPARISON:  CT head 10/14/16.  CT neck 10/11/2016. FINDINGS: Brain: Multiple metastatic lesions are seen throughout the brain, some of which display restricted diffusion and central necrosis. The majority of these are in the posterior fossa, affecting both cerebellar hemispheres. Marked vasogenic edema is associated with the the RIGHT frontal and LEFT parietal lesions although vasogenic edema is also observed in both cerebellar hemispheres. Index lesion in the RIGHT frontal lobe, superficial location, slight dural thickening, measures 17 mm diameter. Only mild LEFT-to-RIGHT shift is observed, 1-2 mm. Due to the multiplicity, as well as size of cerebellar lesions, along with associated vasogenic edema, the patient is at risk for obstructive hydrocephalus. Vascular: Normal flow voids. Skull and upper cervical spine: No definite osseous lesions. Sinuses/Orbits: No sinus or mastoid disease. Other: None. IMPRESSION: Multiple metastatic lesions throughout the brain, as suspected from previous CT scans. Mild LEFT-to-RIGHT shift of 1-2 mm. The multiplicity and size of BILATERAL cerebellar lesions puts the patient at risk for obstructive hydrocephalus. Close clinical surveillance is recommended. Electronically Signed   By: Staci Righter M.D.   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  MRI of the brain shows multiple brain metastases with predominantly in bilateral cerebellar region. On IV Decadron. Discussed with Dr. Mike Gip of oncology.  Consulted radiation oncology and palliative care. Called son Thressa Sheller to update and discuss prognosis and CODE STATUS. Left white  female.  * Right eighth rib fracture Pain control. Incentive spirometer.  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  * Seizure disorder. She is on Keppra will continue current dose.  * Dementia.  Monitor for inpatient delirium Patient unable to make her own decisions.  * COPD. We'll continue her inhalers. No sign of exacerbation  * Severe protein calorie malnutrition.  Supplements added  All the records are reviewed and case discussed with Care Management/Social Worker Management plans discussed with the patient, family and they are in agreement.  CODE STATUS: FULL CODE  DVT Prophylaxis: SCDs  TOTAL TIME TAKING CARE OF THIS PATIENT: 30 minutes.   POSSIBLE D/C IN 1-2 DAYS, DEPENDING ON CLINICAL CONDITION.  Hillary Bow R M.D on 10/16/2016 at 12:02 PM  Between 7am to 6pm - Pager - [phone]  After 6pm go to [email or web address] - password [email or web address] [facility]  Office  [phone]  CC: Primary care physician; Raelyn Number, MD  Note: This dictation was prepared with Dragon dictation along with smaller phrase technology. Any transcriptional errors that result from this process are unintentional.

## 2016-10-17 ENCOUNTER — Institutional Professional Consult (permissible substitution): Payer: Medicare Other | Admitting: Radiation Oncology

## 2016-10-17 ENCOUNTER — Inpatient Hospital Stay: Payer: Medicare Other | Admitting: Oncology

## 2016-10-17 DIAGNOSIS — K869 Disease of pancreas, unspecified: Secondary | ICD-10-CM

## 2016-10-17 DIAGNOSIS — C3490 Malignant neoplasm of unspecified part of unspecified bronchus or lung: Secondary | ICD-10-CM

## 2016-10-17 DIAGNOSIS — R55 Syncope and collapse: Secondary | ICD-10-CM

## 2016-10-17 DIAGNOSIS — Z7189 Other specified counseling: Secondary | ICD-10-CM

## 2016-10-17 DIAGNOSIS — Z79899 Other long term (current) drug therapy: Secondary | ICD-10-CM

## 2016-10-17 DIAGNOSIS — C799 Secondary malignant neoplasm of unspecified site: Secondary | ICD-10-CM

## 2016-10-17 DIAGNOSIS — S2231XA Fracture of one rib, right side, initial encounter for closed fracture: Secondary | ICD-10-CM

## 2016-10-17 DIAGNOSIS — R590 Localized enlarged lymph nodes: Secondary | ICD-10-CM

## 2016-10-17 DIAGNOSIS — Z515 Encounter for palliative care: Secondary | ICD-10-CM

## 2016-10-17 DIAGNOSIS — F0390 Unspecified dementia without behavioral disturbance: Secondary | ICD-10-CM

## 2016-10-17 DIAGNOSIS — C7931 Secondary malignant neoplasm of brain: Principal | ICD-10-CM

## 2016-10-17 LAB — CEA: CEA: 6.1 ng/mL — ABNORMAL HIGH (ref 0.0–4.7)

## 2016-10-17 LAB — CANCER ANTIGEN 19-9: CA 19-9: 95 U/mL — ABNORMAL HIGH (ref 0–35)

## 2016-10-17 NOTE — Progress Notes (Signed)
   10/17/16 1500  Clinical Encounter Type  Visited With Patient;Family;Patient and family together  Visit Type Initial;Other (Comment) (HCPOA)  Referral From Nurse  Spiritual Encounters  Spiritual Needs Literature;Emotional   HCPOA/AD materials dropped off with patient.  [place] reviewed materials with patient and Son at bedside.  Will be ready 8/28 to notarize.

## 2016-10-17 NOTE — Progress Notes (Signed)
[facility] Hematology/Oncology Progress Note  Date of admission: 10/14/2016  Hospital day:  10/17/2016  Chief Complaint: Traci Holmes is a 71 y.o. female with a > 50 pack year smoking history who was admitted through the emergency room after a syncopal event and head CT c/w multiple brain metastasis.  Subjective:  The patient denies any complaints.  Social History: The patient is accompanied by her son, Traci Holmes, today.  Allergies:  Allergies  Allergen Reactions  . Penicillins Other (See Comments)    Reaction: unknown  Patient cannot answer follow-up questions    Scheduled Medications: . ARIPiprazole  1 mg Oral Daily  . Chlorhexidine Gluconate Cloth  6 each Topical Q0600  . dexamethasone  10 mg Intravenous Q6H  . docusate sodium  200 mg Oral BID  . feeding supplement (ENSURE ENLIVE)  237 mL Oral TID  . heparin  5,000 Units Subcutaneous Q8H  . levETIRAcetam  500 mg Oral TID  . memantine  10 mg Oral BID  . mirtazapine  15 mg Oral QHS  . multivitamin with minerals  1 tablet Oral Daily  . mupirocin ointment  1 application Nasal BID  . sertraline  100 mg Oral Daily  . sodium chloride flush  3 mL Intravenous Q12H  . Vitamin D (Ergocalciferol)  50,000 Units Oral Q7 days    Review of Systems: GENERAL:  Feels "ok".  No fevers, sweats or weight loss. PERFORMANCE STATUS (ECOG):  2 HEENT:  No visual changes, runny nose, sore throat, mouth sores or tenderness. Lungs: No shortness of breath or cough.  No hemoptysis. Cardiac:  No chest pain, palpitations, orthopnea, or PND. Breasts:  Denies any masses.  Unaware of last mammogram. GI:  No nausea, vomiting, diarrhea, constipation, melena or hematochezia.  Unaware of last colonoscopy. GU:  No urgency, frequency, dysuria, or hematuria. Musculoskeletal:  Right rib pain after fall (imaging reveals fracture of right 7th rib).  No back pain.  No joint pain.  No muscle tenderness. Extremities:  No pain or swelling. Skin:  No  rashes or skin changes. Neuro:  Dizziness (no change). No headache, numbness or weakness, balance or coordination issues. Endocrine:  No diabetes, thyroid issues, hot flashes or night sweats. Psych:  No mood changes, depression or anxiety. Pain:  No focal pain. Review of systems:  All other systems reviewed and found to be negative.  Physical Exam: Blood pressure (!) 122/50, pulse 60, temperature 97.8 F (36.6 C), temperature source Oral, resp. rate 18, height 5' (1.524 m), weight 84 lb 8 oz (38.3 kg), SpO2 99 %.  GENERAL:  Thin pale woman sitting comfortably on the medical unit in no acute distress. MENTAL STATUS:  Alert and oriented to person, place and time. HEAD:  Long brown hair.  Normocephalic, atraumatic, face symmetric, no Cushingoid features. EYES:  Blue eyes.  No conjunctivitis or scleral icterus. SKIN:  Pale.  No rashes, ulcers or lesions. EXTREMITIES: No edema, no skin discoloration or tenderness.  No palpable cords. LYMPH NODES: Bilateral palpable cervical adenopathy (right > left).  NEUROLOGICAL: Alert & oriented, cranial nerves II-XII grossly intact; motor strength  And sensation intact; gait not tested; 4 beats of clonus right ankle.  No Babinski.  PSYCH:  Appropriate.   Results for orders placed or performed during the hospital encounter of 10/14/16 (from the past 48 hour(s))  CEA     Status: Abnormal   Collection Time: 10/16/16  4:43 AM  Result Value Ref Range   CEA 6.1 (H) 0.0 - 4.7  ng/mL    Comment: (NOTE)       Roche ECLIA methodology       Nonsmokers  <3.9                                     Smokers     <5.6 Performed At: [facility] [place], [place] [id number] Lindon Romp MD OJ:[id number]   Lactate dehydrogenase     Status: Abnormal   Collection Time: 10/16/16  4:43 AM  Result Value Ref Range   LDH 266 (H) 98 - 192 U/L    Comment: HEMOLYSIS AT THIS LEVEL MAY AFFECT RESULT  Cancer antigen 19-9     Status: Abnormal   Collection  Time: 10/16/16  4:43 AM  Result Value Ref Range   CA 19-9 95 (H) 0 - 35 U/mL    Comment: (NOTE) Roche ECLIA methodology Performed At: [facility] [place], [place] [id number] Lindon Romp MD LF:[id number]    No results found.  Assessment:  Traci Holmes is a 71 y.o. female with probable metastatic lung cancer (? small cell lung cancer).  She presented with neck adenopathy and dizziness s/p a fall.  She is s/p cervical lymph node biopsy on 10/12/2016.  Preliminary pathology suggests a neuroendocrine tumor .  She has a > 50 pack year smoking history.  Neck CT on 10/11/2016 revealed bilateral cervical adenopathy.  There was a 1.8 x 1.9 cm left level II lymph node. There were bilateral cm size level II, left level III, and bilateral level IV adenopathy. 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   Chest CT on 10/11/2016 revealed a 3.6 cm posterior apical right upper lobe mass compatible with primary bronchogenic carcinoma. There was a new 1.3 cm central right upper lobe and 0.5 cm anterior right lower lobe pulmonary nodule. There was bilateral hilar, bilateral mediastinal, bilateral supraclavicular, and porta hepatis adenopathy. There was a new left adrenal nodule. There was a 4.4 cm partially visualized pancreatic tail cystic mass, increased in size, indeterminate for cystic pancreatic malignancy.  There were indeterminate small bilateral low attenuation renal masses and a new small low-attenuation splenic lesion. There was focal nodular asymmetry in the deep upper-outer right breast.   CXR on 10/14/2016 revealed possible nondisplaced fracture of the right seventh rib.    Head MRI on 10/15/2016 revealed multiple metastatic lesions throughout the brain. Mild LEFT-to-RIGHT shift of 1-2 mm.   She was started on Decadron and Keppra.  She denies any systemic complaints.  Plan:   1.   Oncology:  Discussed with Dr. Jamal Collin today preliminary pathology which suggests small cell lung cancer.  Await final pathology.  Discussed with patient and son.  Discussed plan for CNS radiation secondary to multiple brain metastasis.  Patient has been seen by Dr. Baruch Gouty, radiation oncologist today.  Preliminary discussion regarding treatment and prognosis of small cell lung cancer.  Patient has a pancreatic mass indeterminate for malignancy.  CA19-9 is elevated (95).  She also has focal nodular asymmetry in the outer quadrant of the right breast.  Breast exam reveals a 1.5 cm smooth mobile mass at the 10:00 position by Dr Jamal Collin.  Bedside ultrasound is c/w a simple cyst.  No intervention needed.  Discussed medical power of attorney regarding treatment decisions.  Patient  wishes to make her son medical power of attorney.  Discussed code status.  Palliative care involved.  2.  Neurology:  Baseline dementia.  CNS metastasis.  Continue Decadron 4 mg IV q 6 hours. Radiation oncology plans 3000 cGy in 10 fractions.  Treatment will begin soon.  She can receive treatment in outpatient department.   Lequita Asal, MD  10/17/2016, 7:12 PM

## 2016-10-17 NOTE — Progress Notes (Signed)
No charge note:  Palliative consult received:  Meeting schedule with patient's caretaker, Thayer Headings and patient's sonLanny Hurst- at [place], AGNP-C Palliative Medicine  Please call Palliative Medicine team phone with any questions [phone]. For individual providers please see AMION.

## 2016-10-17 NOTE — Progress Notes (Signed)
SATURATION QUALIFICATIONS: (This note is used to comply with regulatory documentation for home oxygen)  Patient Saturations on Room Air at Rest = 86%  Patient Saturations on Room Air while Ambulating = 82%  Patient Saturations on 2 Liters of oxygen while Ambulating = 97%  Please briefly explain why patient needs home oxygen: lung cancer

## 2016-10-17 NOTE — Progress Notes (Signed)
[place] at [place] NAME: Traci Holmes    MR#:  [id number]  DATE OF BIRTH:  [birth year]  SUBJECTIVE:  CHIEF COMPLAINT:   Chief Complaint  Patient presents with  . Fall   Seen earlier today. Alert and awake. Doesn't remember many things we discussed during this stay  REVIEW OF SYSTEMS:    Review of Systems  Constitutional: Negative for chills and fever.  HENT: Negative for sore throat.   Eyes: Negative for blurred vision, double vision and pain.  Respiratory: Positive for cough. Negative for hemoptysis, shortness of breath and wheezing.   Cardiovascular: Positive for chest pain. Negative for palpitations, orthopnea and leg swelling.  Gastrointestinal: Negative for abdominal pain, constipation, diarrhea, heartburn, nausea and vomiting.  Genitourinary: Negative for dysuria and hematuria.  Musculoskeletal: Negative for back pain and joint pain.  Skin: Negative for rash.  Neurological: Negative for sensory change, speech change, focal weakness and headaches.  Endo/Heme/Allergies: Does not bruise/bleed easily.  Psychiatric/Behavioral: Positive for memory loss. Negative for depression. The patient is not nervous/anxious.    DRUG ALLERGIES:   Allergies  Allergen Reactions  . Penicillins Other (See Comments)    Reaction: unknown  Patient cannot answer follow-up questions    VITALS:  Blood pressure (!) 122/50, pulse 60, temperature 97.8 F (36.6 C), temperature source Oral, resp. rate 18, height 5' (1.524 m), weight 38.3 kg (84 lb 8 oz), SpO2 99 %.  PHYSICAL EXAMINATION:   Physical Exam  GENERAL:  71 y.o.-year-old patient lying in the bed with no acute distress.  EYES: Pupils equal, round, reactive to light and accommodation. No scleral icterus. Extraocular muscles intact.  HEENT: Head atraumatic, normocephalic. Oropharynx and nasopharynx clear.  NECK:  Supple, no jugular venous distention. No thyroid enlargement, no tenderness.   LUNGS: Normal breath sounds bilaterally, no wheezing, rales, rhonchi. No use of accessory muscles of respiration. Right chest wall tenderness CARDIOVASCULAR: S1, S2 normal. No murmurs, rubs, or gallops.  ABDOMEN: Soft, nontender, nondistended. Bowel sounds present. No organomegaly or mass.  EXTREMITIES: No cyanosis, clubbing or edema b/l.    NEUROLOGIC: Cranial nerves II through XII are intact. No focal Motor or sensory deficits b/l.   PSYCHIATRIC: The patient is alert and awake SKIN: No obvious rash, lesion, or ulcer.   LABORATORY PANEL:   CBC  Recent Labs Lab 10/14/16 1715  WBC 12.8*  HGB 14.2  HCT 43.0  PLT 230   ------------------------------------------------------------------------------------------------------------------ Chemistries   Recent Labs Lab 10/14/16 1715 10/15/16 0430  NA 140 136  K 3.7 4.2  CL 103 101  CO2 28 27  GLUCOSE 88 161*  BUN 13 14  CREATININE 0.45 0.44  CALCIUM 8.4* 9.4  AST 33  --   ALT 12*  --   ALKPHOS 64  --   BILITOT 0.3  --    ------------------------------------------------------------------------------------------------------------------  Cardiac Enzymes No results for input(s): TROPONINI in the last 168 hours. ------------------------------------------------------------------------------------------------------------------  RADIOLOGY:  No results found.   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  MRI of the brain shows multiple brain metastases with predominantly in bilateral cerebellar region. On IV Decadron. Prelim biopsy results suggest NET Radiation tomorrow  * Right eighth rib fracture with acute hypoxic resp failure. Pain control. Incentive spirometer. Oxygen  * Acute encephalopathy over dementia Improved  * Seizure disorder. She is on Keppra will continue current dose.  * Dementia.  Monitor for inpatient delirium Patient unable to  make her own decisions.  * COPD. We'll continue her inhalers. No sign of exacerbation  * Severe protein calorie malnutrition.  Supplements added  All the records are reviewed and case discussed with Care Management/Social Worker Management plans discussed with the patient, family and they are in agreement.  CODE STATUS: FULL CODE  DVT Prophylaxis: SCDs  TOTAL TIME TAKING CARE OF THIS PATIENT: 30 minutes.   POSSIBLE D/C IN 1-2 DAYS, DEPENDING ON CLINICAL CONDITION.  Hillary Bow R M.D on 10/17/2016 at 7:34 PM  Between 7am to 6pm - Pager - [phone]  After 6pm go to [email or web address] - password [email or web address] [facility]  Office  [phone]  CC: Primary care physician; Raelyn Number, MD  Note: This dictation was prepared with Dragon dictation along with smaller phrase technology. Any transcriptional errors that result from this process are unintentional.

## 2016-10-17 NOTE — Progress Notes (Signed)
Medications administered by student RN [id number] with supervision of Clinical Instructor Paydon Carll MSN, RN-BC.  

## 2016-10-17 NOTE — Progress Notes (Addendum)
Patient ID: Traci Holmes, female   DOB: [birth year], 71 y.o.   MRN: [id number] Surgery follow-up. This patient was being evaluated for cervical adenopathy. Workup revealed multiple neck nodes a right upper lung mass with additional nodules in the right lung and suspected brain metastases. Core biopsy of a left cervical node was performed on 10/12/2016. Preliminary report in discussing with the pathologist today shows that she may have a  neuroendocrine tumor. Additional staining is being performed for this. The patient was admitted over the weekend because of a fall and noted on MRI of the brain again to show the multiple metastases. The preliminary path report was conveyed to Dr. Mike Gip from medical oncology and also to Dr. Baruch Gouty from radiation oncology.  The patient appears to be a little bit confused but otherwise awake and she definitely recognize me. Not sure that she understands the extent of her problem. In the course of imaging the CT showed a questionable nodule in the right lateral breast area. Breast exam does show a 1.5 cm smooth mobile mass in the far outer right breast at the 10:00 location. A bedside ultrasound shows his this to be a 1.2 cm size rounded anechoic mass with enhancement. This is consistent with a simple cyst. Do not feel that this requires any intervention at this time.

## 2016-10-17 NOTE — Progress Notes (Signed)
Physical Therapy Treatment Patient Details Name: Traci Holmes MRN: [id number] DOB: 29-Nov-1945 Today's Date: 10/17/2016    History of Present Illness Traci Holmes is a 71yo white female who comes to [place] on 8/24 after being found c LOC in shower at [facility]. Imaging revealing of Rt Rib 8 fracture. Pt recently with Lung biopsy result not yet back, MRI this admission consistent with Brain mets presumtive LungCA. PMH: seizures, alzheimer's dementia, HLD. AMS at baseline per son.     PT Comments    Traci Holmes continues to require physical assist for mobility.  She requires mod assist for sit<>stand transfers with poor safety awareness and min assist when ambulating with RW. SpO2 in high 90s on 2L O2 upon PT arrival.  Drops down to 86% on RA with bed mobility and thus remained on 2L O2 for remainder of session.  SpO2 remains at or above 91% for remainder of session.  Recommendation for SNF at d/c remains appropriate.   Follow Up Recommendations  SNF;Supervision/Assistance - 24 hour     Equipment Recommendations  None recommended by PT    Recommendations for Other Services       Precautions / Restrictions Precautions Precautions: Fall;Other (comment) Precaution Comments: history of falls/seizures, LOC PTA with fall in shower; Monitor O2 Restrictions Weight Bearing Restrictions: No    Mobility  Bed Mobility Overal bed mobility: Needs Assistance Bed Mobility: Supine to Sit     Supine to sit: Min guard;HOB elevated     General bed mobility comments: Cues to roll onto her L side rather than pulling into sitting for decreased pain.  Pt uses bed rail.  Increased time and effort.  Transfers Overall transfer level: Needs assistance Equipment used: Rolling walker (2 wheeled) Transfers: Sit to/from Stand Sit to Stand: Mod assist         General transfer comment: Assist to boost to standing and pt is slow to rise due to pain.  Cues for hand placement and safe technique.     Ambulation/Gait Ambulation/Gait assistance: Min assist Ambulation Distance (Feet): 20 Feet Assistive device: Rolling walker (2 wheeled) Gait Pattern/deviations: Decreased step length - left;Decreased step length - right;Antalgic;Trunk flexed;Narrow base of support Gait velocity: decreased Gait velocity interpretation: <1.8 ft/sec, indicative of risk for recurrent falls General Gait Details: Pt dragging R foot at times and demonstrates flexed posture throughout despite cues for upright posture and forward gaze.  Pt with poor managment of RW, requiring min assist especially during turns.  Pt refused to ambulate in [place] due to pain.   Stairs            Wheelchair Mobility    Modified Rankin (Stroke Patients Only)       Balance Overall balance assessment: Needs assistance;History of Falls Sitting-balance support: Single extremity supported;Feet supported Sitting balance-Leahy Scale: Poor Sitting balance - Comments: Pt relies on at least 1UE support when sitting EOB   Standing balance support: Bilateral upper extremity supported;During functional activity Standing balance-Leahy Scale: Poor Standing balance comment: Relies on UE support for static and dynamic activities                            Cognition Arousal/Alertness: Awake/alert Behavior During Therapy: WFL for tasks assessed/performed Overall Cognitive Status: History of cognitive impairments - at baseline  Exercises      General Comments General comments (skin integrity, edema, etc.): SpO2 in high 90s on 2L O2 upon PT arrival.  Drops down to 86% on RA with bed mobility and thus remained on 2L O2 for remainder of session.  SpO2 remains at or above 91% for remainder of session.      Pertinent Vitals/Pain Pain Assessment: 0-10 Pain Score: 8  Pain Location: R flank Pain Descriptors / Indicators: Aching;Guarding;Grimacing Pain Intervention(s):  Limited activity within patient's tolerance;Monitored during session;Repositioned;Patient requesting pain meds-RN notified    Home Living                      Prior Function            PT Goals (current goals can now be found in the care plan section) Progress towards PT goals: Not progressing toward goals - comment (due to pain)    Frequency    Min 2X/week      PT Plan Current plan remains appropriate    Co-evaluation              AM-PAC PT "6 Clicks" Daily Activity  Outcome Measure  Difficulty turning over in bed (including adjusting bedclothes, sheets and blankets)?: Unable Difficulty moving from lying on back to sitting on the side of the bed? : Unable Difficulty sitting down on and standing up from a chair with arms (e.g., wheelchair, bedside commode, etc,.)?: Unable Help needed moving to and from a bed to chair (including a wheelchair)?: A Lot Help needed walking in hospital room?: A Lot Help needed climbing 3-5 steps with a railing? : Total 6 Click Score: 8    End of Session Equipment Utilized During Treatment: Gait belt;Oxygen Activity Tolerance: Patient limited by pain;Patient limited by fatigue Patient left: in chair;with call bell/phone within reach;with chair alarm set;with family/visitor present Nurse Communication: Mobility status;Other (comment) (SpO2) PT Visit Diagnosis: Muscle weakness (generalized) (M62.81);Other symptoms and signs involving the nervous system (R29.898);History of falling (Z91.81);Pain Pain - Right/Left: Right Pain - part of body:  (thorax, Rib 8 fracture)     Time: 1694-5038 PT Time Calculation (min) (ACUTE ONLY): 27 min  Charges:  $Gait Training: 8-22 mins $Therapeutic Activity: 8-22 mins                    G Codes:       Collie Siad PT, DPT 10/17/2016, 11:43 AM

## 2016-10-17 NOTE — Progress Notes (Signed)
Patient and son, [name], updated on current plan of care: radiation tomorrow then possible discharge back to [place] with PT there.   POA paperwork filled out by son and Chaplain to come back tomorrow to witness & notarize.

## 2016-10-17 NOTE — Consult Note (Signed)
NEW PATIENT EVALUATION  Name: Traci Holmes  MRN: [id number]  Date:   10/14/2016     DOB: March 26, 1945   This 71 y.o. female patient presents to the clinic for initial evaluation of brain metastasis.  REFERRING PHYSICIAN: No ref. provider found  CHIEF COMPLAINT:  Chief Complaint  Patient presents with  . Fall    DIAGNOSIS: The primary encounter diagnosis was Metastatic cancer ([place]). Diagnoses of Vasogenic cerebral edema ([place]), Multiple falls, Confusion, and Closed fracture of one rib of right side, initial encounter were also pertinent to this visit.   PREVIOUS INVESTIGATIONS:  Clinical notes reviewed Preliminary pathology report reviewed MRI scans and CT scans reviewed  HPI: Patient is a 71 year old female admitted to [facility] on 824 after being found unconscious at [facility]. Workup including MRI scan showed multiple brain metastasis. She also had CT scan of the chest showing a 3.600 posterior apical right lung mass compatible with primary bronchogenic carcinoma. There is also bilateral cervical adenopathy consistent with metastatic disease. Interestingly there was a 4.4 cm partially visualized pancreatic tail cystic mass indeterminant for cystic pancreatic cancer. She underwent a left upper cervical lymph node biopsy by Dr. Jamal Collin are with initial pathology suggesting neuroendocrine malignancy. She seen today in her hospital room for consideration of palliative radiation to her whole brain for metastatic disease. Patient does have early dementia. She specifically denies any decreased motor or sensory levels. Change in visual fields. She is having some headaches although has been started on Decadron.  PLANNED TREATMENT REGIMEN: Whole brain radiation  PAST MEDICAL HISTORY:  has a past medical history of Alzheimer disease; Aphasia; Cancer ([place]); Dementia; Hyperlipemia; Hypocalcemia; Major neurocognitive disorder, unspecified (08/25/2014); and Seizures ([place]).    PAST  SURGICAL HISTORY:  Past Surgical History:  Procedure Laterality Date  . ABDOMINAL HYSTERECTOMY     Does not remember date 30-40 years ago?    FAMILY HISTORY: family history includes Cancer in her father and mother.  SOCIAL HISTORY:  reports that she has been smoking Cigarettes.  She has been smoking about 1.00 pack per day. She has never used smokeless tobacco. She reports that she does not drink alcohol or use drugs.  ALLERGIES: Penicillins  MEDICATIONS:  Current Facility-Administered Medications  Medication Dose Route Frequency Provider Last Rate Last Dose  . acetaminophen (TYLENOL) tablet 650 mg  650 mg Oral Q6H PRN Baxter Hire, MD   650 mg at 10/17/16 1016   Or  . acetaminophen (TYLENOL) suppository 650 mg  650 mg Rectal Q6H PRN Baxter Hire, MD      . albuterol (PROVENTIL) (2.5 MG/3ML) 0.083% nebulizer solution 2.5 mg  2.5 mg Inhalation Q6H PRN Baxter Hire, MD      . ARIPiprazole (ABILIFY) tablet 1 mg  1 mg Oral Daily Baxter Hire, MD   1 mg at 10/17/16 1008  . Chlorhexidine Gluconate Cloth 2 % PADS 6 each  6 each Topical Q0600 Baxter Hire, MD   6 each at 10/17/16 0600  . dexamethasone (DECADRON) injection 10 mg  10 mg Intravenous Q6H Baxter Hire, MD   10 mg at 10/17/16 1125  . docusate sodium (COLACE) capsule 200 mg  200 mg Oral BID Baxter Hire, MD   200 mg at 10/17/16 1006  . feeding supplement (ENSURE ENLIVE) (ENSURE ENLIVE) liquid 237 mL  237 mL Oral TID Baxter Hire, MD   237 mL at 10/17/16 1011  . heparin injection 5,000 Units  5,000 Units  Subcutaneous Q8H Baxter Hire, MD   5,000 Units at 10/17/16 1325  . HYDROcodone-acetaminophen (NORCO/VICODIN) 5-325 MG per tablet 1 tablet  1 tablet Oral Q6H PRN Hillary Bow, MD   1 tablet at 10/17/16 1125  . levETIRAcetam (KEPPRA) tablet 500 mg  500 mg Oral TID Baxter Hire, MD   500 mg at 10/17/16 1008  . memantine (NAMENDA) tablet 10 mg  10 mg Oral BID Baxter Hire, MD   10 mg at 10/17/16  1008  . mirtazapine (REMERON) tablet 15 mg  15 mg Oral QHS Baxter Hire, MD   15 mg at 10/16/16 2027  . multivitamin with minerals tablet 1 tablet  1 tablet Oral Daily Hillary Bow, MD   1 tablet at 10/17/16 1008  . mupirocin ointment (BACTROBAN) 2 % 1 application  1 application Nasal BID Baxter Hire, MD   1 application at 74/08/14 1013  . promethazine (PHENERGAN) tablet 25 mg  25 mg Oral Q6H PRN Baxter Hire, MD      . sertraline (ZOLOFT) tablet 100 mg  100 mg Oral Daily Baxter Hire, MD   100 mg at 10/17/16 1007  . sodium chloride flush (NS) 0.9 % injection 3 mL  3 mL Intravenous Q12H Baxter Hire, MD   3 mL at 10/17/16 1014  . sodium chloride flush (NS) 0.9 % injection 3 mL  3 mL Intravenous PRN Baxter Hire, MD   3 mL at 10/16/16 1717  . Vitamin D (Ergocalciferol) (DRISDOL) capsule 50,000 Units  50,000 Units Oral Q7 days Baxter Hire, MD   50,000 Units at 10/14/16 2140    ECOG PERFORMANCE STATUS:  2 - Symptomatic, <50% confined to bed  REVIEW OF SYSTEMS: Patient is a poor historian.  Patient denies any weight loss, fatigue, weakness, fever, chills or night sweats. Patient denies any loss of vision, blurred vision. Patient denies any ringing  of the ears or hearing loss. No irregular heartbeat. Patient denies heart murmur or history of fainting. Patient denies any chest pain or pain radiating to her upper extremities. Patient denies any shortness of breath, difficulty breathing at night, cough or hemoptysis. Patient denies any swelling in the lower legs. Patient denies any nausea vomiting, vomiting of blood, or coffee ground material in the vomitus. Patient denies any stomach pain. Patient states has had normal bowel movements no significant constipation or diarrhea. Patient denies any dysuria, hematuria or significant nocturia. Patient denies any problems walking, swelling in the joints or loss of balance. Patient denies any skin changes, loss of hair or loss of  weight. Patient denies any excessive worrying or anxiety or significant depression. Patient denies any problems with insomnia. Patient denies excessive thirst, polyuria, polydipsia. Patient denies any swollen glands, patient denies easy bruising or easy bleeding. Patient denies any recent infections, allergies or URI. Patient "s visual fields have not changed significantly in recent time.    PHYSICAL EXAM: BP (!) 121/44 (BP Location: Left Arm)   Pulse (!) 59   Temp 98.8 F (37.1 C) (Oral)   Resp 20   Ht 5' (1.524 m)   Wt 84 lb 8 oz (38.3 kg)   SpO2 95%   BMI 16.50 kg/m  Frail-appearing female seen in her hospital room. Crude visual fields are within normal range. Motor sensory and DTR levels are equal and symmetric in the upper lower extremities. Well-developed well-nourished patient in NAD. HEENT reveals PERLA, EOMI, discs not visualized.  Oral cavity is clear. No oral  mucosal lesions are identified. Neck is clear without evidence of cervical or supraclavicular adenopathy. Lungs are clear to A&P. Cardiac examination is essentially unremarkable with regular rate and rhythm without murmur rub or thrill. Abdomen is benign with no organomegaly or masses noted. Motor sensory and DTR levels are equal and symmetric in the upper and lower extremities. Cranial nerves II through XII are grossly intact. Proprioception is intact. No peripheral adenopathy or edema is identified. No motor or sensory levels are noted. Crude visual fields are within normal range.  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 In 71 year old female  PLAN: At this time like to go ahead with whole brain radiation therapy. Would plan on delivering 3000 cGy in 10  fractions. Risks and benefits of treatment including hair loss fatigue alteration of blood counts skin reaction all were discussed with the patient and her caretaker from her assisted living facility. I first a set up and ordered CT simulation tomorrow. Will start her treatments as soon as possible. Case was discussed with medical oncology.  I would like to take this opportunity to thank you for allowing me to participate in the care of your patient.Armstead Peaks., MD

## 2016-10-18 ENCOUNTER — Other Ambulatory Visit: Payer: Self-pay | Admitting: Hematology and Oncology

## 2016-10-18 ENCOUNTER — Ambulatory Visit: Payer: Medicare Other | Admitting: General Surgery

## 2016-10-18 ENCOUNTER — Ambulatory Visit
Admission: RE | Admit: 2016-10-18 | Discharge: 2016-10-18 | Disposition: A | Discharge status: Home / Self Care | Payer: Medicare Other | Source: Ambulatory Visit | Attending: Radiation Oncology | Admitting: Radiation Oncology

## 2016-10-18 DIAGNOSIS — G9340 Encephalopathy, unspecified: Secondary | ICD-10-CM

## 2016-10-18 DIAGNOSIS — S2231XA Fracture of one rib, right side, initial encounter for closed fracture: Secondary | ICD-10-CM

## 2016-10-18 DIAGNOSIS — Z7189 Other specified counseling: Secondary | ICD-10-CM

## 2016-10-18 DIAGNOSIS — C3411 Malignant neoplasm of upper lobe, right bronchus or lung: Secondary | ICD-10-CM | POA: Insufficient documentation

## 2016-10-18 DIAGNOSIS — C799 Secondary malignant neoplasm of unspecified site: Secondary | ICD-10-CM

## 2016-10-18 DIAGNOSIS — Z510 Encounter for antineoplastic radiation therapy: Secondary | ICD-10-CM | POA: Insufficient documentation

## 2016-10-18 DIAGNOSIS — Z515 Encounter for palliative care: Secondary | ICD-10-CM

## 2016-10-18 DIAGNOSIS — F17210 Nicotine dependence, cigarettes, uncomplicated: Secondary | ICD-10-CM | POA: Insufficient documentation

## 2016-10-18 LAB — CBC
HCT: 42.8 % (ref 35.0–47.0)
Hemoglobin: 14.3 g/dL (ref 12.0–16.0)
MCH: 31.6 pg (ref 26.0–34.0)
MCHC: 33.3 g/dL (ref 32.0–36.0)
MCV: 94.9 fL (ref 80.0–100.0)
PLATELETS: 224 10*3/uL (ref 150–440)
RBC: 4.52 MIL/uL (ref 3.80–5.20)
RDW: 13.7 % (ref 11.5–14.5)
WBC: 13.1 10*3/uL — ABNORMAL HIGH (ref 3.6–11.0)

## 2016-10-18 MED ORDER — TRAMADOL HCL 50 MG PO TABS: 50.0000 mg | ORAL_TABLET | Freq: Four times a day (QID) | ORAL | 0 refills | Status: DC | PRN

## 2016-10-18 MED ORDER — ENOXAPARIN SODIUM 30 MG/0.3ML ~~LOC~~ SOLN: 30.0000 mg | SUBCUTANEOUS | Status: DC

## 2016-10-18 MED ORDER — DEXAMETHASONE 4 MG PO TABS: 4.0000 mg | ORAL_TABLET | Freq: Three times a day (TID) | ORAL | 0 refills | Status: AC

## 2016-10-18 MED ORDER — LIDOCAINE 5 % EX PTCH
1.0000 | MEDICATED_PATCH | CUTANEOUS | Status: DC
  Administered 2016-10-18: 1 via TRANSDERMAL
  Filled 2016-10-18: qty 1

## 2016-10-18 MED ORDER — ENOXAPARIN SODIUM 40 MG/0.4ML ~~LOC~~ SOLN: 40.0000 mg | SUBCUTANEOUS | Status: DC

## 2016-10-18 MED ORDER — LIDOCAINE 5 % EX PTCH: 1.0000 | MEDICATED_PATCH | CUTANEOUS | 0 refills | Status: DC

## 2016-10-18 MED ORDER — DEXAMETHASONE SODIUM PHOSPHATE 10 MG/ML IJ SOLN
4.0000 mg | Freq: Four times a day (QID) | INTRAMUSCULAR | Status: DC
  Administered 2016-10-18 (×2): 4 mg via INTRAVENOUS
  Filled 2016-10-18 (×2): qty 1

## 2016-10-18 NOTE — Progress Notes (Signed)
Daily Progress Note   Patient Name: Traci Holmes       Date: 10/18/2016 DOB: 1945/03/30  Age: 71 y.o. MRN#: [id number] Attending Physician: Hillary Bow, MD Primary Care Physician: Raelyn Number, MD Admit Date: 10/14/2016  Reason for Consultation/Follow-up: Establishing goals of care  Subjective: Met with patient. She does not recall any conversation from yesterday. She is unaware of her cancer diagnosis. When I bring it up she becomes tearful and worries about her son having to bear the burden of her care. She asks questions about her diagnosis, prognosis, and treatment. Her ability to retain information long term makes complicated decision making difficult. For this reason, I believe advance care planning and treatment decisions should be deferred to her next of kin. However, she is competent and has capacity to elect an HCPOA- she selected her son, Traci Holmes yesterday- and paperwork is completed- Chaplain service came by- but was unable to notarize- I believe they are trying to do this today.  Patient does note pain in R chest when moving or taking deep breaths. She is not requesting pain medication for this. I discussed maybe a lidocaine patch and she is agreeable.   I called her son Traci Holmes to discuss the above. Answered his questions re: mother's diagnosis and side effects of radiation tx and chemotherapy. Noted patient is scheduled for CT simuation today. Also discussed advanced care planning and code status and recommendations.   ROS  Length of Stay: 4  Current Medications: Scheduled Meds:  . ARIPiprazole  1 mg Oral Daily  . Chlorhexidine Gluconate Cloth  6 each Topical Q0600  . dexamethasone  4 mg Intravenous Q6H  . docusate sodium  200 mg Oral BID  . enoxaparin (LOVENOX) injection  30  mg Subcutaneous Q24H  . feeding supplement (ENSURE ENLIVE)  237 mL Oral TID  . levETIRAcetam  500 mg Oral TID  . memantine  10 mg Oral BID  . mirtazapine  15 mg Oral QHS  . multivitamin with minerals  1 tablet Oral Daily  . mupirocin ointment  1 application Nasal BID  . sertraline  100 mg Oral Daily  . sodium chloride flush  3 mL Intravenous Q12H  . Vitamin D (Ergocalciferol)  50,000 Units Oral Q7 days    Continuous Infusions:   PRN Meds: acetaminophen **OR** acetaminophen, albuterol,  HYDROcodone-acetaminophen, promethazine, sodium chloride flush  Physical Exam          Vital Signs: BP (!) 123/55 (BP Location: Left Arm)   Pulse 64   Temp (!) 97.5 F (36.4 C) (Oral)   Resp 18   Ht 5' (1.524 m)   Wt 38.3 kg (84 lb 8 oz)   SpO2 100%   BMI 16.50 kg/m  SpO2: SpO2: 100 % O2 Device: O2 Device: Nasal Cannula O2 Flow Rate: O2 Flow Rate (L/min): 2 L/min  Intake/output summary:  Intake/Output Summary (Last 24 hours) at 10/18/16 1304 Last data filed at 10/17/16 1330  Gross per 24 hour  Intake              240 ml  Output                0 ml  Net              240 ml   LBM: Last BM Date: 10/16/16 Baseline Weight: Weight: 40.2 kg (88 lb 9.6 oz) Most recent weight: Weight: 38.3 kg (84 lb 8 oz)       Palliative Assessment/Data:      Patient Active Problem List   Diagnosis Date Noted  . Metastatic cancer ([place])   . Closed fracture of one rib of right side   . Goals of care, counseling/discussion   . Palliative care by specialist   . Advance care planning   . Encephalopathy 10/14/2016  . Altered mental status   . Elevated troponin   . Seizure ([place])   . Bradycardia   . Demand ischemia ([place])   . Protein-calorie malnutrition, severe 02/09/2015  . Status epilepticus ([place]) 02/08/2015  . Dementia, old age   . Major neurocognitive disorder, unspecified 08/25/2014    Palliative Care Assessment & Plan   Patient Profile: 71 y.o. female  with past medical history of  alzheimer's dementia, multiple falls, uterine cancer, COPD  admitted on 10/14/2016 with another fall and increasing confusion. Workup reveals metastatic lung cancer to brain with vasogenic edema. Palliative medicine consulted for [place].       Assessment/Recommendations/Plan   Traci Holmes requests f/u discussion tomorrow if patient is still in hospital re: advance care planning and treatment decisions  Family agreeable to outpatient palliative f/u at discharge  Lidocaine patch to affected rib areas- 1 patch q12 hrs- leave on for 12 hours, take off for 12 hours, then reapply  PT consult for mobility- patient has not been out of bed since admission  Goals of Care and Additional Recommendations:  Limitations on Scope of Treatment: Full Scope Treatment  Code Status:  Full code  Prognosis:   Unable to determine  Discharge Planning:  Home with Palliative Services  Care plan was discussed with patient and family member.   Thank you for allowing the Palliative Medicine Team to assist in the care of this patient.   Time In: 1200 Time Out: 1240 Total Time 40 minutes Prolonged Time Billed No      Greater than 50%  of this time was spent counseling and coordinating care related to the above assessment and plan.  [name], AGNP-C Palliative Medicine   Please contact Palliative Medicine Team phone at [phone] for questions and concerns.

## 2016-10-18 NOTE — Consult Note (Signed)
Consultation Note Date: 10/18/2016   Patient Name: Traci Holmes  DOB: 03-31-1945  MRN: [id number]  Age / Sex: 71 y.o., female  PCP: Raelyn Number, MD Referring Physician: Hillary Bow, MD  Reason for Consultation: Establishing goals of care  HPI/Patient Profile: 71 y.o. female  with past medical history of alzheimer's dementia, multiple falls, uterine cancer, COPD  admitted on 10/14/2016 with another fall and increasing confusion. Workup reveals metastatic lung cancer to brain with vasogenic edema. Palliative medicine consulted for [place].       Clinical Assessment and Goals of Care: I met with the patient and her son, Thressa Sheller.  I introduced Palliative Medicine as specialized medical care for people living with serious illness. It focuses on providing relief from the symptoms and stress of a serious illness. The goal is to improve quality of life for both the patient and the family.  We discussed a brief life review of the patient. She was admitted from [facility] where she is under the care of [facility]. She has two sons- [name] and Dellis Filbert. Dellis Filbert is mentally disabled. She notes that she would want [name] to be her Media planner and [name] agrees to this. There is some questions about guardianship- there is a hearing scheduled on August 30th due [place] for guardianship as [name] has not been present. However, [name] has stated he is willing to be present going forward. Until hearing is complete- [name] is Optician, dispensing.   As far as functional and nutritional status she has been pretty much wheelchair bound recently. She requires assistance with dressing, and bathing. She is able to feed herself.     We discussed their current illness and what it means in the larger context of their on-going co-morbidities.  Natural disease trajectory and expectations at EOL were discussed.  She is able to participate on a limited basis in this conversation.  I attempted to elicit values and goals of care important to the patient. She is limited in her ability to verbalize what is important to her or what her preferences would be.  She states she hasn't thought much about this topic.  The difference between aggressive medical intervention and comfort care was explained and options were offered.  Advanced directives, concepts specific to code status, artifical feeding and hydration, and rehospitalization were considered and discussed. I reviewed advanced directives paperwork with patient and her son, gave directions on completing.   Hospice and Palliative Care services outpatient were explained and offered.   Primary Decision Maker NEXT OF KIN - patient's son- Thressa Sheller    SUMMARY OF RECOMMENDATIONS - Advance directives paperwork reviewed -Chaplain consult ordered for completion and notarizing -Patient and family still processing diagnosis and not ready to make decisions regarding treatment and code status today -Palliative medicine will follow up with family tomorrow- I believe patient will likely not remember our conversation tomorrow    Code Status/Advance Care Planning:  DNR   Palliative Prophylaxis:   Frequent Pain Assessment  Additional Recommendations (  Limitations, Scope, Preferences):  Full Scope Treatment  Psycho-social/Spiritual:   Desire for further Chaplaincy support:yes  Prognosis:    Unable to determine  Discharge Planning: To Be Determined  Primary Diagnoses: Present on Admission: . Encephalopathy   I have reviewed the medical record, interviewed the patient and family, and examined the patient. The following aspects are pertinent.  Past Medical History:  Diagnosis Date  . Alzheimer disease   . Aphasia   . Cancer [facility])    Uterine Cancer-30 years ago with hysterectomy.  . Dementia   . Hyperlipemia   . Hypocalcemia   . Major  neurocognitive disorder, unspecified 08/25/2014  . Seizures [facility])    Social History   Social History  . Marital status: Widowed    Spouse name: N/A  . Number of children: N/A  . Years of education: N/A   Social History Main Topics  . Smoking status: Current Every Day Smoker    Packs/day: 1.00    Types: Cigarettes  . Smokeless tobacco: Never Used  . Alcohol use No  . Drug use: No  . Sexual activity: Not Currently   Other Topics Concern  . None   Social History Narrative  . None   Family History  Problem Relation Age of Onset  . Cancer Mother        breast  . Cancer Father        prostate   Scheduled Meds: . ARIPiprazole  1 mg Oral Daily  . Chlorhexidine Gluconate Cloth  6 each Topical Q0600  . dexamethasone  4 mg Intravenous Q6H  . docusate sodium  200 mg Oral BID  . feeding supplement (ENSURE ENLIVE)  237 mL Oral TID  . heparin  5,000 Units Subcutaneous Q8H  . levETIRAcetam  500 mg Oral TID  . memantine  10 mg Oral BID  . mirtazapine  15 mg Oral QHS  . multivitamin with minerals  1 tablet Oral Daily  . mupirocin ointment  1 application Nasal BID  . sertraline  100 mg Oral Daily  . sodium chloride flush  3 mL Intravenous Q12H  . Vitamin D (Ergocalciferol)  50,000 Units Oral Q7 days   Continuous Infusions: PRN Meds:.acetaminophen **OR** acetaminophen, albuterol, HYDROcodone-acetaminophen, promethazine, sodium chloride flush Medications Prior to Admission:  Prior to Admission medications   Medication Sig Start Date End Date Taking? Authorizing Provider  acetaminophen (TYLENOL) 500 MG tablet Take 500 mg by mouth every 6 (six) hours as needed for fever or headache.    Yes [provider]  albuterol (PROVENTIL HFA;VENTOLIN HFA) 108 (90 Base) MCG/ACT inhaler Inhale 2 puffs into the lungs every 6 (six) hours as needed for wheezing or shortness of breath. 10/03/16  Yes Darel Hong, MD  ARIPiprazole (ABILIFY) 2 MG tablet Take 1 mg by mouth daily.   Yes  [provider]  Cholecalciferol (VITAMIN D3) 50000 units CAPS Take by mouth.   Yes [provider]  docusate sodium (COLACE) 100 MG capsule Take 2 capsules (200 mg total) by mouth 2 (two) times daily. 02/14/15  Yes Dustin Flock, MD  fluticasone (FLONASE) 50 MCG/ACT nasal spray Place 1 spray into both nostrils 2 (two) times daily.   Yes [provider]  lactose free nutrition (BOOST) LIQD Take 237 mLs by mouth 3 (three) times daily between meals.   Yes [provider]  levETIRAcetam (KEPPRA) 500 MG tablet Take 1 tablet (500 mg total) by mouth 2 (two) times daily. Patient taking differently: Take 500 mg by mouth 3 (three)  times daily.  02/14/15  Yes Dustin Flock, MD  memantine (NAMENDA) 10 MG tablet Take 10 mg by mouth 2 (two) times daily.   Yes [provider]  promethazine (PHENERGAN) 25 MG tablet Take 25 mg by mouth every 6 (six) hours as needed for nausea or vomiting.   Yes [provider]  sertraline (ZOLOFT) 100 MG tablet Take 100 mg by mouth daily.   Yes [provider]  Spacer/Aero Chamber Mouthpiece MISC 1 Units by Does not apply route every 4 (four) hours as needed (wheezing). 10/03/16  Yes Darel Hong, MD  Vitamin D, Ergocalciferol, (DRISDOL) 50000 units CAPS capsule Take 50,000 Units by mouth every 7 (seven) days.   Yes [provider]   Allergies  Allergen Reactions  . Penicillins Other (See Comments)    Reaction: unknown  Patient cannot answer follow-up questions   Review of Systems  Physical Exam  Constitutional:  frail  Pulmonary/Chest: Effort normal.  Neurological: She is alert.  Psychiatric: She has a normal mood and affect. Her behavior is normal.  pleasant  Nursing note and vitals reviewed.   Vital Signs: BP (!) 128/45 (BP Location: Left Arm)   Pulse (!) 59   Temp (!) 97.5 F (36.4 C) (Oral)   Resp 18   Ht 5' (1.524 m)   Wt 38.3 kg (84 lb 8 oz)   SpO2 99%   BMI 16.50 kg/m  Pain  Assessment: No/denies pain   Pain Score: 0-No pain   SpO2: SpO2: 99 % O2 Device:SpO2: 99 % O2 Flow Rate: .O2 Flow Rate (L/min): 3 L/min  IO: Intake/output summary:  Intake/Output Summary (Last 24 hours) at 10/18/16 0828 Last data filed at 10/17/16 1330  Gross per 24 hour  Intake              480 ml  Output                0 ml  Net              480 ml    LBM: Last BM Date: 10/16/16 Baseline Weight: Weight: 40.2 kg (88 lb 9.6 oz) Most recent weight: Weight: 38.3 kg (84 lb 8 oz)     Palliative Assessment/Data:     Thank you for this consult. Palliative medicine will continue to follow and assist as needed.   Time In: 1400 Time Out: 1520 Time Total: 80 minutes Greater than 50%  of this time was spent counseling and coordinating care related to the above assessment and plan.  Signed by: [name], AGNP-C Palliative Medicine    Please contact Palliative Medicine Team phone at [phone] for questions and concerns.  For individual provider: See Shea Evans

## 2016-10-18 NOTE — Progress Notes (Signed)
lovenox 30mg  was increased to 40mg  daily due to crcl >82ml/min  Clemencia Helzer D Shantaya Bluestone, Pharm.D, BCPS Clinical Pharmacist

## 2016-10-18 NOTE — Care Management Important Message (Signed)
Important Message  Patient Details  Name: Traci Holmes MRN: [id number] Date of Birth: Feb 25, 1945   Medicare Important Message Given:  Yes    Shelbie Ammons, RN 10/18/2016, 8:09 AM

## 2016-10-18 NOTE — Progress Notes (Signed)
Physical Therapy Treatment Patient Details Name: Traci Holmes MRN: [id number] DOB: October 02, 1945 Today's Date: 10/18/2016    History of Present Illness Traci Holmes is a 71yo white female who comes to [facility] on 8/24 after being found c LOC in shower at [facility]. Imaging revealing of Rt Rib 8 fracture. Pt recently with Lung biopsy result not yet back, MRI this admission consistent with Brain mets presumtive LungCA. PMH: seizures, alzheimer's dementia, HLD. AMS at baseline per son.     PT Comments    Pt agreeable to PT; reports pain in R flank/back especially with cough. Pt found on room air (self removed) with O2 saturation level 90% with improvement to 92-93% with activity throughout session; CM, SW and nursing aware. Pt progressing bed mobility and transfers to Mod I and Min guard respectively. Requires safety cues for sit to stand transfer. Ambulation requires Min guard and safety cues especially with turning; poor safety awareness with use of rolling walker without loss of balance. Pt receives up in chair and participates in seated and long sit exercises without physical assist. At the time of this document, pt has current discharge to skilled nursing facility to continue rehab efforts.   Follow Up Recommendations  SNF     Equipment Recommendations  None recommended by PT    Recommendations for Other Services       Precautions / Restrictions Precautions Precautions: Fall Precaution Comments: history of falls/seizures, LOC PTA with fall in shower; Monitor O2 Restrictions Weight Bearing Restrictions: No    Mobility  Bed Mobility Overal bed mobility: Modified Independent Bed Mobility: Supine to Sit     Supine to sit: Modified independent (Device/Increase time)     General bed mobility comments: use of rails and increased time  Transfers Overall transfer level: Needs assistance Equipment used: Rolling walker (2 wheeled) Transfers: Sit to/from Stand Sit to Stand: Min  guard         General transfer comment: cues for safe hand placement  Ambulation/Gait Ambulation/Gait assistance: Min guard;Min assist Ambulation Distance (Feet): 35 Feet Assistive device: Rolling walker (2 wheeled) Gait Pattern/deviations: Step-through pattern;Decreased step length - right;Trunk flexed Gait velocity: decreased Gait velocity interpretation: <1.8 ft/sec, indicative of risk for recurrent falls General Gait Details: Poor safety awareness with turns stepping outside base of rw and rw too far out in front. No LOB, but increased assist required for safety   Stairs            Wheelchair Mobility    Modified Rankin (Stroke Patients Only)       Balance Overall balance assessment: Needs assistance;History of Falls Sitting-balance support: Feet supported Sitting balance-Leahy Scale: Fair     Standing balance support: Bilateral upper extremity supported Standing balance-Leahy Scale: Fair                              Cognition Arousal/Alertness: Awake/alert Behavior During Therapy: WFL for tasks assessed/performed Overall Cognitive Status: Within Functional Limits for tasks assessed                                        Exercises General Exercises - Lower Extremity Quad Sets: Strengthening;Both;10 reps Gluteal Sets: Strengthening;Both;10 reps Long Arc Quad: AROM;Both;10 reps;Seated Heel Slides: AROM;Both;10 reps;Seated Hip ABduction/ADduction: AROM;Both;10 reps;Seated Hip Flexion/Marching: AROM;Both;10 reps;Seated Toe Raises: AROM;Both;10 reps;Seated Heel Raises: AROM;Both;10 reps;Seated  General Comments        Pertinent Vitals/Pain Pain Assessment: 0-10 Pain Score: 8  Pain Location: R flank (with cough) Pain Intervention(s): Monitored during session    Home Living                      Prior Function            PT Goals (current goals can now be found in the care plan section) Progress towards PT  goals: Progressing toward goals    Frequency    Min 2X/week      PT Plan Current plan remains appropriate    Co-evaluation              AM-PAC PT "6 Clicks" Daily Activity  Outcome Measure  Difficulty turning over in bed (including adjusting bedclothes, sheets and blankets)?: A Little Difficulty moving from lying on back to sitting on the side of the bed? : A Little Difficulty sitting down on and standing up from a chair with arms (e.g., wheelchair, bedside commode, etc,.)?: Unable Help needed moving to and from a bed to chair (including a wheelchair)?: A Little Help needed walking in hospital room?: A Little Help needed climbing 3-5 steps with a railing? : A Lot 6 Click Score: 15    End of Session Equipment Utilized During Treatment: Gait belt Activity Tolerance: Patient tolerated treatment well Patient left: in chair;with call bell/phone within reach;with chair alarm set Nurse Communication: Other (comment) (SW regarding O2 saturation) PT Visit Diagnosis: Muscle weakness (generalized) (M62.81);Other symptoms and signs involving the nervous system (R29.898);History of falling (Z91.81);Pain Pain - Right/Left: Right     Time: 7782-4235 PT Time Calculation (min) (ACUTE ONLY): 24 min  Charges:  $Gait Training: 8-22 mins $Therapeutic Exercise: 8-22 mins                    G Codes:        Larae Grooms, PTA 10/18/2016, 3:09 PM

## 2016-10-18 NOTE — Progress Notes (Addendum)
Pt is being discharged to [facility]. AVS given and explained to the Director of [facility], [place], verbalized understanding. RX and radiation schedule placed in discharge packet. Pt transported by [employer] transportation.

## 2016-10-18 NOTE — Discharge Instructions (Signed)
Regular diet. °Activity as tolerated. °

## 2016-10-18 NOTE — Discharge Summary (Signed)
[place] [facility] [place] NAME: Traci Holmes    MR#:  [id number]  DATE OF BIRTH:  Jul 29, 1945  DATE OF ADMISSION:  10/14/2016 ADMITTING PHYSICIAN: Baxter Hire, MD  DATE OF DISCHARGE: 10/18/2016  PRIMARY CARE PHYSICIAN: Raelyn Number, MD   ADMISSION DIAGNOSIS:  Confusion [R41.0] Metastatic cancer ([place]) [C79.9] Vasogenic cerebral edema ([place]) [G93.6] Multiple falls [R29.6] Closed fracture of one rib of right side, initial encounter [S22.31XA]  DISCHARGE DIAGNOSIS:  Active Problems:   Encephalopathy   Metastatic cancer ([place])   Closed fracture of one rib of right side   Goals of care, counseling/discussion   Palliative care by specialist   Advance care planning   SECONDARY DIAGNOSIS:   Past Medical History:  Diagnosis Date  . Alzheimer disease   . Aphasia   . Cancer [facility])    Uterine Cancer-30 years ago with hysterectomy.  . Dementia   . Hyperlipemia   . Hypocalcemia   . Major neurocognitive disorder, unspecified 08/25/2014  . Seizures ([place])      ADMITTING HISTORY  Chief Complaint: Fall HPI: This is a 71 year old female who recently had a right upper lobe lung mass discovered. She underwent lymph node biopsy on the 22nd at Dr. Angie Fava office. She lives in a care home. Today she was found passed out in the shower. She was very confused more than usual afterwards. Presents to the ER here and she still confused. She does remember getting a biopsy or any diagnosis. I did speak with her son by phone. He confirms that she does have dementia and is confused at baseline.  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 On IV Decadron. Radiation scheduled for 9/4 and 10/26/2016. Will need follow-up with Dr. Mike Gip at the cancer center.  * Right eighth rib  fracture with acute hypoxic resp failure. Pain control. Incentive spirometer. Oxygen. Wean as tolerated Tramadol and lidocaine patch  * Acute encephalopathy over dementia Improved  * Seizure disorder. She is on Keppra .continue current dose.  * Dementia.   * COPD. We'll continue her inhalers. No sign of exacerbation  Patient is stable for discharge back to assisted living facility with palliative care following. She will need follow-up at [facility] for radiation treatments. Has poor prognosis.  CONSULTS OBTAINED:  Treatment Team:  Noreene Filbert, MD  DRUG ALLERGIES:   Allergies  Allergen Reactions  . Penicillins Other (See Comments)    Reaction: unknown  Patient cannot answer follow-up questions    DISCHARGE MEDICATIONS:   Current Discharge Medication List    START taking these medications   Details  dexamethasone (DECADRON) 4 MG tablet Take 1 tablet (4 mg total) by mouth 3 (three) times daily. Qty: 30 tablet, Refills: 0    lidocaine (LIDODERM) 5 % Place 1 patch onto the skin daily. Remove & Discard patch within 12 hours or as directed by MD Qty: 15 patch, Refills: 0    traMADol (ULTRAM) 50 MG tablet Take 1 tablet (50 mg total) by mouth every 6 (six) hours as needed. Qty: 20 tablet, Refills: 0      CONTINUE these medications which have NOT CHANGED   Details  acetaminophen (TYLENOL) 500 MG tablet Take 500 mg by mouth every 6 (six) hours as needed for fever or headache.     albuterol (PROVENTIL HFA;VENTOLIN HFA) 108 (90  Base) MCG/ACT inhaler Inhale 2 puffs into the lungs every 6 (six) hours as needed for wheezing or shortness of breath. Qty: 1 Inhaler, Refills: 0    ARIPiprazole (ABILIFY) 2 MG tablet Take 1 mg by mouth daily.    Cholecalciferol (VITAMIN D3) 50000 units CAPS Take by mouth.    docusate sodium (COLACE) 100 MG capsule Take 2 capsules (200 mg total) by mouth 2 (two) times daily. Qty: 10 capsule, Refills: 0    fluticasone (FLONASE) 50  MCG/ACT nasal spray Place 1 spray into both nostrils 2 (two) times daily.    lactose free nutrition (BOOST) LIQD Take 237 mLs by mouth 3 (three) times daily between meals.    levETIRAcetam (KEPPRA) 500 MG tablet Take 1 tablet (500 mg total) by mouth 2 (two) times daily. Qty: 10 tablet, Refills: 0    memantine (NAMENDA) 10 MG tablet Take 10 mg by mouth 2 (two) times daily.    promethazine (PHENERGAN) 25 MG tablet Take 25 mg by mouth every 6 (six) hours as needed for nausea or vomiting.    sertraline (ZOLOFT) 100 MG tablet Take 100 mg by mouth daily.    Spacer/Aero Chamber Mouthpiece MISC 1 Units by Does not apply route every 4 (four) hours as needed (wheezing). Qty: 1 each, Refills: 0    Vitamin D, Ergocalciferol, (DRISDOL) 50000 units CAPS capsule Take 50,000 Units by mouth every 7 (seven) days.      STOP taking these medications     mirtazapine (REMERON) 7.5 MG tablet         Today   VITAL SIGNS:  Blood pressure (!) 123/55, pulse 64, temperature (!) 97.5 F (36.4 C), temperature source Oral, resp. rate 18, height 5' (1.524 m), weight 38.3 kg (84 lb 8 oz), SpO2 100 %.  I/O:  No intake or output data in the 24 hours ending 10/18/16 1436  PHYSICAL EXAMINATION:  Physical Exam  GENERAL:  71 y.o.-year-old patient lying in the bed with no acute distress.  LUNGS: Normal breath sounds bilaterally, no wheezing, rales,rhonchi or crepitation. No use of accessory muscles of respiration.  CARDIOVASCULAR: S1, S2 normal. No murmurs, rubs, or gallops.  ABDOMEN: Soft, non-tender, non-distended. Bowel sounds present. No organomegaly or mass.  NEUROLOGIC: Moves all 4 extremities. PSYCHIATRIC: The patient is alert and awake. Pleasantly confused SKIN: No obvious rash, lesion, or ulcer.   DATA REVIEW:   CBC  Recent Labs Lab 10/18/16 0439  WBC 13.1*  HGB 14.3  HCT 42.8  PLT 224    Chemistries   Recent Labs Lab 10/14/16 1715 10/15/16 0430  NA 140 136  K 3.7 4.2  CL 103 101   CO2 28 27  GLUCOSE 88 161*  BUN 13 14  CREATININE 0.45 0.44  CALCIUM 8.4* 9.4  AST 33  --   ALT 12*  --   ALKPHOS 64  --   BILITOT 0.3  --     Cardiac Enzymes No results for input(s): TROPONINI in the last 168 hours.  Microbiology Results  Results for orders placed or performed during the hospital encounter of 10/14/16  MRSA PCR Screening     Status: Abnormal   Collection Time: 10/14/16  7:34 PM  Result Value Ref Range Status   MRSA by PCR POSITIVE (A) NEGATIVE Final    Comment:        The GeneXpert MRSA Assay (FDA approved for NASAL specimens only), is one component of a comprehensive MRSA colonization surveillance program. It is not intended to diagnose MRSA  infection nor to guide or monitor treatment for MRSA infections. RESULT CALLED TO, READ BACK BY AND VERIFIED WITH: JACKIE PAGE ON 10/14/16 AT 2317 QSD     RADIOLOGY:  No results found.  Follow up with PCP in 1 week.  Management plans discussed with the patient, family and they are in agreement.  CODE STATUS:     Code Status Orders        Start     Ordered   10/14/16 1932  Full code  Continuous     10/14/16 1931    Code Status History    Date Active Date Inactive Code Status Order ID Comments User Context   02/08/2015  4:29 AM 02/14/2015  1:37 PM Full Code [id number]  Harrie Foreman, MD Inpatient      TOTAL TIME TAKING CARE OF THIS PATIENT ON DAY OF DISCHARGE: more than 30 minutes.   Hillary Bow R M.D on 10/18/2016 at 2:36 PM  Between 7am to 6pm - Pager - [phone]  After 6pm go to [email or web address] - password [email or web address] [facility]  Office  [phone]  CC: Primary care physician; Raelyn Number, MD  Note: This dictation was prepared with Dragon dictation along with smaller phrase technology. Any transcriptional errors that result from this process are unintentional.

## 2016-10-18 NOTE — Progress Notes (Signed)
[facility] [place] NAME: Traci Holmes    MR#:  [id number]  DATE OF BIRTH:  05-May-1945  SUBJECTIVE:  CHIEF COMPLAINT:   Chief Complaint  Patient presents with  . Fall   Pleasant. Still has some pain right chest. Went to radiation oncology earlier today  REVIEW OF SYSTEMS:    Review of Systems  Constitutional: Negative for chills and fever.  HENT: Negative for sore throat.   Eyes: Negative for blurred vision, double vision and pain.  Respiratory: Positive for cough. Negative for hemoptysis, shortness of breath and wheezing.   Cardiovascular: Positive for chest pain. Negative for palpitations, orthopnea and leg swelling.  Gastrointestinal: Negative for abdominal pain, constipation, diarrhea, heartburn, nausea and vomiting.  Genitourinary: Negative for dysuria and hematuria.  Musculoskeletal: Negative for back pain and joint pain.  Skin: Negative for rash.  Neurological: Negative for sensory change, speech change, focal weakness and headaches.  Endo/Heme/Allergies: Does not bruise/bleed easily.  Psychiatric/Behavioral: Positive for memory loss. Negative for depression. The patient is not nervous/anxious.    DRUG ALLERGIES:   Allergies  Allergen Reactions  . Penicillins Other (See Comments)    Reaction: unknown  Patient cannot answer follow-up questions    VITALS:  Blood pressure (!) 123/55, pulse 64, temperature (!) 97.5 F (36.4 C), temperature source Oral, resp. rate 18, height 5' (1.524 m), weight 38.3 kg (84 lb 8 oz), SpO2 100 %.  PHYSICAL EXAMINATION:   Physical Exam  GENERAL:  71 y.o.-year-old patient lying in the bed with no acute distress.  EYES: Pupils equal, round, reactive to light and accommodation. No scleral icterus. Extraocular muscles intact.  HEENT: Head atraumatic, normocephalic. Oropharynx and nasopharynx clear.  NECK:  Supple, no jugular venous distention. No thyroid enlargement, no tenderness.  LUNGS:  Normal breath sounds bilaterally, no wheezing, rales, rhonchi. No use of accessory muscles of respiration. Right chest wall tenderness CARDIOVASCULAR: S1, S2 normal. No murmurs, rubs, or gallops.  ABDOMEN: Soft, nontender, nondistended. Bowel sounds present. No organomegaly or mass.  EXTREMITIES: No cyanosis, clubbing or edema b/l.    NEUROLOGIC: Cranial nerves II through XII are intact. No focal Motor or sensory deficits b/l.   PSYCHIATRIC: The patient is alert and awake SKIN: No obvious rash, lesion, or ulcer.   LABORATORY PANEL:   CBC  Recent Labs Lab 10/18/16 0439  WBC 13.1*  HGB 14.3  HCT 42.8  PLT 224   ------------------------------------------------------------------------------------------------------------------ Chemistries   Recent Labs Lab 10/14/16 1715 10/15/16 0430  NA 140 136  K 3.7 4.2  CL 103 101  CO2 28 27  GLUCOSE 88 161*  BUN 13 14  CREATININE 0.45 0.44  CALCIUM 8.4* 9.4  AST 33  --   ALT 12*  --   ALKPHOS 64  --   BILITOT 0.3  --    ------------------------------------------------------------------------------------------------------------------  Cardiac Enzymes No results for input(s): TROPONINI in the last 168 hours. ------------------------------------------------------------------------------------------------------------------  RADIOLOGY:  No results found.   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 On IV Decadron. Radiation scheduled for 9/3 and 10/25/2016  * Right eighth rib fracture with acute hypoxic resp failure. Pain control. Incentive spirometer. Oxygen. Wean as tolerated  * Acute encephalopathy over dementia Improved  * Seizure disorder. She is on Keppra will continue current dose.  * Dementia.  Monitor for inpatient delirium Patient unable  to  make her own decisions.  * COPD. We'll continue her inhalers. No sign of exacerbation  * Severe protein calorie malnutrition.  Supplements added  All the records are reviewed and case discussed with Care Management/Social Worker Management plans discussed with the patient, family and they are in agreement.  CODE STATUS: FULL CODE  DVT Prophylaxis: SCDs  TOTAL TIME TAKING CARE OF THIS PATIENT: 30 minutes.   POSSIBLE D/C IN 1-2 DAYS, DEPENDING ON CLINICAL CONDITION.  Hillary Bow R M.D on 10/18/2016 at 12:41 PM  Between 7am to 6pm - Pager - [phone]  After 6pm go to [email or web address] - password [email or web address] [facility]  Office  [phone]  CC: Primary care physician; Raelyn Number, MD  Note: This dictation was prepared with Dragon dictation along with smaller phrase technology. Any transcriptional errors that result from this process are unintentional.

## 2016-10-18 NOTE — NC FL2 (Addendum)
[place] LEVEL OF CARE SCREENING TOOL     IDENTIFICATION  Patient Name: Traci Holmes Birthdate: 05-Mar-1945 Sex: female Admission Date (Current Location): 10/14/2016  [place] and [place] Number:  Engineering geologist and Address:  [facility], [address] [place], [place] [address]      Provider Number: [id number]  Attending Physician Name and Address:  Hillary Bow, MD  Relative Name and Phone Number:     Antionette Poles [phone]       Current Level of Care: Hospital Recommended Level of Care: [facility] Care) Prior Approval Number:    Date Approved/Denied:   PASRR Number: [id number]  Discharge Plan: Domiciliary (Rest home) ([facility])    Current Diagnoses: Patient Active Problem List   Diagnosis Date Noted  . Metastatic cancer ([place])   . Closed fracture of one rib of right side   . Goals of care, counseling/discussion   . Palliative care by specialist   . Advance care planning   . Encephalopathy 10/14/2016  . Altered mental status   . Elevated troponin   . Seizure ([place])   . Bradycardia   . Demand ischemia ([place])   . Protein-calorie malnutrition, severe 02/09/2015  . Status epilepticus ([place]) 02/08/2015  . Dementia, old age   . Major neurocognitive disorder, unspecified 08/25/2014    Orientation RESPIRATION BLADDER Height & Weight     Self, Place  Room air Incontinent Weight: 84 lb 8 oz (38.3 kg) Height:  5' (152.4 cm)  BEHAVIORAL SYMPTOMS/MOOD NEUROLOGICAL BOWEL NUTRITION STATUS    Convulsions/Seizures Continent Diet (Regular diet)  AMBULATORY STATUS COMMUNICATION OF NEEDS Skin   Limited Assist Verbally Bruising                       Personal Care Assistance Level of Assistance  Bathing, Feeding, Dressing Bathing Assistance: Limited assistance Feeding assistance: Limited assistance Dressing Assistance: Limited assistance      Functional Limitations Info  Sight, Hearing, Speech Sight Info: Adequate Hearing Info: Adequate Speech Info: Adequate    SPECIAL CARE FACTORS FREQUENCY  PT (By licensed PT)     PT Frequency: minimum 2x a week home health PT              Contractures Contractures Info: Not present    Additional Factors Info  Code Status, Allergies, Psychotropic Code Status Info: Full Code Allergies Info: Penicillins Psychotropic Info: ARIPiprazole (ABILIFY) tablet 1 mg, memantine (NAMENDA) tablet 10 mg, sertraline (ZOLOFT) tablet 100 mg,  mirtazapine (REMERON) tablet 15 mg         Current Medications (10/18/2016):  This is the current hospital active medication list Current Facility-Administered Medications  Medication Dose Route Frequency Provider Last Rate Last Dose  . acetaminophen (TYLENOL) tablet 650 mg  650 mg Oral Q6H PRN Baxter Hire, MD   650 mg at 10/17/16 1016   Or  . acetaminophen (TYLENOL) suppository 650 mg  650 mg Rectal Q6H PRN Baxter Hire, MD      . albuterol (PROVENTIL) (2.5 MG/3ML) 0.083% nebulizer solution 2.5 mg  2.5 mg Inhalation Q6H PRN Baxter Hire, MD      . ARIPiprazole (ABILIFY) tablet 1 mg  1 mg Oral Daily Baxter Hire, MD   1 mg at 10/18/16 1008  . Chlorhexidine Gluconate Cloth 2 % PADS 6 each  6 each Topical Q0600 Baxter Hire, MD   6 each at 10/18/16 [phone]  .  dexamethasone (DECADRON) injection 4 mg  4 mg Intravenous Q6H Corcoran, Melissa C, MD   4 mg at 10/18/16 1310  . docusate sodium (COLACE) capsule 200 mg  200 mg Oral BID Baxter Hire, MD   200 mg at 10/18/16 1007  . enoxaparin (LOVENOX) injection 30 mg  30 mg Subcutaneous Q24H Sudini, Srikar, MD      . feeding supplement (ENSURE ENLIVE) (ENSURE ENLIVE) liquid 237 mL  237 mL Oral TID Baxter Hire, MD   237 mL at 10/18/16 1007  . HYDROcodone-acetaminophen (NORCO/VICODIN) 5-325 MG per tablet 1 tablet  1 tablet Oral Q6H PRN Hillary Bow, MD   1 tablet at 10/17/16 1125  .  levETIRAcetam (KEPPRA) tablet 500 mg  500 mg Oral TID Baxter Hire, MD   500 mg at 10/18/16 1008  . lidocaine (LIDODERM) 5 % 1 patch  1 patch Transdermal Q24H Earlie Counts, NP      . memantine (NAMENDA) tablet 10 mg  10 mg Oral BID Baxter Hire, MD   10 mg at 10/18/16 1008  . mirtazapine (REMERON) tablet 15 mg  15 mg Oral QHS Baxter Hire, MD   15 mg at 10/17/16 2012  . multivitamin with minerals tablet 1 tablet  1 tablet Oral Daily Hillary Bow, MD   1 tablet at 10/18/16 1008  . mupirocin ointment (BACTROBAN) 2 % 1 application  1 application Nasal BID Baxter Hire, MD   1 application at 22/29/79 1014  . promethazine (PHENERGAN) tablet 25 mg  25 mg Oral Q6H PRN Baxter Hire, MD      . sertraline (ZOLOFT) tablet 100 mg  100 mg Oral Daily Baxter Hire, MD   100 mg at 10/18/16 1008  . sodium chloride flush (NS) 0.9 % injection 3 mL  3 mL Intravenous Q12H Baxter Hire, MD   3 mL at 10/18/16 1014  . sodium chloride flush (NS) 0.9 % injection 3 mL  3 mL Intravenous PRN Baxter Hire, MD   3 mL at 10/16/16 1717  . Vitamin D (Ergocalciferol) (DRISDOL) capsule 50,000 Units  50,000 Units Oral Q7 days Baxter Hire, MD   50,000 Units at 10/14/16 2140     Discharge Medications: Please see discharge summary for a list of discharge medications.  Current Discharge Medication List        START taking these medications   Details  dexamethasone (DECADRON) 4 MG tablet Take 1 tablet (4 mg total) by mouth 3 (three) times daily. Qty: 30 tablet, Refills: 0    lidocaine (LIDODERM) 5 % Place 1 patch onto the skin daily. Remove & Discard patch within 12 hours or as directed by MD Qty: 15 patch, Refills: 0    traMADol (ULTRAM) 50 MG tablet Take 1 tablet (50 mg total) by mouth every 6 (six) hours as needed. Qty: 20 tablet, Refills: 0          CONTINUE these medications which have NOT CHANGED   Details  acetaminophen (TYLENOL) 500 MG tablet Take 500 mg by mouth  every 6 (six) hours as needed for fever or headache.     albuterol (PROVENTIL HFA;VENTOLIN HFA) 108 (90 Base) MCG/ACT inhaler Inhale 2 puffs into the lungs every 6 (six) hours as needed for wheezing or shortness of breath. Qty: 1 Inhaler, Refills: 0    ARIPiprazole (ABILIFY) 2 MG tablet Take 1 mg by mouth daily.    Cholecalciferol (VITAMIN D3) 50000 units CAPS Take by mouth.  docusate sodium (COLACE) 100 MG capsule Take 2 capsules (200 mg total) by mouth 2 (two) times daily. Qty: 10 capsule, Refills: 0    fluticasone (FLONASE) 50 MCG/ACT nasal spray Place 1 spray into both nostrils 2 (two) times daily.    lactose free nutrition (BOOST) LIQD Take 237 mLs by mouth 3 (three) times daily between meals.    levETIRAcetam (KEPPRA) 500 MG tablet Take 1 tablet (500 mg total) by mouth 2 (two) times daily. Qty: 10 tablet, Refills: 0    memantine (NAMENDA) 10 MG tablet Take 10 mg by mouth 2 (two) times daily.    promethazine (PHENERGAN) 25 MG tablet Take 25 mg by mouth every 6 (six) hours as needed for nausea or vomiting.    sertraline (ZOLOFT) 100 MG tablet Take 100 mg by mouth daily.    Spacer/Aero Chamber Mouthpiece MISC 1 Units by Does not apply route every 4 (four) hours as needed (wheezing). Qty: 1 each, Refills: 0    Vitamin D, Ergocalciferol, (DRISDOL) 50000 units CAPS capsule Take 50,000 Units by mouth every 7 (seven) days.         STOP taking these medications     mirtazapine (REMERON) 7.5 MG tablet        Relevant Imaging Results:  Relevant Lab Results:   Additional Information [id number]  Ross Ludwig, [place]

## 2016-10-18 NOTE — Clinical Social Work Note (Signed)
Patient to be d/c'ed today to [facility] unit.  Patient and family agreeable to plans will transport via facility transportation.  Patient will be receiving home health PT at ALF, physician notified patient's son.  Evette Cristal, MSW, [place]

## 2016-10-19 ENCOUNTER — Emergency Department: Payer: Medicare Other

## 2016-10-19 ENCOUNTER — Observation Stay
Admission: EM | Admit: 2016-10-19 | Discharge: 2016-10-20 | Disposition: A | Discharge status: Home / Self Care | Payer: Medicare Other | Attending: Internal Medicine | Admitting: Internal Medicine

## 2016-10-19 DIAGNOSIS — F0280 Dementia in other diseases classified elsewhere without behavioral disturbance: Secondary | ICD-10-CM | POA: Diagnosis not present

## 2016-10-19 DIAGNOSIS — Z8042 Family history of malignant neoplasm of prostate: Secondary | ICD-10-CM | POA: Insufficient documentation

## 2016-10-19 DIAGNOSIS — E785 Hyperlipidemia, unspecified: Secondary | ICD-10-CM | POA: Insufficient documentation

## 2016-10-19 DIAGNOSIS — R52 Pain, unspecified: Secondary | ICD-10-CM | POA: Diagnosis present

## 2016-10-19 DIAGNOSIS — Z7952 Long term (current) use of systemic steroids: Secondary | ICD-10-CM | POA: Diagnosis not present

## 2016-10-19 DIAGNOSIS — Z90710 Acquired absence of both cervix and uterus: Secondary | ICD-10-CM | POA: Insufficient documentation

## 2016-10-19 DIAGNOSIS — Z880 Allergy status to penicillin: Secondary | ICD-10-CM | POA: Diagnosis not present

## 2016-10-19 DIAGNOSIS — Z9181 History of falling: Secondary | ICD-10-CM | POA: Diagnosis not present

## 2016-10-19 DIAGNOSIS — Z66 Do not resuscitate: Secondary | ICD-10-CM | POA: Insufficient documentation

## 2016-10-19 DIAGNOSIS — Y92092 Bedroom in other non-institutional residence as the place of occurrence of the external cause: Secondary | ICD-10-CM | POA: Diagnosis not present

## 2016-10-19 DIAGNOSIS — R0789 Other chest pain: Secondary | ICD-10-CM | POA: Diagnosis present

## 2016-10-19 DIAGNOSIS — Z8542 Personal history of malignant neoplasm of other parts of uterus: Secondary | ICD-10-CM | POA: Diagnosis not present

## 2016-10-19 DIAGNOSIS — R569 Unspecified convulsions: Secondary | ICD-10-CM | POA: Diagnosis not present

## 2016-10-19 DIAGNOSIS — J449 Chronic obstructive pulmonary disease, unspecified: Secondary | ICD-10-CM | POA: Diagnosis not present

## 2016-10-19 DIAGNOSIS — Z803 Family history of malignant neoplasm of breast: Secondary | ICD-10-CM | POA: Diagnosis not present

## 2016-10-19 DIAGNOSIS — W1830XA Fall on same level, unspecified, initial encounter: Secondary | ICD-10-CM | POA: Insufficient documentation

## 2016-10-19 DIAGNOSIS — C3490 Malignant neoplasm of unspecified part of unspecified bronchus or lung: Secondary | ICD-10-CM | POA: Diagnosis not present

## 2016-10-19 DIAGNOSIS — Z79899 Other long term (current) drug therapy: Secondary | ICD-10-CM | POA: Insufficient documentation

## 2016-10-19 DIAGNOSIS — G309 Alzheimer's disease, unspecified: Secondary | ICD-10-CM | POA: Insufficient documentation

## 2016-10-19 DIAGNOSIS — C7931 Secondary malignant neoplasm of brain: Secondary | ICD-10-CM | POA: Insufficient documentation

## 2016-10-19 DIAGNOSIS — C55 Malignant neoplasm of uterus, part unspecified: Secondary | ICD-10-CM

## 2016-10-19 DIAGNOSIS — J9601 Acute respiratory failure with hypoxia: Secondary | ICD-10-CM | POA: Diagnosis not present

## 2016-10-19 DIAGNOSIS — S2241XA Multiple fractures of ribs, right side, initial encounter for closed fracture: Secondary | ICD-10-CM | POA: Diagnosis not present

## 2016-10-19 LAB — BASIC METABOLIC PANEL
ANION GAP: 10 (ref 5–15)
BUN: 31 mg/dL — AB (ref 6–20)
CALCIUM: 9.5 mg/dL (ref 8.9–10.3)
CO2: 29 mmol/L (ref 22–32)
Chloride: 95 mmol/L — ABNORMAL LOW (ref 101–111)
Creatinine, Ser: 0.62 mg/dL (ref 0.44–1.00)
GFR calc Af Amer: >60 mL/min (ref >60)
GLUCOSE: 131 mg/dL — AB (ref 65–99)
POTASSIUM: 4.6 mmol/L (ref 3.5–5.1)
SODIUM: 134 mmol/L — AB (ref 135–145)

## 2016-10-19 LAB — CBC
HCT: 42.8 % (ref 35.0–47.0)
HEMOGLOBIN: 14.2 g/dL (ref 12.0–16.0)
MCH: 31.5 pg (ref 26.0–34.0)
MCHC: 33.2 g/dL (ref 32.0–36.0)
MCV: 94.8 fL (ref 80.0–100.0)
Platelets: 248 10*3/uL (ref 150–440)
RBC: 4.51 MIL/uL (ref 3.80–5.20)
RDW: 13.8 % (ref 11.5–14.5)
WBC: 13.4 10*3/uL — AB (ref 3.6–11.0)

## 2016-10-19 MED ORDER — MEMANTINE HCL 5 MG PO TABS
10.0000 mg | ORAL_TABLET | Freq: Two times a day (BID) | ORAL | Status: DC
  Administered 2016-10-20: 10 mg via ORAL
  Filled 2016-10-19: qty 2

## 2016-10-19 MED ORDER — ALBUTEROL SULFATE (2.5 MG/3ML) 0.083% IN NEBU: 2.5000 mg | INHALATION_SOLUTION | Freq: Four times a day (QID) | RESPIRATORY_TRACT | Status: DC | PRN

## 2016-10-19 MED ORDER — SENNOSIDES-DOCUSATE SODIUM 8.6-50 MG PO TABS: 1.0000 | ORAL_TABLET | Freq: Every evening | ORAL | Status: DC | PRN

## 2016-10-19 MED ORDER — BISACODYL 5 MG PO TBEC: 5.0000 mg | DELAYED_RELEASE_TABLET | Freq: Every day | ORAL | Status: DC | PRN

## 2016-10-19 MED ORDER — TRAMADOL HCL 50 MG PO TABS: 50.0000 mg | ORAL_TABLET | Freq: Four times a day (QID) | ORAL | Status: DC | PRN

## 2016-10-19 MED ORDER — ACETAMINOPHEN 650 MG RE SUPP: 650.0000 mg | Freq: Four times a day (QID) | RECTAL | Status: DC | PRN

## 2016-10-19 MED ORDER — ENOXAPARIN SODIUM 40 MG/0.4ML ~~LOC~~ SOLN: 40.0000 mg | SUBCUTANEOUS | Status: DC

## 2016-10-19 MED ORDER — ACETAMINOPHEN 325 MG PO TABS: 650.0000 mg | ORAL_TABLET | Freq: Four times a day (QID) | ORAL | Status: DC | PRN

## 2016-10-19 MED ORDER — OXYCODONE HCL 5 MG PO TABS
5.0000 mg | ORAL_TABLET | ORAL | Status: DC | PRN
  Administered 2016-10-20 (×2): 5 mg via ORAL
  Filled 2016-10-19 (×2): qty 1

## 2016-10-19 MED ORDER — LIDOCAINE 5 % EX PTCH
1.0000 | MEDICATED_PATCH | CUTANEOUS | Status: DC
  Administered 2016-10-20: 09:00:00 1 via TRANSDERMAL
  Filled 2016-10-19: qty 1

## 2016-10-19 MED ORDER — ONDANSETRON HCL 4 MG PO TABS: 4.0000 mg | ORAL_TABLET | Freq: Four times a day (QID) | ORAL | Status: DC | PRN

## 2016-10-19 MED ORDER — ONDANSETRON HCL 4 MG/2ML IJ SOLN: 4.0000 mg | Freq: Four times a day (QID) | INTRAMUSCULAR | Status: DC | PRN

## 2016-10-19 MED ORDER — IPRATROPIUM BROMIDE 0.02 % IN SOLN: 0.5000 mg | Freq: Four times a day (QID) | RESPIRATORY_TRACT | Status: DC | PRN

## 2016-10-19 MED ORDER — SERTRALINE HCL 50 MG PO TABS
100.0000 mg | ORAL_TABLET | Freq: Every day | ORAL | Status: DC
Start: 2016-10-20 — End: 2016-10-20
  Administered 2016-10-20: 09:00:00 100 mg via ORAL
  Filled 2016-10-19: qty 2

## 2016-10-19 MED ORDER — LEVETIRACETAM 500 MG PO TABS
500.0000 mg | ORAL_TABLET | Freq: Three times a day (TID) | ORAL | Status: DC
  Administered 2016-10-20 (×2): 500 mg via ORAL
  Filled 2016-10-19 (×3): qty 1

## 2016-10-19 MED ORDER — ARIPIPRAZOLE 2 MG PO TABS
1.0000 mg | ORAL_TABLET | Freq: Every day | ORAL | Status: DC
Start: 2016-10-20 — End: 2016-10-20
  Administered 2016-10-20: 1 mg via ORAL
  Filled 2016-10-19: qty 1

## 2016-10-19 MED ORDER — DOCUSATE SODIUM 100 MG PO CAPS
200.0000 mg | ORAL_CAPSULE | Freq: Two times a day (BID) | ORAL | Status: DC
  Administered 2016-10-20: 200 mg via ORAL
  Filled 2016-10-19: qty 2

## 2016-10-19 MED ORDER — FLUTICASONE PROPIONATE 50 MCG/ACT NA SUSP
1.0000 | Freq: Two times a day (BID) | NASAL | Status: DC
  Administered 2016-10-20: 1 via NASAL
  Filled 2016-10-19: qty 16

## 2016-10-19 MED ORDER — PROMETHAZINE HCL 25 MG PO TABS
25.0000 mg | ORAL_TABLET | Freq: Four times a day (QID) | ORAL | Status: DC | PRN
  Filled 2016-10-19: qty 1

## 2016-10-19 MED ORDER — DEXAMETHASONE 4 MG PO TABS
4.0000 mg | ORAL_TABLET | Freq: Three times a day (TID) | ORAL | Status: DC
  Administered 2016-10-20 (×2): 4 mg via ORAL
  Filled 2016-10-19 (×2): qty 1

## 2016-10-19 MED ORDER — BOOST PO LIQD
237.0000 mL | Freq: Three times a day (TID) | ORAL | Status: DC
  Administered 2016-10-20 (×2): 237 mL via ORAL
  Filled 2016-10-19 (×3): qty 237

## 2016-10-19 MED ORDER — MAGNESIUM CITRATE PO SOLN
1.0000 | Freq: Once | ORAL | Status: DC | PRN
  Filled 2016-10-19: qty 296

## 2016-10-19 NOTE — ED Provider Notes (Signed)
[facility] Emergency Department Provider Note ____________________________________________   First MD Initiated Contact with Patient 10/19/16 2007     (approximate)  I have reviewed the triage vital signs and the nursing notes.   HISTORY  Chief Complaint Fall and Chest Pain    HPI Traci Holmes is a 71 y.o. female with a hx of dementia who presents from her facility after a fall, acute onset approx 1h ago when she states she slipped and fell onto her R side.  Pt reports R side/back pain, but denies other injuries.  She denies LOC, lightheadedness or near syncope and states she remembers the whole event.  Denies hitting her head.  Denies any chest pain as was noted in triage.    Past Medical History:  Diagnosis Date  . Alzheimer disease   . Aphasia   . Cancer [facility])    Uterine Cancer-30 years ago with hysterectomy.  . Dementia   . Hyperlipemia   . Hypocalcemia   . Major neurocognitive disorder, unspecified 08/25/2014  . Seizures [facility])     Patient Active Problem List   Diagnosis Date Noted  . Intractable pain 10/19/2016  . Metastatic cancer ([place])   . Closed fracture of one rib of right side   . Goals of care, counseling/discussion   . Palliative care by specialist   . Advance care planning   . Encephalopathy 10/14/2016  . Altered mental status   . Elevated troponin   . Seizure ([place])   . Bradycardia   . Demand ischemia ([place])   . Protein-calorie malnutrition, severe 02/09/2015  . Status epilepticus ([place]) 02/08/2015  . Dementia, old age   . Major neurocognitive disorder, unspecified 08/25/2014    Past Surgical History:  Procedure Laterality Date  . ABDOMINAL HYSTERECTOMY     Does not remember date 30-40 years ago?    Prior to Admission medications   Medication Sig Start Date End Date Taking? Authorizing Provider  acetaminophen (TYLENOL) 500 MG tablet Take 500 mg by mouth every 6 (six) hours as needed for fever or headache.    Yes  [provider]  albuterol (PROVENTIL HFA;VENTOLIN HFA) 108 (90 Base) MCG/ACT inhaler Inhale 2 puffs into the lungs every 6 (six) hours as needed for wheezing or shortness of breath. 10/03/16  Yes Darel Hong, MD  ARIPiprazole (ABILIFY) 2 MG tablet Take 1 mg by mouth daily.   Yes [provider]  Cholecalciferol (VITAMIN D3) 50000 units CAPS Take by mouth.   Yes [provider]  dexamethasone (DECADRON) 4 MG tablet Take 1 tablet (4 mg total) by mouth 3 (three) times daily. 10/18/16 10/18/17 Yes Sudini, Alveta Heimlich, MD  docusate sodium (COLACE) 100 MG capsule Take 2 capsules (200 mg total) by mouth 2 (two) times daily. 02/14/15  Yes Dustin Flock, MD  fluticasone (FLONASE) 50 MCG/ACT nasal spray Place 1 spray into both nostrils 2 (two) times daily.   Yes [provider]  lactose free nutrition (BOOST) LIQD Take 237 mLs by mouth 3 (three) times daily between meals.   Yes [provider]  levETIRAcetam (KEPPRA) 500 MG tablet Take 1 tablet (500 mg total) by mouth 2 (two) times daily. Patient taking differently: Take 500 mg by mouth 3 (three) times daily.  02/14/15  Yes Dustin Flock, MD  lidocaine (LIDODERM) 5 % Place 1 patch onto the skin daily. Remove & Discard patch within 12 hours or as directed by MD 10/18/16  Yes Hillary Bow, MD  memantine (NAMENDA) 10 MG tablet  Take 10 mg by mouth 2 (two) times daily.   Yes [provider]  promethazine (PHENERGAN) 25 MG tablet Take 25 mg by mouth every 6 (six) hours as needed for nausea or vomiting.   Yes [provider]  sertraline (ZOLOFT) 100 MG tablet Take 100 mg by mouth daily.   Yes [provider]  Spacer/Aero Chamber Mouthpiece MISC 1 Units by Does not apply route every 4 (four) hours as needed (wheezing). 10/03/16  Yes Darel Hong, MD  traMADol (ULTRAM) 50 MG tablet Take 1 tablet (50 mg total) by mouth every 6 (six) hours as needed. Patient taking differently: Take 50 mg by  mouth every 6 (six) hours as needed for moderate pain.  10/18/16 10/18/17 Yes Sudini, Alveta Heimlich, MD  Vitamin D, Ergocalciferol, (DRISDOL) 50000 units CAPS capsule Take 50,000 Units by mouth every 7 (seven) days.   Yes [provider]    Allergies Penicillins  Family History  Problem Relation Age of Onset  . Cancer Mother        breast  . Cancer Father        prostate    Social History Social History  Substance Use Topics  . Smoking status: Current Every Day Smoker    Packs/day: 1.00    Types: Cigarettes  . Smokeless tobacco: Never Used  . Alcohol use No    Review of Systems  Constitutional: No fever/chills Eyes: No visual changes. ENT: No sore throat. Cardiovascular: Denies chest pain. Respiratory: Denies shortness of breath. Gastrointestinal: No nausea, no vomiting.  No diarrhea.  Genitourinary: Negative for dysuria.  Musculoskeletal: Positive for back pain. Skin: Negative for rash. Neurological: Negative for headaches, focal weakness or numbness.   ____________________________________________   PHYSICAL EXAM:  VITAL SIGNS: ED Triage Vitals  Enc Vitals Group     BP 10/19/16 2005 (!) 129/47     Pulse Rate 10/19/16 2005 60     Resp 10/19/16 2005 (!) 21     Temp 10/19/16 2005 97.8 F (36.6 C)     Temp Source 10/19/16 2005 Oral     SpO2 10/19/16 2002 95 %     Weight 10/19/16 2006 93 lb 3.2 oz (42.3 kg)     Height 10/19/16 2006 5' (1.524 m)     Head Circumference --      Peak Flow --      Pain Score 10/19/16 2004 3     Pain Loc --      Pain Edu? --      Excl. in [place]? --     Constitutional: Alert and oriented. Well appearing and in no acute distress. Eyes: Conjunctivae are normal. EOMI.  PERRLA.  Head: Atraumatic. Nose: No congestion/rhinnorhea. Mouth/Throat: Mucous membranes are moist.   Neck: Normal range of motion. Cspine nontender.  Cardiovascular: Normal rate, regular rhythm. Grossly normal heart sounds.  Good peripheral circulation. Nontender  anterior chest wall.  Respiratory: Normal respiratory effort.  No retractions. Lungs CTAB. Gastrointestinal: Soft and nontender. No distention.  Genitourinary: No CVA tenderness. Musculoskeletal: No lower extremity edema.  Extremities warm and well perfused.  R inferior posterior rib area with tenderness, no stepoff or crepitus.  FROM at all joints, no bony tenderness.  No midline spinal tenderness.  Neurologic:  Normal speech and language. No gross focal neurologic deficits are appreciated. Motor  Intact in all extremities, normal coordination.  Skin:  Skin is warm and dry. No rash noted. Psychiatric: Mood and affect are normal. Speech and behavior are normal.  ____________________________________________  LABS (all labs ordered are listed, but only abnormal results are displayed)  Labs Reviewed  BASIC METABOLIC PANEL - Abnormal; Notable for the following:       Result Value   Sodium 134 (*)    Chloride 95 (*)    Glucose, Bld 131 (*)    BUN 31 (*)    All other components within normal limits  CBC - Abnormal; Notable for the following:    WBC 13.4 (*)    All other components within normal limits  URINALYSIS, COMPLETE (UACMP) WITH MICROSCOPIC   ____________________________________________  EKG  ED ECG REPORT I, Arta Silence, the attending physician, personally viewed and interpreted this ECG.  Date: 10/19/2016 EKG Time: 2005 Rate: 56 Rhythm: normal sinus rhythm QRS Axis: normal Intervals: normal ST/T Wave abnormalities: no acute findings Narrative Interpretation: no evidence of acute ischemia  ____________________________________________  RADIOLOGY  Rib series reveals 3 contiguous minimally displaced R rib fx, 7, 8, and 9.   ____________________________________________   PROCEDURES  Procedure(s) performed: No    Critical Care performed: No ____________________________________________   INITIAL IMPRESSION / ASSESSMENT AND PLAN / ED  COURSE  Pertinent labs & imaging results that were available during my care of the patient were reviewed by me and considered in my medical decision making (see chart for details).  71 y/o F hx dementia p/w R lateral/posterior rib area pain after apparent mechanical fall.  Pt was admitted here last week for syncope, confusion and a fall however today she denies LOC and remembers the whole event - states she had a mechanical slip and fall.  Pt is a/ox3, answering questions appropriately and not confused.  Neuro exam is normal.  She has tenderness to R posterior lower ribs as noted, but exam otherwise unremarkable.  She had a R 8th rib fx on her previous visit, and the current injury is to the same area.  However at this time pt's VS are normal (except for low O2 sat which is consistent with chronic COPD), no resp distress or sob, and no e/o resp failure.  Plan: chest and R rib XRs, basic labs.  Anticipate d/c back to facility if negative.  If displaced or multiple fx may need admission.     ----------------------------------------- 9:57 PM on 10/19/2016 -----------------------------------------  Patient with 3 contiguous right rib fractures.  O2 sat is 90 on room air, improves to mid 90s on nasal cannula. Given patient's underlying COPD and risk for respiratory decompensation will admit for pain control and pulmonary therapy.  Signed out to hospitalist.   ____________________________________________   FINAL CLINICAL IMPRESSION(S) / ED DIAGNOSES  Final diagnoses:  Closed fracture of multiple ribs of right side, initial encounter  Chest wall pain      NEW MEDICATIONS STARTED DURING THIS VISIT:  Current Discharge Medication List       Note:  This document was prepared using Dragon voice recognition software and may include unintentional dictation errors.    Arta Silence, MD 10/19/16 [phone]

## 2016-10-19 NOTE — H&P (Signed)
History and Physical   [facility] - [place] @ [facility] Admission History and Physical [employer], D.O.    Patient Name: Traci Holmes MR#: [id number] Date of Birth: 1945/08/20 Date of Admission: 10/19/2016  Referring MD/NP/PA: Dr. Cherylann Banas Primary Care Physician: Raelyn Number, MD  Chief Complaint:  Chief Complaint  Patient presents with  . Fall  . Chest Pain    HPI: Traci Holmes is a 71 y.o. female with a known history of also Alzheimer's dementia, hyperlipidemia, seizure disorder presents to the emergency department for evaluation of chest pain status post fall.  Patient was in a usual state of health until about one hour prior to arrival when she sustained a mechanical fall and landed on her right side. She presents to the emergency room complaining of right flank and back pain. she denies preceding symptoms as dizziness, lightheadedness or palpitations. She denies head trauma or loss of consciousness.of note patient was recently seen at this facility again with a fall and sustained rib fractures on the same side. That was on 10/14/2016. She has been using lidocaine patches for pain control.  Patient denies fevers/chills, weakness, dizziness, chest pain, shortness of breath, N/V/C/D, abdominal pain, dysuria/frequency, changes in mental status.    Otherwise there has been no change in status. Patient has been taking medication as prescribed and there has been no recent change in medication or diet.  No recent antibiotics.  There has been no recent illness, hospitalizations, travel or sick contacts.    EMS/ED Course: Patient was found to be hypoxic during her stay in the emergency department therefore medical admission has been requested for further management of pain control secondary to multiple rib fractures as well as hypoxia.  Review of Systems:  CONSTITUTIONAL: No fever/chills, fatigue, weakness, weight gain/loss, headache. EYES: No blurry or double vision. ENT: No  tinnitus, postnasal drip, redness or soreness of the oropharynx. RESPIRATORY: No cough, dyspnea, wheeze.  No hemoptysis.  CARDIOVASCULAR: No chest pain, palpitations, syncope, orthopnea. No lower extremity edema.  GASTROINTESTINAL: No nausea, vomiting, abdominal pain, diarrhea, constipation.  No hematemesis, melena or hematochezia. GENITOURINARY: No dysuria, frequency, hematuria. ENDOCRINE: No polyuria or nocturia. No heat or cold intolerance. HEMATOLOGY: No anemia, bruising, bleeding. INTEGUMENTARY: No rashes, ulcers, lesions. MUSCULOSKELETAL:right flank and back pain No arthritis, gout, dyspnea. NEUROLOGIC: No numbness, tingling, ataxia, seizure-type activity, weakness. PSYCHIATRIC: No anxiety, depression, insomnia.   Past Medical History:  Diagnosis Date  . Alzheimer disease   . Aphasia   . Cancer [facility])    Uterine Cancer-30 years ago with hysterectomy.  . Dementia   . Hyperlipemia   . Hypocalcemia   . Major neurocognitive disorder, unspecified 08/25/2014  . Seizures ([place])     Past Surgical History:  Procedure Laterality Date  . ABDOMINAL HYSTERECTOMY     Does not remember date 30-40 years ago?     reports that she has been smoking Cigarettes.  She has been smoking about 1.00 pack per day. She has never used smokeless tobacco. She reports that she does not drink alcohol or use drugs.  Allergies  Allergen Reactions  . Penicillins Other (See Comments)    Reaction: unknown  Patient cannot answer follow-up questions    Family History  Problem Relation Age of Onset  . Cancer Mother        breast  . Cancer Father        prostate    Prior to Admission medications   Medication Sig Start Date End Date Taking? Authorizing Provider  acetaminophen (TYLENOL) 500 MG tablet Take 500 mg by mouth every 6 (six) hours as needed for fever or headache.     [provider]  albuterol (PROVENTIL HFA;VENTOLIN HFA) 108 (90 Base) MCG/ACT inhaler Inhale 2 puffs into the lungs every  6 (six) hours as needed for wheezing or shortness of breath. 10/03/16   Darel Hong, MD  ARIPiprazole (ABILIFY) 2 MG tablet Take 1 mg by mouth daily.    [provider]  Cholecalciferol (VITAMIN D3) 50000 units CAPS Take by mouth.    [provider]  dexamethasone (DECADRON) 4 MG tablet Take 1 tablet (4 mg total) by mouth 3 (three) times daily. 10/18/16 10/18/17  Hillary Bow, MD  docusate sodium (COLACE) 100 MG capsule Take 2 capsules (200 mg total) by mouth 2 (two) times daily. 02/14/15   Dustin Flock, MD  fluticasone (FLONASE) 50 MCG/ACT nasal spray Place 1 spray into both nostrils 2 (two) times daily.    [provider]  lactose free nutrition (BOOST) LIQD Take 237 mLs by mouth 3 (three) times daily between meals.    [provider]  levETIRAcetam (KEPPRA) 500 MG tablet Take 1 tablet (500 mg total) by mouth 2 (two) times daily. Patient taking differently: Take 500 mg by mouth 3 (three) times daily.  02/14/15   Dustin Flock, MD  lidocaine (LIDODERM) 5 % Place 1 patch onto the skin daily. Remove & Discard patch within 12 hours or as directed by MD 10/18/16   Hillary Bow, MD  memantine (NAMENDA) 10 MG tablet Take 10 mg by mouth 2 (two) times daily.    [provider]  promethazine (PHENERGAN) 25 MG tablet Take 25 mg by mouth every 6 (six) hours as needed for nausea or vomiting.    [provider]  sertraline (ZOLOFT) 100 MG tablet Take 100 mg by mouth daily.    [provider]  Spacer/Aero Chamber Mouthpiece MISC 1 Units by Does not apply route every 4 (four) hours as needed (wheezing). 10/03/16   Darel Hong, MD  traMADol (ULTRAM) 50 MG tablet Take 1 tablet (50 mg total) by mouth every 6 (six) hours as needed. 10/18/16 10/18/17  Hillary Bow, MD  Vitamin D, Ergocalciferol, (DRISDOL) 50000 units CAPS capsule Take 50,000 Units by mouth every 7 (seven) days.    [provider]    Physical Exam: Vitals:   10/19/16  2005 10/19/16 2006 10/19/16 2100 10/19/16 2133  BP: (!) 129/47  (!) 129/53 (!) 108/59  Pulse: 60  (!) 51 (!) 55  Resp: (!) 21  18 17   Temp: 97.8 F (36.6 C)     TempSrc: Oral     SpO2: 93%  97% 90%  Weight:  42.3 kg (93 lb 3.2 oz)    Height:  5' (1.524 m)      GENERAL: 71 y.o.-year-old female patient, well-developed, well-nourished lying in the bed in no acute distress.  Pleasant and cooperative.   HEENT: Head atraumatic, normocephalic. Pupils equal. Mucus membranes moist. NECK: Supple, full range of motion. No JVD, no bruit heard. No thyroid enlargement, no tenderness, no cervical lymphadenopathy. CHEST: Normal breath sounds bilaterally. No wheezing, rales, rhonchi or crackles. No use of accessory muscles of respiration.  Positive reproducible posterior rib cagetenderness on the right CARDIOVASCULAR: S1, S2 normal. No murmurs, rubs, or gallops. Cap refill <2 seconds. Pulses intact distally.  ABDOMEN: Soft, nondistended, nontender. No rebound, guarding, rigidity. Normoactive bowel sounds present in all four quadrants.  EXTREMITIES: No pedal edema, cyanosis, or clubbing.  No calf tenderness or Homan's sign.  NEUROLOGIC: The patient is alert and oriented x 3. Cranial nerves II through XII are grossly intact with no focal sensorimotor deficit. PSYCHIATRIC:  Normal affect, mood, thought content. SKIN: Warm, dry, and intact without obvious rash, lesion, or ulcer.    Labs on Admission:  CBC:  Recent Labs Lab 10/14/16 1715 10/18/16 0439 10/19/16 2008  WBC 12.8* 13.1* 13.4*  NEUTROABS 10.8*  --   --   HGB 14.2 14.3 14.2  HCT 43.0 42.8 42.8  MCV 93.9 94.9 94.8  PLT 230 224 244   Basic Metabolic Panel:  Recent Labs Lab 10/14/16 1715 10/15/16 0430 10/19/16 2008  NA 140 136 134*  K 3.7 4.2 4.6  CL 103 101 95*  CO2 28 27 29   GLUCOSE 88 161* 131*  BUN 13 14 31*  CREATININE 0.45 0.44 0.62  CALCIUM 8.4* 9.4 9.5   GFR: Estimated Creatinine Clearance: 42.4 mL/min (by C-G formula  based on SCr of 0.62 mg/dL). Liver Function Tests:  Recent Labs Lab 10/14/16 1715  AST 33  ALT 12*  ALKPHOS 64  BILITOT 0.3  PROT 6.6  ALBUMIN 3.7    Recent Labs Lab 10/14/16 1715  LIPASE 28   No results for input(s): AMMONIA in the last 168 hours. Coagulation Profile: No results for input(s): INR, PROTIME in the last 168 hours. Cardiac Enzymes: No results for input(s): CKTOTAL, CKMB, CKMBINDEX, TROPONINI in the last 168 hours. BNP (last 3 results) No results for input(s): PROBNP in the last 8760 hours. HbA1C: No results for input(s): HGBA1C in the last 72 hours. CBG: No results for input(s): GLUCAP in the last 168 hours. Lipid Profile: No results for input(s): CHOL, HDL, LDLCALC, TRIG, CHOLHDL, LDLDIRECT in the last 72 hours. Thyroid Function Tests: No results for input(s): TSH, T4TOTAL, FREET4, T3FREE, THYROIDAB in the last 72 hours. Anemia Panel: No results for input(s): VITAMINB12, FOLATE, FERRITIN, TIBC, IRON, RETICCTPCT in the last 72 hours. Urine analysis:    Component Value Date/Time   COLORURINE YELLOW (A) 10/14/2016 2154   APPEARANCEUR CLEAR (A) 10/14/2016 2154   APPEARANCEUR Clear 01/28/2014 2002   LABSPEC 1.021 10/14/2016 2154   LABSPEC 1.049 01/28/2014 2002   PHURINE 5.0 10/14/2016 2154   GLUCOSEU 50 (A) 10/14/2016 2154   GLUCOSEU Negative 01/28/2014 2002   HGBUR NEGATIVE 10/14/2016 2154   BILIRUBINUR NEGATIVE 10/14/2016 2154   BILIRUBINUR Negative 01/28/2014 2002   KETONESUR 20 (A) 10/14/2016 2154   PROTEINUR NEGATIVE 10/14/2016 2154   NITRITE NEGATIVE 10/14/2016 2154   LEUKOCYTESUR NEGATIVE 10/14/2016 2154   LEUKOCYTESUR Negative 01/28/2014 2002   Sepsis Labs: @LABRCNTIP (procalcitonin:4,lacticidven:4) ) Recent Results (from the past 240 hour(s))  MRSA PCR Screening     Status: Abnormal   Collection Time: 10/14/16  7:34 PM  Result Value Ref Range Status   MRSA by PCR POSITIVE (A) NEGATIVE Final    Comment:        The GeneXpert MRSA Assay  (FDA approved for NASAL specimens only), is one component of a comprehensive MRSA colonization surveillance program. It is not intended to diagnose MRSA infection nor to guide or monitor treatment for MRSA infections. RESULT CALLED TO, READ BACK BY AND VERIFIED WITH: JACKIE PAGE ON 10/14/16 AT 2317 QSD      Radiological Exams on Admission: Dg Chest 2 View  Result Date: 10/19/2016 CLINICAL DATA:  Unwitnessed fall with right-sided rib injury. EXAM: CHEST  2 VIEW COMPARISON:  10/14/2016 FINDINGS: Cardiomediastinal silhouette is normal. Mediastinal contours appear intact. There  is no evidence of pneumothorax. Small right pleural effusion. Known right apical mass again seen, as well as prominence of the bilateral hilar regions. Known minimally displaced right lower rib fracture. Soft tissues are grossly normal. IMPRESSION: Small right pleural effusion. Known minimally displaced right seventh rib fracture. Known right apical mass and bilateral hilar region prominence, suspicious for malignancy with associated lymphadenopathy. Electronically Signed   By: Fidela Salisbury M.D.   On: 10/19/2016 21:04   Dg Ribs Unilateral W/chest Right  Result Date: 10/19/2016 CLINICAL DATA:  Status post fall with right-sided rib trauma. EXAM: RIGHT RIBS AND CHEST - 3+ VIEW COMPARISON:  Chest radiograph 10/14/2016 FINDINGS: There are minimally displaced fractures of the right lateral seventh, eighth and ninth ribs. There is a small right pleural effusion. No evidence of pneumothorax. Known right apical mass is again seen. Calcific atherosclerotic disease of the aorta. IMPRESSION: Minimally displaced fractures of the lateral seventh, eighth and ninth ribs. Electronically Signed   By: Fidela Salisbury M.D.   On: 10/19/2016 21:08    EKG: Sinus bradycardia at 56 bpm with normal axis and nonspecific ST-T wave changes.   Assessment/Plan  This is a 71 y.o. female with a history of Alzheimer's dementia,  hyperlipidemia, seizure disorder now being admitted with:  #. Intractable pain and hypoxia secondary to rib fractures, right-sided ribs 7 through 9 -Admit to observation -Pain control -Oxygen therapy and continuous pulse oximetry -Incentive spirometry  #. Multiple mechanical falls -physical therapy evaluation has been requested  #. History of Alzheimer's dementia/depression -Continue Namenda, Zoloft, Abilify  #. History of seizure disorder - Continue Keppra  Admission status: observation IV Fluids: normal saline Diet/Nutrition: heart healthy Consults called: physical therapy  DVT Px: Lovenox, SCDs and early ambulation. Code Status: Full Code  Disposition Plan: To home in 1-2 days  All the records are reviewed and case discussed with ED provider. Management plans discussed with the patient and/or family who express understanding and agree with plan of care.  Traci Holmes D.O. on 10/19/2016 at 10:14 PM Between 7am to 6pm - Pager - [phone] After 6pm go to [email or web address] - [facility] [place] Hospitalists Office [phone] CC: Primary care physician; Raelyn Number, MD   10/19/2016, 10:14 PM

## 2016-10-19 NOTE — ED Triage Notes (Addendum)
Pt arrives to ED via [facility] from [place] s/p unwitnessed fall with RIGHT-sided rib inury. EMS reports pt had fallen and landed between the nightstand and the bed. Pt recently d/c'd from [facility] on [facility] for fall with known rib fractures on the same side. Pt arrives with ID band in place from last visit, gauze still in place from IV removal during that visit, and Lidoderm patch in place over the right side (unknown if new or from previous visit). Pt is A&O, able to answer questions appropriately, in NAD; RR even, regular and unlabored. Pt does not use home O2, but pt arrives via EMS with 2L O2 via  for comfort; arrival sats noted to be 92-94% so pt kept on 2L O2 to maintain adequate sats. Pt denies any dizziness, lightheadedness, or syncopal s/x's prior to fall. Pt also denies any c/o head or neck injury/pain; denies LOC.

## 2016-10-19 NOTE — ED Notes (Signed)
ED Provider at bedside. 

## 2016-10-19 NOTE — ED Notes (Signed)
Pt dropped to 88% O2 sat on room air while sleeping; placed pt back on 2L [place]. Pt 96% 2L [place] at this time.

## 2016-10-19 NOTE — ED Notes (Signed)
Pt 94% on room air at this time. Will continue to monitor.

## 2016-10-20 DIAGNOSIS — S2241XA Multiple fractures of ribs, right side, initial encounter for closed fracture: Secondary | ICD-10-CM

## 2016-10-20 DIAGNOSIS — F0280 Dementia in other diseases classified elsewhere without behavioral disturbance: Secondary | ICD-10-CM

## 2016-10-20 DIAGNOSIS — C55 Malignant neoplasm of uterus, part unspecified: Secondary | ICD-10-CM

## 2016-10-20 DIAGNOSIS — G309 Alzheimer's disease, unspecified: Secondary | ICD-10-CM

## 2016-10-20 DIAGNOSIS — C7931 Secondary malignant neoplasm of brain: Secondary | ICD-10-CM

## 2016-10-20 DIAGNOSIS — Z515 Encounter for palliative care: Secondary | ICD-10-CM

## 2016-10-20 DIAGNOSIS — Z7189 Other specified counseling: Secondary | ICD-10-CM

## 2016-10-20 LAB — CREATININE, SERUM: Creatinine, Ser: 0.5 mg/dL (ref 0.44–1.00)

## 2016-10-20 LAB — CBC
HCT: 42.1 % (ref 35.0–47.0)
Hemoglobin: 14.2 g/dL (ref 12.0–16.0)
MCH: 32 pg (ref 26.0–34.0)
MCHC: 33.7 g/dL (ref 32.0–36.0)
MCV: 95 fL (ref 80.0–100.0)
PLATELETS: 234 10*3/uL (ref 150–440)
RBC: 4.43 MIL/uL (ref 3.80–5.20)
RDW: 14.1 % (ref 11.5–14.5)
WBC: 11.7 10*3/uL — ABNORMAL HIGH (ref 3.6–11.0)

## 2016-10-20 MED ORDER — OXYCODONE HCL 5 MG PO TABS: 5.0000 mg | ORAL_TABLET | ORAL | 0 refills | Status: AC | PRN

## 2016-10-20 MED ORDER — ACETAMINOPHEN 325 MG PO TABS
650.0000 mg | ORAL_TABLET | Freq: Four times a day (QID) | ORAL | Status: DC
  Administered 2016-10-20 (×2): 650 mg via ORAL
  Filled 2016-10-20 (×2): qty 2

## 2016-10-20 MED ORDER — ENOXAPARIN SODIUM 30 MG/0.3ML ~~LOC~~ SOLN: 30.0000 mg | SUBCUTANEOUS | Status: DC

## 2016-10-20 NOTE — Care Management (Signed)
Per CSW, patient will take EMS to [facility] today because of new O2 requirements.I have confirmed this with patient's DSS guardian Milana Kidney by phone. Per [name] patient's son can transport portable tank to [place] since he was willing to travel to [place] anyway. Brad with [facility] are care notified that EMS is being called at this time.

## 2016-10-20 NOTE — Care Management (Signed)
RNCM notified by Thayer Headings at [place] that patient has guardian under DSS Milana Kidney [phone] fax [phone]. Place in [place] notation; notified. CSW.

## 2016-10-20 NOTE — Progress Notes (Signed)
Per Randall Hiss, Education officer, museum and Levada Dy, care management [facility] does not require report to be called because they are assisted living and not a skilled facility.  Update and notification called to [facility], med tech that patient would be coming this afternoon via EMS.  Son at bedside and verbalized that he would take portable 02 tank to [place].

## 2016-10-20 NOTE — Progress Notes (Signed)
No charge note:   Palliative consult received:  Spoke with Clancy Gourd- caretaker at patient's assisted living-  Guardianship hearing in progress this morning. Thayer Headings will call with results of hearing.   Lidoderm patch has not been changed since patient was discharged due to new patches have not been able to be obtained because of financial reasons.  [name], AGNP-C Palliative Medicine  Please call Palliative Medicine team phone with any questions [phone]. For individual providers please see AMION.

## 2016-10-20 NOTE — Care Management (Signed)
Nebulizer treatments and incentive spirometer has been tried in attempt to wean patient for supplemental Oxygen. Home Oxygen has been arranged through [facility] with [facility].

## 2016-10-20 NOTE — Clinical Social Work Note (Signed)
CSW spoke to [place] at [place] she can accept patient back today.  CSW was informed that patient now has a legal guardian through [place].  Legal Guardian is Milana Kidney [phone], fax is [phone].  Patient to be d/c'ed today to [place].  Patient and family agreeable to plans will transport via ems RN to call report.    Evette Cristal, MSW, [place]

## 2016-10-20 NOTE — Care Management Obs Status (Signed)
[place] NOTIFICATION   Patient Details  Name: Traci Holmes MRN: [id number] Date of Birth: [birth year]   Medicare Observation Status Notification Given:  Yes    Marshell Garfinkel, RN 10/20/2016, 8:35 AM

## 2016-10-20 NOTE — Progress Notes (Signed)
Oxygen stable on 2L overnight on continuous pulse ox. No c/o pain unless moving in bed- refused pain medication intervention.   Bed alarm on, call bell within reach, room near nurses station. Patient understands to call for assistance before getting out of bed.

## 2016-10-20 NOTE — NC FL2 (Signed)
[place] LEVEL OF CARE SCREENING TOOL     IDENTIFICATION  Patient Name: Traci Holmes Birthdate: 09-15-1945 Sex: female Admission Date (Current Location): 10/19/2016  [place] Number:  [name] [phone] [place] and Address:  [facility], [address] [place], [place] [address]      Provider Number: [id number]  Attending Physician Name and Address:  Hillary Bow, MD  Relative Name and Phone Number:       Current Level of Care: Hospital Recommended Level of Care: [place] Prior Approval Number:    Date Approved/Denied:   PASRR Number: [id number]  Discharge Plan: Domiciliary ([facility]) ([place])    Current Diagnoses: Patient Active Problem List   Diagnosis Date Noted  . Intractable pain 10/19/2016  . Metastatic cancer ([place])   . Closed fracture of one rib of right side   . Goals of care, counseling/discussion   . Palliative care by specialist   . Advance care planning   . Encephalopathy 10/14/2016  . Altered mental status   . Elevated troponin   . Seizure ([place])   . Bradycardia   . Demand ischemia ([place])   . Protein-calorie malnutrition, severe 02/09/2015  . Status epilepticus ([place]) 02/08/2015  . Dementia, old age   . Major neurocognitive disorder, unspecified 08/25/2014    Orientation RESPIRATION BLADDER Height & Weight     Self, Place  O2 (2L) Incontinent Weight: 86 lb 4.8 oz (39.1 kg) Height:  5' (152.4 cm)  BEHAVIORAL SYMPTOMS/MOOD NEUROLOGICAL BOWEL NUTRITION STATUS    Convulsions/Seizures Continent Diet (Regular diet)  AMBULATORY STATUS COMMUNICATION OF NEEDS Skin   Limited Assist   Bruising                       Personal Care Assistance Level of Assistance  Bathing, Feeding, Dressing Bathing Assistance: Limited assistance Feeding assistance: Limited assistance Dressing Assistance: Limited assistance     Functional Limitations Info  Sight, Hearing,  Speech Sight Info: Adequate Hearing Info: Adequate Speech Info: Adequate    SPECIAL CARE FACTORS FREQUENCY  PT (By licensed PT)     PT Frequency:  (2x a week home health PT)              Contractures Contractures Info: Not present    Additional Factors Info  Code Status, Allergies, Psychotropic Code Status Info: Full Code Allergies Info: Penicillins Psychotropic Info: ARIPiprazole (ABILIFY) tablet 1 mg, memantine (NAMENDA) tablet 10 mg, sertraline (ZOLOFT) tablet 100 mg         Current Medications (10/20/2016):  This is the current hospital active medication list Current Facility-Administered Medications  Medication Dose Route Frequency Provider Last Rate Last Dose  . acetaminophen (TYLENOL) tablet 650 mg  650 mg Oral Q6H Mahan, Wayna Chalet, NP   650 mg at 10/20/16 1013  . albuterol (PROVENTIL) (2.5 MG/3ML) 0.083% nebulizer solution 2.5 mg  2.5 mg Nebulization Q6H PRN Hugelmeyer, Alexis, DO      . ARIPiprazole (ABILIFY) tablet 1 mg  1 mg Oral Daily Hugelmeyer, Alexis, DO   1 mg at 10/20/16 0843  . bisacodyl (DULCOLAX) EC tablet 5 mg  5 mg Oral Daily PRN Hugelmeyer, Alexis, DO      . dexamethasone (DECADRON) tablet 4 mg  4 mg Oral TID Hugelmeyer, Alexis, DO   4 mg at 10/20/16 0844  . docusate sodium (COLACE) capsule 200 mg  200 mg Oral BID Hugelmeyer, Alexis, DO   200 mg at 10/20/16 0843  .  enoxaparin (LOVENOX) injection 30 mg  30 mg Subcutaneous Q24H Ramond Dial, RPH      . fluticasone (FLONASE) 50 MCG/ACT nasal spray 1 spray  1 spray Each Nare BID Hugelmeyer, Alexis, DO   1 spray at 10/20/16 1013  . ipratropium (ATROVENT) nebulizer solution 0.5 mg  0.5 mg Nebulization Q6H PRN Hugelmeyer, Alexis, DO      . lactose free nutrition (Boost) liquid 237 mL  237 mL Oral TID BM Hugelmeyer, Alexis, DO   237 mL at 10/20/16 0844  . levETIRAcetam (KEPPRA) tablet 500 mg  500 mg Oral TID Hugelmeyer, Alexis, DO   500 mg at 10/20/16 0843  . lidocaine (LIDODERM) 5 % 1 patch  1 patch  Transdermal Q24H Hugelmeyer, Alexis, DO   1 patch at 10/20/16 [phone]  . magnesium citrate solution 1 Bottle  1 Bottle Oral Once PRN Hugelmeyer, Alexis, DO      . memantine (NAMENDA) tablet 10 mg  10 mg Oral BID Hugelmeyer, Alexis, DO   10 mg at 10/20/16 0843  . ondansetron (ZOFRAN) tablet 4 mg  4 mg Oral Q6H PRN Hugelmeyer, Alexis, DO       Or  . ondansetron (ZOFRAN) injection 4 mg  4 mg Intravenous Q6H PRN Hugelmeyer, Alexis, DO      . oxyCODONE (Oxy IR/ROXICODONE) immediate release tablet 5 mg  5 mg Oral Q4H PRN Hugelmeyer, Alexis, DO   5 mg at 10/20/16 0954  . promethazine (PHENERGAN) tablet 25 mg  25 mg Oral Q6H PRN Hugelmeyer, Alexis, DO      . senna-docusate (Senokot-S) tablet 1 tablet  1 tablet Oral QHS PRN Hugelmeyer, Alexis, DO      . sertraline (ZOLOFT) tablet 100 mg  100 mg Oral Daily Hugelmeyer, Alexis, DO   100 mg at 10/20/16 0843  . traMADol (ULTRAM) tablet 50 mg  50 mg Oral Q6H PRN Hugelmeyer, Alexis, DO         Discharge Medications: Please see discharge summary for a list of discharge medications.  Current Discharge Medication List        START taking these medications   Details  oxyCODONE (OXY IR/ROXICODONE) 5 MG immediate release tablet Take 1 tablet (5 mg total) by mouth every 4 (four) hours as needed for severe pain. Qty: 30 tablet, Refills: 0          CONTINUE these medications which have NOT CHANGED   Details  acetaminophen (TYLENOL) 500 MG tablet Take 500 mg by mouth every 6 (six) hours as needed for fever or headache.     albuterol (PROVENTIL HFA;VENTOLIN HFA) 108 (90 Base) MCG/ACT inhaler Inhale 2 puffs into the lungs every 6 (six) hours as needed for wheezing or shortness of breath. Qty: 1 Inhaler, Refills: 0    ARIPiprazole (ABILIFY) 2 MG tablet Take 1 mg by mouth daily.    Cholecalciferol (VITAMIN D3) 50000 units CAPS Take by mouth.    dexamethasone (DECADRON) 4 MG tablet Take 1 tablet (4 mg total) by mouth 3 (three) times daily. Qty: 30  tablet, Refills: 0    docusate sodium (COLACE) 100 MG capsule Take 2 capsules (200 mg total) by mouth 2 (two) times daily. Qty: 10 capsule, Refills: 0    fluticasone (FLONASE) 50 MCG/ACT nasal spray Place 1 spray into both nostrils 2 (two) times daily.    lactose free nutrition (BOOST) LIQD Take 237 mLs by mouth 3 (three) times daily between meals.    levETIRAcetam (KEPPRA) 500 MG tablet Take 1 tablet (500 mg  total) by mouth 2 (two) times daily. Qty: 10 tablet, Refills: 0    lidocaine (LIDODERM) 5 % Place 1 patch onto the skin daily. Remove & Discard patch within 12 hours or as directed by MD Qty: 15 patch, Refills: 0    memantine (NAMENDA) 10 MG tablet Take 10 mg by mouth 2 (two) times daily.    promethazine (PHENERGAN) 25 MG tablet Take 25 mg by mouth every 6 (six) hours as needed for nausea or vomiting.    sertraline (ZOLOFT) 100 MG tablet Take 100 mg by mouth daily.    Spacer/Aero Chamber Mouthpiece MISC 1 Units by Does not apply route every 4 (four) hours as needed (wheezing). Qty: 1 each, Refills: 0    Vitamin D, Ergocalciferol, (DRISDOL) 50000 units CAPS capsule Take 50,000 Units by mouth every 7 (seven) days.         STOP taking these medications     traMADol (ULTRAM) 50 MG tablet            Relevant Imaging Results:  Relevant Lab Results:   Additional Information [phone]  Ross Ludwig, [place]

## 2016-10-20 NOTE — Progress Notes (Signed)
Verbal report given to [facility] with [facility].  Patient loaded on EMS stretcher to be taken to [facility].

## 2016-10-20 NOTE — Progress Notes (Signed)
Pt lovenox order changed to 30mg  q 24 hr due to a total body weight < 45kg  [name], Pharm.D, BCPS Clinical Pharmacist

## 2016-10-20 NOTE — Care Management (Addendum)
Message left for patient's son [name] to call [facility] regarding Medicare Observation notification.

## 2016-10-20 NOTE — Care Management (Signed)
Oxygen tank has been delivered to this room by [facility]. CSW updated. Notified by Santiago Glad that patient will be followed as outpatient by Palliative care team.

## 2016-10-20 NOTE — Care Management Obs Status (Signed)
[place] NOTIFICATION   Patient Details  Name: Traci Holmes MRN: [id number] Date of Birth: 28-Jun-1945   Medicare Observation Status Notification Given:  Yes  Patient signed however she has dementia. RNCM notified son Thressa Sheller regarding [facility].   Marshell Garfinkel, RN 10/20/2016, 9:18 AM

## 2016-10-20 NOTE — Discharge Instructions (Signed)
Resume diet and activity as before.  Oxygen 2 L/Min

## 2016-10-20 NOTE — Clinical Social Work Note (Addendum)
Patient to be d/c'ed today to [facility].  Patient and family agreeable to plans will transport via ems RN to call report.  CSW received phone call from [place], patient's legal guardian, requesting discharge summary and FL2 to be faxed to her.  CSW faxed requested information.  Evette Cristal, MSW, [place]

## 2016-10-20 NOTE — Consult Note (Signed)
Consultation Note Date: 10/20/2016   Patient Name: Traci Holmes  DOB: [birth year]  MRN: [id number]  Age / Sex: 71 y.o., female  PCP: Raelyn Number, MD Referring Physician: Hillary Bow, MD  Reason for Consultation: Establishing goals of care and Pain control  HPI/Patient Profile: 71 y.o. female  with past medical history of alzheimer's dementia, multiple falls, uterine cancer, COPD, and recently diagnosed lung cancer metastatic to her brain admitted on 10/19/2016 with fall resulting in pain and rib fractures.   Clinical Assessment and Goals of Care: Patient is familiar to me as I met with her and her son on 8/27 for initial consult.  She has dementia with severe short term memory loss but covers it well. She is not able to recall that she has cancer and I do not discuss it with her today.  On exam she is sitting up in bed, pleasant, does not recall meeting me. Tells me she fell off her commode (this is in conflict from EMS report which states she had an unwitnessed fall and was found between her bed and nightstand, and caretaker report who told me via phone she fell onto floor when trying to get up to go outside to smoke with a friend and her friend was helping her get up.) She complains of pain in her R rib area. She rates it a six and it impairs her from taking deep breathes. Per nursing her oxygen desaturates when nasal cannula is removed. She does not remember to ask for pain medicine. Per Traci Holmes- caretaker and [facility]   Guardianship hearing was held today and DSS has obtained legal guardianship for her.  I spoke with Milana Kidney and discussed recommendations for advance care planning. Given her advanced cancer, frail state, and dementia- medical recommendations are made for DO NOT RESUSCITATE status. I also discussed options for Ms. Widener's medical treatment including comfort care v continued aggressive  care.  Ms. Idolina Primer requested a letter in writing reviewing our discussion with code status recommendation. I will write this and forward to her.   Primary Decision Maker LEGAL GUARDIAN - [name] [name]- DSS    SUMMARY OF RECOMMENDATIONS -PMT will write letter of recommendation for code status change and fax to Guardian -D/C to assisted living with outpatient Palliative services -Pain- continue lidoderm patch as ordered- start scheduled Tylenol '650mg'$  q6hr  As patient does not remember to ask for prn pain medication -Continue oxy ir '5mg'$  prn- recommend caretaker offer pain medication frequently as patient my have pain but not remember that she can ask to take it  Code Status/Advance Care Planning:  Full code    Symptom Management:   As above  Palliative Prophylaxis:   Frequent Pain Assessment  Prognosis:    < 12 months due to stage IV lung cancer with poor functional status, very frail state, nonambulatory, frequent falls  Discharge Planning: Home with Palliative Services  Primary Diagnoses: Present on Admission: . Intractable pain   I have reviewed the medical record, interviewed the patient and family,  and examined the patient. The following aspects are pertinent.  Past Medical History:  Diagnosis Date  . Alzheimer disease   . Aphasia   . Cancer [facility])    Uterine Cancer-30 years ago with hysterectomy.  . Dementia   . Hyperlipemia   . Hypocalcemia   . Major neurocognitive disorder, unspecified 08/25/2014  . Seizures [facility])    Social History   Social History  . Marital status: Widowed    Spouse name: N/A  . Number of children: N/A  . Years of education: N/A   Social History Main Topics  . Smoking status: Current Every Day Smoker    Packs/day: 1.00    Types: Cigarettes  . Smokeless tobacco: Never Used  . Alcohol use No  . Drug use: No  . Sexual activity: Not Currently   Other Topics Concern  . None   Social History Narrative  . None   Family History    Problem Relation Age of Onset  . Cancer Mother        breast  . Cancer Father        prostate   Scheduled Meds: . acetaminophen  650 mg Oral Q6H  . ARIPiprazole  1 mg Oral Daily  . dexamethasone  4 mg Oral TID  . docusate sodium  200 mg Oral BID  . enoxaparin (LOVENOX) injection  30 mg Subcutaneous Q24H  . fluticasone  1 spray Each Nare BID  . lactose free nutrition  237 mL Oral TID BM  . levETIRAcetam  500 mg Oral TID  . lidocaine  1 patch Transdermal Q24H  . memantine  10 mg Oral BID  . sertraline  100 mg Oral Daily   Continuous Infusions: PRN Meds:.albuterol, bisacodyl, ipratropium, magnesium citrate, ondansetron **OR** ondansetron (ZOFRAN) IV, oxyCODONE, promethazine, senna-docusate, traMADol Medications Prior to Admission:  Prior to Admission medications   Medication Sig Start Date End Date Taking? Authorizing Provider  acetaminophen (TYLENOL) 500 MG tablet Take 500 mg by mouth every 6 (six) hours as needed for fever or headache.    Yes [provider]  albuterol (PROVENTIL HFA;VENTOLIN HFA) 108 (90 Base) MCG/ACT inhaler Inhale 2 puffs into the lungs every 6 (six) hours as needed for wheezing or shortness of breath. 10/03/16  Yes Darel Hong, MD  ARIPiprazole (ABILIFY) 2 MG tablet Take 1 mg by mouth daily.   Yes [provider]  Cholecalciferol (VITAMIN D3) 50000 units CAPS Take by mouth.   Yes [provider]  dexamethasone (DECADRON) 4 MG tablet Take 1 tablet (4 mg total) by mouth 3 (three) times daily. 10/18/16 10/18/17 Yes Sudini, Alveta Heimlich, MD  docusate sodium (COLACE) 100 MG capsule Take 2 capsules (200 mg total) by mouth 2 (two) times daily. 02/14/15  Yes Dustin Flock, MD  fluticasone (FLONASE) 50 MCG/ACT nasal spray Place 1 spray into both nostrils 2 (two) times daily.   Yes [provider]  lactose free nutrition (BOOST) LIQD Take 237 mLs by mouth 3 (three) times daily between meals.   Yes [provider]  levETIRAcetam  (KEPPRA) 500 MG tablet Take 1 tablet (500 mg total) by mouth 2 (two) times daily. Patient taking differently: Take 500 mg by mouth 3 (three) times daily.  02/14/15  Yes Dustin Flock, MD  lidocaine (LIDODERM) 5 % Place 1 patch onto the skin daily. Remove & Discard patch within 12 hours or as directed by MD 10/18/16  Yes Hillary Bow, MD  memantine (NAMENDA) 10 MG tablet Take 10 mg by mouth 2 (two)  times daily.   Yes [provider]  promethazine (PHENERGAN) 25 MG tablet Take 25 mg by mouth every 6 (six) hours as needed for nausea or vomiting.   Yes [provider]  sertraline (ZOLOFT) 100 MG tablet Take 100 mg by mouth daily.   Yes [provider]  Spacer/Aero Chamber Mouthpiece MISC 1 Units by Does not apply route every 4 (four) hours as needed (wheezing). 10/03/16  Yes Darel Hong, MD  traMADol (ULTRAM) 50 MG tablet Take 1 tablet (50 mg total) by mouth every 6 (six) hours as needed. Patient taking differently: Take 50 mg by mouth every 6 (six) hours as needed for moderate pain.  10/18/16 10/18/17 Yes Sudini, Alveta Heimlich, MD  Vitamin D, Ergocalciferol, (DRISDOL) 50000 units CAPS capsule Take 50,000 Units by mouth every 7 (seven) days.   Yes [provider]  oxyCODONE (OXY IR/ROXICODONE) 5 MG immediate release tablet Take 1 tablet (5 mg total) by mouth every 4 (four) hours as needed for severe pain. 10/20/16   Hillary Bow, MD   Allergies  Allergen Reactions  . Penicillins Other (See Comments)    Reaction: unknown  Patient cannot answer follow-up questions   Review of Systems  Unable to perform ROS: Dementia    Physical Exam  Constitutional:  Frail elderly  Pulmonary/Chest: Effort normal.  Abdominal: Soft.  Neurological: She is alert.  Oriented to person only, advanced dementia  Skin: Skin is warm and dry.  Nursing note and vitals reviewed.   Vital Signs: BP (!) 120/49   Pulse (!) 54   Temp 97.8 F (36.6 C) (Axillary)   Resp 16   Ht 5' (1.524  m)   Wt 39.1 kg (86 lb 4.8 oz)   SpO2 98%   BMI 16.85 kg/m  Pain Assessment: 0-10   Pain Score: Asleep   SpO2: SpO2: 98 % O2 Device:SpO2: 98 % O2 Flow Rate: .O2 Flow Rate (L/min): 2 L/min  IO: Intake/output summary: No intake or output data in the 24 hours ending 10/20/16 1120  LBM: Last BM Date: 10/19/16 Baseline Weight: Weight: 42.3 kg (93 lb 3.2 oz) Most recent weight: Weight: 39.1 kg (86 lb 4.8 oz)     Palliative Assessment/Data:PPS: 30%     Thank you for this consult. Palliative medicine will continue to follow and assist as needed.    Time In: 1000 Time Out: 1115 Time Total:75 minutes Greater than 50%  of this time was spent counseling and coordinating care related to the above assessment and plan.  Signed by: [name], AGNP-C Palliative Medicine    Please contact Palliative Medicine Team phone at [phone] for questions and concerns.  For individual provider: See Shea Evans

## 2016-10-20 NOTE — Clinical Social Work Note (Signed)
Clinical Social Work Assessment  Patient Details  Name: Traci Holmes MRN: [id number] Date of Birth: Jun 25, 1945  Date of referral:  10/20/16               Reason for consult:  Facility Placement                Permission sought to share information with:  Facility Sport and exercise psychologist Permission granted to share information::  Yes, Verbal Permission Granted  Name::     Traci Holmes Other   [phone] or Traci Holmes Legal Guardian [phone]    Agency::  [facility]  Relationship::     Contact Information:     Housing/Transportation Living arrangements for the past 2 months:  [place] of Information:  Medical Team, Facility Patient Interpreter Needed:  None Criminal Activity/Legal Involvement Pertinent to Current Situation/Hospitalization:  No - Comment as needed Significant Relationships:  Adult Children, Other(Comment) (Care Provider) Lives with:  Facility Resident Do you feel safe going back to the place where you live?  Yes Need for family participation in patient care:  Yes (Comment)  Care giving concerns:  Patient is from [facility], they do not express any concerns with having patient return.   Social Worker assessment / plan:  Patient is a 71 year old female who is widowed and is a resident of [facility].  Patient has dementia, assessment was completed by speaking with ALF supervisor Traci Holmes.  ALF supervisor states patient has been at facility for couple of years, and her family are not very involved.  Patient had a hearing today for DSS to have guardianship of patient.  DSS is now the guardian, the case worker is named Traci Holmes [phone].   Patient is from the memory care unit and she has been receiving some home health PT.  Patient has frequent falls at ALF, because of her dementia.  Patient is now on oxygen at ALF, due to her not being able to ween off of it.  [facility] will be following patient at ALF for palliative.  Per  [facility], they do not have any issues with having patient return.  ALF supervisor did not have any other questions or concerns.  Employment status:  Retired Forensic scientist:  Information systems manager, Medicaid [place] PT Recommendations:  [place] / Referral to community resources:     Patient/Family's Response to care:  Patient's caregiver and legal guardian are in agreement to having patient return to ALF.  Patient/Family's Understanding of and Emotional Response to Diagnosis, Current Treatment, and Prognosis:  Patient has some dementia, and was just resting, ALF supervisor is aware that patient will be returning today.  Emotional Assessment Appearance:  Appears stated age Attitude/Demeanor/Rapport:  Lethargic Affect (typically observed):  Accepting, Calm Orientation:  Oriented to Self Alcohol / Substance use:  Not Applicable Psych involvement (Current and /or in the community):  No (Comment)  Discharge Needs  Concerns to be addressed:  Care Coordination, Discharge Planning Concerns Readmission within the last 30 days:  Yes (10-18-16 back to [facility]) Current discharge risk:  Lack of support system, Chronically ill, Cognitively Impaired Barriers to Discharge:  Continued Medical Work up   Traci Holmes 10/20/2016, 4:35 PM

## 2016-10-20 NOTE — Progress Notes (Signed)
SATURATION QUALIFICATIONS: (This note is used to comply with regulatory documentation for home oxygen)  Patient Saturations on Room Air at Rest = 86%  Patient Saturations on Room Air while Ambulating = Patient refuses to ambulate.  Patient Saturations on 2Liters of oxygen while Ambulating = Patient refuses to ambulate.  Patient oxygen on 2 Liters at rest 97%.   Please briefly explain why patient needs home oxygen:

## 2016-10-20 NOTE — Discharge Summary (Signed)
[facility] [place] NAME: Traci Holmes    MR#:  [id number]  DATE OF BIRTH:  1945-06-18  DATE OF ADMISSION:  10/19/2016 ADMITTING PHYSICIAN: Harvie Bridge, DO  DATE OF DISCHARGE: 10/20/2016  PRIMARY CARE PHYSICIAN: Raelyn Number, MD   ADMISSION DIAGNOSIS:  Chest wall pain [R07.89] Closed fracture of multiple ribs of right side, initial encounter [S22.41XA]  DISCHARGE DIAGNOSIS:  Active Problems:   Intractable pain  SECONDARY DIAGNOSIS:   Past Medical History:  Diagnosis Date  . Alzheimer disease   . Aphasia   . Cancer [facility])    Uterine Cancer-30 years ago with hysterectomy.  . Dementia   . Hyperlipemia   . Hypocalcemia   . Major neurocognitive disorder, unspecified 08/25/2014  . Seizures ([place])      ADMITTING HISTORY  HPI: Traci Holmes is a 71 y.o. female with a known history of also Alzheimer's dementia, hyperlipidemia, seizure disorder presents to the emergency department for evaluation of chest pain status post fall.  Patient was in a usual state of health until about one hour prior to arrival when she sustained a mechanical fall and landed on her right side. She presents to the emergency room complaining of right flank and back pain. she denies preceding symptoms as dizziness, lightheadedness or palpitations. She denies head trauma or loss of consciousness.of note patient was recently seen at this facility again with a fall and sustained rib fractures on the same side. That was on 10/14/2016. She has been using lidocaine patches for pain control.  Patient denies fevers/chills, weakness, dizziness, chest pain, shortness of breath, N/V/C/D, abdominal pain, dysuria/frequency, changes in mental status.    Otherwise there has been no change in status. Patient has been taking medication as prescribed and there has been no recent change in medication or diet.  No recent antibiotics.  There has been no recent illness, hospitalizations,  travel or sick contacts.    EMS/ED Course: Patient was found to be hypoxic during her stay in the emergency department therefore medical admission has been requested for further management of pain control secondary to multiple rib fractures as well as hypoxia.  HOSPITAL COURSE:   * Right 7,8,9 rib fractures Not sure if this is new rib fractures or was missed on prior CXR due to not getting a rib series. Pain control Add oxycodone.  Incentive spirometry  * Acute hypoxic resp failure Due to COPD and now new rib fractures. Also has stage 4 lung cancer  * Stage 4 lung cancer with brain mets Has radiation oncology appt on 10/25/2016. On Decadron  * Dementia  Stable for discharge  Needs palliative care following at ALF   CONSULTS OBTAINED:    DRUG ALLERGIES:   Allergies  Allergen Reactions  . Penicillins Other (See Comments)    Reaction: unknown  Patient cannot answer follow-up questions    DISCHARGE MEDICATIONS:   Current Discharge Medication List    START taking these medications   Details  oxyCODONE (OXY IR/ROXICODONE) 5 MG immediate release tablet Take 1 tablet (5 mg total) by mouth every 4 (four) hours as needed for severe pain. Qty: 30 tablet, Refills: 0      CONTINUE these medications which have NOT CHANGED   Details  acetaminophen (TYLENOL) 500 MG tablet Take 500 mg by mouth every 6 (six) hours as needed for fever or headache.     albuterol (PROVENTIL HFA;VENTOLIN HFA) 108 (90 Base) MCG/ACT inhaler Inhale 2 puffs into the lungs every  6 (six) hours as needed for wheezing or shortness of breath. Qty: 1 Inhaler, Refills: 0    ARIPiprazole (ABILIFY) 2 MG tablet Take 1 mg by mouth daily.    Cholecalciferol (VITAMIN D3) 50000 units CAPS Take by mouth.    dexamethasone (DECADRON) 4 MG tablet Take 1 tablet (4 mg total) by mouth 3 (three) times daily. Qty: 30 tablet, Refills: 0    docusate sodium (COLACE) 100 MG capsule Take 2 capsules (200 mg total) by mouth  2 (two) times daily. Qty: 10 capsule, Refills: 0    fluticasone (FLONASE) 50 MCG/ACT nasal spray Place 1 spray into both nostrils 2 (two) times daily.    lactose free nutrition (BOOST) LIQD Take 237 mLs by mouth 3 (three) times daily between meals.    levETIRAcetam (KEPPRA) 500 MG tablet Take 1 tablet (500 mg total) by mouth 2 (two) times daily. Qty: 10 tablet, Refills: 0    lidocaine (LIDODERM) 5 % Place 1 patch onto the skin daily. Remove & Discard patch within 12 hours or as directed by MD Qty: 15 patch, Refills: 0    memantine (NAMENDA) 10 MG tablet Take 10 mg by mouth 2 (two) times daily.    promethazine (PHENERGAN) 25 MG tablet Take 25 mg by mouth every 6 (six) hours as needed for nausea or vomiting.    sertraline (ZOLOFT) 100 MG tablet Take 100 mg by mouth daily.    Spacer/Aero Chamber Mouthpiece MISC 1 Units by Does not apply route every 4 (four) hours as needed (wheezing). Qty: 1 each, Refills: 0    Vitamin D, Ergocalciferol, (DRISDOL) 50000 units CAPS capsule Take 50,000 Units by mouth every 7 (seven) days.      STOP taking these medications     traMADol (ULTRAM) 50 MG tablet         Today   VITAL SIGNS:  Blood pressure (!) 120/49, pulse (!) 54, temperature 97.8 F (36.6 C), temperature source Axillary, resp. rate 16, height 5' (1.524 m), weight 39.1 kg (86 lb 4.8 oz), SpO2 100 %.  I/O:  No intake or output data in the 24 hours ending 10/20/16 1022  PHYSICAL EXAMINATION:  Physical Exam  GENERAL:  71 y.o.-year-old patient lying in the bed with no acute distress.  LUNGS: Normal breath sounds bilaterally, no wheezing, rales,rhonchi or crepitation. No use of accessory muscles of respiration. Right chest wall tenderness CARDIOVASCULAR: S1, S2 normal. No murmurs, rubs, or gallops.  ABDOMEN: Soft, non-tender, non-distended. Bowel sounds present. No organomegaly or mass.  NEUROLOGIC: Moves all 4 extremities. PSYCHIATRIC: The patient is alert and awake. Pleasantly  confused SKIN: No obvious rash, lesion, or ulcer.   DATA REVIEW:   CBC  Recent Labs Lab 10/19/16 2359  WBC 11.7*  HGB 14.2  HCT 42.1  PLT 234    Chemistries   Recent Labs Lab 10/14/16 1715  10/19/16 2008 10/19/16 2359  NA 140  < > 134*  --   K 3.7  < > 4.6  --   CL 103  < > 95*  --   CO2 28  < > 29  --   GLUCOSE 88  < > 131*  --   BUN 13  < > 31*  --   CREATININE 0.45  < > 0.62 0.50  CALCIUM 8.4*  < > 9.5  --   AST 33  --   --   --   ALT 12*  --   --   --   ALKPHOS 64  --   --   --  BILITOT 0.3  --   --   --   < > = values in this interval not displayed.  Cardiac Enzymes No results for input(s): TROPONINI in the last 168 hours.  Microbiology Results  Results for orders placed or performed during the hospital encounter of 10/14/16  MRSA PCR Screening     Status: Abnormal   Collection Time: 10/14/16  7:34 PM  Result Value Ref Range Status   MRSA by PCR POSITIVE (A) NEGATIVE Final    Comment:        The GeneXpert MRSA Assay (FDA approved for NASAL specimens only), is one component of a comprehensive MRSA colonization surveillance program. It is not intended to diagnose MRSA infection nor to guide or monitor treatment for MRSA infections. RESULT CALLED TO, READ BACK BY AND VERIFIED WITH: JACKIE PAGE ON 10/14/16 AT 2317 QSD     RADIOLOGY:  Dg Chest 2 View  Result Date: 10/19/2016 CLINICAL DATA:  Unwitnessed fall with right-sided rib injury. EXAM: CHEST  2 VIEW COMPARISON:  10/14/2016 FINDINGS: Cardiomediastinal silhouette is normal. Mediastinal contours appear intact. There is no evidence of pneumothorax. Small right pleural effusion. Known right apical mass again seen, as well as prominence of the bilateral hilar regions. Known minimally displaced right lower rib fracture. Soft tissues are grossly normal. IMPRESSION: Small right pleural effusion. Known minimally displaced right seventh rib fracture. Known right apical mass and bilateral hilar region  prominence, suspicious for malignancy with associated lymphadenopathy. Electronically Signed   By: Fidela Salisbury M.D.   On: 10/19/2016 21:04   Dg Ribs Unilateral W/chest Right  Result Date: 10/19/2016 CLINICAL DATA:  Status post fall with right-sided rib trauma. EXAM: RIGHT RIBS AND CHEST - 3+ VIEW COMPARISON:  Chest radiograph 10/14/2016 FINDINGS: There are minimally displaced fractures of the right lateral seventh, eighth and ninth ribs. There is a small right pleural effusion. No evidence of pneumothorax. Known right apical mass is again seen. Calcific atherosclerotic disease of the aorta. IMPRESSION: Minimally displaced fractures of the lateral seventh, eighth and ninth ribs. Electronically Signed   By: Fidela Salisbury M.D.   On: 10/19/2016 21:08    Follow up with PCP in 1 week.  Management plans discussed with the patient, family and they are in agreement.  CODE STATUS:     Code Status Orders        Start     Ordered   10/19/16 2351  Full code  Continuous     10/19/16 2351    Code Status History    Date Active Date Inactive Code Status Order ID Comments User Context   10/14/2016  7:31 PM 10/18/2016  8:29 PM Full Code [id number]  Baxter Hire, MD Inpatient   02/08/2015  4:29 AM 02/14/2015  1:37 PM Full Code [id number]  Harrie Foreman, MD Inpatient    Advance Directive Documentation     Most Recent Value  Type of Advance Directive  Healthcare Power of Attorney  Pre-existing out of facility DNR order (yellow form or pink MOST form)  -  "MOST" Form in Place?  -      TOTAL TIME TAKING CARE OF THIS PATIENT ON DAY OF DISCHARGE: more than 30 minutes.   Hillary Bow R M.D on 10/20/2016 at 10:22 AM  Between 7am to 6pm - Pager - [phone]  After 6pm go to [email or web address] - password [email or web address] [place] [facility]  Office  [phone]  CC: Primary care physician; Raelyn Number, MD  Note: This dictation  was prepared with Dragon dictation along with  smaller phrase technology. Any transcriptional errors that result from this process are unintentional.

## 2016-10-20 NOTE — Progress Notes (Signed)
New referral for Home Palliative received from [name] NP. CMRN Marshell Garfinkel made aware. Patient information faxed to referral. Patient to discharge today. Thank you. Flo Shanks RN, BSN, [place] [facility] [place], [facility] [phone] c

## 2016-10-21 ENCOUNTER — Other Ambulatory Visit: Payer: Self-pay | Admitting: *Deleted

## 2016-10-21 DIAGNOSIS — C7931 Secondary malignant neoplasm of brain: Secondary | ICD-10-CM

## 2016-10-21 DIAGNOSIS — Z510 Encounter for antineoplastic radiation therapy: Secondary | ICD-10-CM | POA: Diagnosis not present

## 2016-10-21 DIAGNOSIS — F17210 Nicotine dependence, cigarettes, uncomplicated: Secondary | ICD-10-CM | POA: Diagnosis not present

## 2016-10-21 DIAGNOSIS — C3411 Malignant neoplasm of upper lobe, right bronchus or lung: Secondary | ICD-10-CM | POA: Diagnosis not present

## 2016-10-25 ENCOUNTER — Ambulatory Visit
Admission: RE | Admit: 2016-10-25 | Discharge: 2016-10-25 | Disposition: A | Discharge status: Home / Self Care | Payer: Medicare Other | Source: Ambulatory Visit | Attending: Radiation Oncology | Admitting: Radiation Oncology

## 2016-10-25 ENCOUNTER — Inpatient Hospital Stay: Payer: Medicare Other | Attending: Hematology and Oncology | Admitting: Hematology and Oncology

## 2016-10-25 VITALS — BP 127/74 | HR 76 | Temp 97.3°F | Resp 18 | Ht 60.0 in | Wt 76.1 lb

## 2016-10-25 DIAGNOSIS — W19XXXS Unspecified fall, sequela: Secondary | ICD-10-CM | POA: Insufficient documentation

## 2016-10-25 DIAGNOSIS — C3411 Malignant neoplasm of upper lobe, right bronchus or lung: Secondary | ICD-10-CM | POA: Diagnosis present

## 2016-10-25 DIAGNOSIS — S2241XS Multiple fractures of ribs, right side, sequela: Secondary | ICD-10-CM

## 2016-10-25 DIAGNOSIS — R590 Localized enlarged lymph nodes: Secondary | ICD-10-CM | POA: Insufficient documentation

## 2016-10-25 DIAGNOSIS — F17210 Nicotine dependence, cigarettes, uncomplicated: Secondary | ICD-10-CM | POA: Insufficient documentation

## 2016-10-25 DIAGNOSIS — N6489 Other specified disorders of breast: Secondary | ICD-10-CM | POA: Insufficient documentation

## 2016-10-25 DIAGNOSIS — K869 Disease of pancreas, unspecified: Secondary | ICD-10-CM | POA: Diagnosis not present

## 2016-10-25 DIAGNOSIS — Z880 Allergy status to penicillin: Secondary | ICD-10-CM | POA: Insufficient documentation

## 2016-10-25 DIAGNOSIS — R42 Dizziness and giddiness: Secondary | ICD-10-CM | POA: Diagnosis not present

## 2016-10-25 DIAGNOSIS — C7972 Secondary malignant neoplasm of left adrenal gland: Secondary | ICD-10-CM | POA: Insufficient documentation

## 2016-10-25 DIAGNOSIS — I2510 Atherosclerotic heart disease of native coronary artery without angina pectoris: Secondary | ICD-10-CM | POA: Diagnosis not present

## 2016-10-25 DIAGNOSIS — Z8042 Family history of malignant neoplasm of prostate: Secondary | ICD-10-CM | POA: Diagnosis not present

## 2016-10-25 DIAGNOSIS — D7389 Other diseases of spleen: Secondary | ICD-10-CM | POA: Diagnosis not present

## 2016-10-25 DIAGNOSIS — F0280 Dementia in other diseases classified elsewhere without behavioral disturbance: Secondary | ICD-10-CM | POA: Diagnosis not present

## 2016-10-25 DIAGNOSIS — E785 Hyperlipidemia, unspecified: Secondary | ICD-10-CM | POA: Insufficient documentation

## 2016-10-25 DIAGNOSIS — G309 Alzheimer's disease, unspecified: Secondary | ICD-10-CM

## 2016-10-25 DIAGNOSIS — Z803 Family history of malignant neoplasm of breast: Secondary | ICD-10-CM | POA: Diagnosis not present

## 2016-10-25 DIAGNOSIS — C7931 Secondary malignant neoplasm of brain: Secondary | ICD-10-CM | POA: Insufficient documentation

## 2016-10-25 DIAGNOSIS — R634 Abnormal weight loss: Secondary | ICD-10-CM | POA: Diagnosis not present

## 2016-10-25 DIAGNOSIS — S2241XD Multiple fractures of ribs, right side, subsequent encounter for fracture with routine healing: Secondary | ICD-10-CM

## 2016-10-25 DIAGNOSIS — Z510 Encounter for antineoplastic radiation therapy: Secondary | ICD-10-CM | POA: Diagnosis not present

## 2016-10-25 DIAGNOSIS — Z8542 Personal history of malignant neoplasm of other parts of uterus: Secondary | ICD-10-CM | POA: Insufficient documentation

## 2016-10-25 DIAGNOSIS — Z79899 Other long term (current) drug therapy: Secondary | ICD-10-CM | POA: Diagnosis not present

## 2016-10-25 DIAGNOSIS — C770 Secondary and unspecified malignant neoplasm of lymph nodes of head, face and neck: Secondary | ICD-10-CM | POA: Insufficient documentation

## 2016-10-25 DIAGNOSIS — K8689 Other specified diseases of pancreas: Secondary | ICD-10-CM | POA: Insufficient documentation

## 2016-10-25 DIAGNOSIS — R531 Weakness: Secondary | ICD-10-CM | POA: Diagnosis not present

## 2016-10-25 DIAGNOSIS — Z7189 Other specified counseling: Secondary | ICD-10-CM

## 2016-10-25 NOTE — Progress Notes (Signed)
[facility] day:  10/25/2016  Chief Complaint: Traci Holmes is a 71 y.o. female with metastatic non-small cell lung cancer who is seen for initial assessment after interval hospitalization.  HPI:  The patient lives in [facility] assisted living.  She has dementia, but is able to take care of all of her activities of daily living.  She has a > 50 pack year smoking history.  She presented with bilateral adenopathy.  She was seen in the [facility] ER on 10/03/2016 with generalized weakness and fatigue.  Chest x-ray revealed hyperinflation with emphysematous changes and a right apical asymmetric opacity.  Chest CT was recommended.  She was treated with azithromycin, prednisone and albuterol.  She was seen by Dr. Jamal Collin on 10/04/2016 for adenopathy. She was noted to have mid and upper anterior cervical nodes bilaterally (5 mm - 2 cm).  She was referred for additional imaging.  Neck CT on 10/11/2016 revealed bilateral cervical adenopathy consistent with metastatic disease.  There was a 1.8 x 1.9 cm left level II lymph node. There were bilateral cm size level II, left level III, and bilateral level IV adenopathy. 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   Chest CT on 10/11/2016 revealed a 3.6 cm posterior apical right upper lobe mass compatible with primary bronchogenic carcinoma. There was a new 1.3 cm central right upper lobe and 0.5 cm anterior right lower lobe pulmonary nodule. There was bilateral hilar, bilateral mediastinal, bilateral supraclavicular, and porta hepatis adenopathy. There was a new left adrenal nodule. There was a 4.4 cm partially visualized pancreatic tail cystic mass, increased in size, indeterminate for cystic pancreatic malignancy.  There were indeterminate small bilateral low attenuation renal masses and a new small low-attenuation splenic lesion. There was  focal nodular asymmetry in the deep upper-outer right breast.  She has left main and three-vessel coronary artery disease.  She underwent left upper cervical lymph node core biopsy or ultrasound guidance on 10/12/2016 by Dr. Jamal Collin.  Pathology revealed poorly differentiated carcinoma.  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  Tumor was cytokeratin AE1/AE3 positive and negative for CD45, chromogranin, synaptophysin, TTF-1, and weakly diffuse positive for  neuron specific enolase.  She presented to the [facility] ER on 10/14/2016 after falling in the shower.  She had noted some dizziness.  She denies any other symptoms except for right sided rib pain following her fall.  CXR on 10/14/2016 revealed possible nondisplaced fracture of the right seventh rib with new small right pleural effusion and mild right basilar atelectasis.  Head CT without contrast on 10/14/2016 revealed multiple areas of vasogenic edema in the brain with focal low-density lesions visible in the left frontal and left parietal lobes. Edema causes 2 mm left-to-right midline shift.  She was admitted to [facility] from 10/14/2016 - 10/18/2016.  Head MRI on 10/15/2016 revealed multiple metastatic lesions throughout the brain.  There was mild LEFT-to-RIGHT shift of 1-2 mm. The multiplicity and size of BILATERAL cerebellar lesions puts the patient at risk for obstructive hydrocephalus.  She was started on Decadron and Keppra.  She received Tramadol and a lidocaine patch for her rib fracture.  She was admitted to [facility] from 10/19/2016  - 10/20/2016 after slipping and falling on her right side.  Rib films revealed minimally displaced fractures of lateral 7th, 8th, and 9th ribs.  Oxygen sats were 90% on  RA and improved to the mid 90s with nasal cannula.  She was admitted for pain control and respiratory therapy. She was placed on supplemental oxygen at 2L/[place] around the clock.  She met with palliative  care.  Symptomatically, she is "extremely weak".  She requires assistance with her ADLs. Patient reports that pain is well controlled. The prescribed Lidoderm patches were too expensive. She is now on oxycodone.  She has taken "4 doses" since 10/22/2016. Her breathing is stable.  She denies shortness of breath.  Appetite is poor per caregiver ("picks at food").  She states that she is "eating like a pig".  Weight down 6 pounds since 10/12/2016.  She is taking Decadron '4mg'$  TID. Patient to see radiation oncology today for simulation.  First radiation treatment is schedule for tomorrow. Per caregiver, she is scheduled to have 18 treatments.    Past Medical History:  Diagnosis Date  . Alzheimer disease   . Aphasia   . Cancer [facility])    Uterine Cancer-30 years ago with hysterectomy.  . Dementia   . Hyperlipemia   . Hypocalcemia   . Major neurocognitive disorder, unspecified 08/25/2014  . Seizures ([place])     Past Surgical History:  Procedure Laterality Date  . ABDOMINAL HYSTERECTOMY     Does not remember date 30-40 years ago?    Family History  Problem Relation Age of Onset  . Cancer Mother        breast  . Cancer Father        prostate    Social History:  reports that she has been smoking Cigarettes.  She has been smoking about 1.00 pack per day. She has never used smokeless tobacco. She reports that she does not drink alcohol or use drugs.  She lives at [facility] assisted living.  She has dementia. She  has 2 sons [name] lives in [place]).  She states that she wishes for [name] to be her medical power of attorney (no paperwork completed to-date). She also states her niece is "power of attorney", but does not wish her to make medical decisions for her if she cannot speak for herself. Patient is accompanied by Thayer Headings Civil engineer, contracting) from [place].  As of 10/20/2016, DSS is her guardian.  Milana Kidney from [place] [phone]) should be contacted regarding patient decisions.  Allergies:   Allergies  Allergen Reactions  . Penicillins Other (See Comments)    Reaction: unknown  Patient cannot answer follow-up questions    Current Medications: Current Outpatient Prescriptions  Medication Sig Dispense Refill  . acetaminophen (TYLENOL) 500 MG tablet Take 500 mg by mouth every 6 (six) hours as needed for fever or headache.     . albuterol (PROVENTIL HFA;VENTOLIN HFA) 108 (90 Base) MCG/ACT inhaler Inhale 2 puffs into the lungs every 6 (six) hours as needed for wheezing or shortness of breath. 1 Inhaler 0  . ARIPiprazole (ABILIFY) 2 MG tablet Take 1 mg by mouth daily.    . Cholecalciferol (VITAMIN D3) 50000 units CAPS Take by mouth.    . dexamethasone (DECADRON) 4 MG tablet Take 1 tablet (4 mg total) by mouth 3 (three) times daily. 30 tablet 0  . docusate sodium (COLACE) 100 MG capsule Take 2 capsules (200 mg total) by mouth 2 (two) times daily. 10 capsule 0  . fluticasone (FLONASE) 50 MCG/ACT nasal spray Place 1 spray into both nostrils 2 (two) times daily.    Marland Kitchen lactose free nutrition (BOOST) LIQD Take 237 mLs by mouth 3 (three) times daily between  meals.    . levETIRAcetam (KEPPRA) 500 MG tablet Take 1 tablet (500 mg total) by mouth 2 (two) times daily. (Patient taking differently: Take 500 mg by mouth 3 (three) times daily. ) 10 tablet 0  . memantine (NAMENDA) 10 MG tablet Take 10 mg by mouth 2 (two) times daily.    Marland Kitchen oxyCODONE (OXY IR/ROXICODONE) 5 MG immediate release tablet Take 1 tablet (5 mg total) by mouth every 4 (four) hours as needed for severe pain. 30 tablet 0  . promethazine (PHENERGAN) 25 MG tablet Take 25 mg by mouth every 6 (six) hours as needed for nausea or vomiting.    . sertraline (ZOLOFT) 100 MG tablet Take 100 mg by mouth daily.    Marland Kitchen Spacer/Aero Chamber Mouthpiece MISC 1 Units by Does not apply route every 4 (four) hours as needed (wheezing). 1 each 0  . traMADol (ULTRAM) 50 MG tablet Take 50 mg by mouth every 6 (six) hours as needed.    . Vitamin D,  Ergocalciferol, (DRISDOL) 50000 units CAPS capsule Take 50,000 Units by mouth every 7 (seven) days.     No current facility-administered medications for this visit.     Review of Systems:  GENERAL:  Weak.  No fevers or sweats.  Weight loss of 6 pounds since 10/12/2016. PERFORMANCE STATUS (ECOG):  2-3. HEENT:  No visual changes, runny nose, sore throat, mouth sores or tenderness. Lungs: No shortness of breath or cough.  No hemoptysis. On oxygen 2 liter/min via [place]. Cardiac:  No chest pain, palpitations, orthopnea, or PND. GI:  Eating poorly per caregiver.  No nausea, vomiting, diarrhea, constipation, melena or hematochezia.  Unaware of last colonoscopy. GU:  No urgency, frequency, dysuria, or hematuria. Musculoskeletal:  Right rib pain after fall (right 7th, 8th, and 9th rib fracture).  No back pain.  No joint pain.  No muscle tenderness. Extremities:  No pain or swelling. Skin:  No rashes or skin changes. Neuro:  No headache, numbness or weakness, balance or coordination issues. Endocrine:  No diabetes, thyroid issues, hot flashes or night sweats. Psych:  No mood changes, depression or anxiety. Pain:  Rib pain (see HPI). Review of systems:  All other systems reviewed and found to be negative.  Physical Exam: Blood pressure 127/74, pulse 76, temperature (!) 97.3 F (36.3 C), temperature source Tympanic, resp. rate 18, height 5' (1.524 m), weight 76 lb 2 oz (34.5 kg). GENERAL:  Thin pale woman sitting comfortably on the medical unit in no acute distress. MENTAL STATUS:  Alert and oriented to person, place and time. HEAD:  Long brown hair.  Normocephalic, atraumatic, face symmetric, no Cushingoid features. EYES:  Blue eyes.  Pupils equal round and reactive to light and accomodation.  No conjunctivitis or scleral icterus. ENT:  [place] in place.  Oropharynx clear without lesion.  Tongue normal. Mucous membranes moist.  RESPIRATORY:  Clear to auscultation without rales, wheezes or  rhonchi. CARDIOVASCULAR:  Regular rate and rhythm without murmur, rub or gallop. ABDOMEN:  Soft, non-tender, with active bowel sounds, and no hepatosplenomegaly.  No masses. SKIN:  Pale.  No rashes, ulcers or lesions. EXTREMITIES: No edema, no skin discoloration or tenderness.  No palpable cords. LYMPH NODES: Bilateral 1-2 cm palpable cervical adenopathy (right > left).  No supraclavicular, axillary or inguinal adenopathy  NEUROLOGICAL: Appropriate.  PSYCH:  Appropriate.   No visits with results within 3 Day(s) from this visit.  Latest known visit with results is:  Admission on 10/19/2016, Discharged on 10/20/2016  Component Date  Value Ref Range Status  . Sodium 10/19/2016 134* 135 - 145 mmol/L Final  . Potassium 10/19/2016 4.6  3.5 - 5.1 mmol/L Final  . Chloride 10/19/2016 95* 101 - 111 mmol/L Final  . CO2 10/19/2016 29  22 - 32 mmol/L Final  . Glucose, Bld 10/19/2016 131* 65 - 99 mg/dL Final  . BUN 10/19/2016 31* 6 - 20 mg/dL Final  . Creatinine, Ser 10/19/2016 0.62  0.44 - 1.00 mg/dL Final  . Calcium 10/19/2016 9.5  8.9 - 10.3 mg/dL Final  . GFR calc non Af Amer 10/19/2016 >60  >60 mL/min Final  . GFR calc Af Amer 10/19/2016 >60  >60 mL/min Final   Comment: (NOTE) The eGFR has been calculated using the CKD EPI equation. This calculation has not been validated in all clinical situations. eGFR's persistently <60 mL/min signify possible Chronic Kidney Disease.   . Anion gap 10/19/2016 10  5 - 15 Final  . WBC 10/19/2016 13.4* 3.6 - 11.0 K/uL Final  . RBC 10/19/2016 4.51  3.80 - 5.20 MIL/uL Final  . Hemoglobin 10/19/2016 14.2  12.0 - 16.0 g/dL Final  . HCT 10/19/2016 42.8  35.0 - 47.0 % Final  . MCV 10/19/2016 94.8  80.0 - 100.0 fL Final  . MCH 10/19/2016 31.5  26.0 - 34.0 pg Final  . MCHC 10/19/2016 33.2  32.0 - 36.0 g/dL Final  . RDW 10/19/2016 13.8  11.5 - 14.5 % Final  . Platelets 10/19/2016 248  150 - 440 K/uL Final  . WBC 10/19/2016 11.7* 3.6 - 11.0 K/uL Final  . RBC  10/19/2016 4.43  3.80 - 5.20 MIL/uL Final  . Hemoglobin 10/19/2016 14.2  12.0 - 16.0 g/dL Final  . HCT 10/19/2016 42.1  35.0 - 47.0 % Final  . MCV 10/19/2016 95.0  80.0 - 100.0 fL Final  . MCH 10/19/2016 32.0  26.0 - 34.0 pg Final  . MCHC 10/19/2016 33.7  32.0 - 36.0 g/dL Final  . RDW 10/19/2016 14.1  11.5 - 14.5 % Final  . Platelets 10/19/2016 234  150 - 440 K/uL Final  . Creatinine, Ser 10/19/2016 0.50  0.44 - 1.00 mg/dL Final  . GFR calc non Af Amer 10/19/2016 >60  >60 mL/min Final  . GFR calc Af Amer 10/19/2016 >60  >60 mL/min Final   Comment: (NOTE) The eGFR has been calculated using the CKD EPI equation. This calculation has not been validated in all clinical situations. eGFR's persistently <60 mL/min signify possible Chronic Kidney Disease.     Assessment:  Traci Holmes is a 71 y.o. female with stage IV non-small cell lung cancer.  She presented with neck adenopathy and dizziness s/p a fall.  Cervical lymph node biopsy on 10/12/2016 revealed a poorly differentiated carcinoma.  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  Tumor was cytokeratin AE1/AE3 positive and negative for CD45, chromogranin, synaptophysin, TTF-1, and weakly diffuse positive for  neuron specific enolase.  Neck CT on 10/11/2016 revealed bilateral cervical adenopathy.  There was a 1.8 x 1.9 cm left level II lymph node. There were bilateral cm size level II, left level III, and bilateral level IV adenopathy. 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   Chest CT on 10/11/2016 revealed a 3.6 cm posterior apical right upper lobe mass compatible with primary bronchogenic carcinoma. There was a new 1.3 cm central right upper lobe and 0.5 cm anterior  right lower lobe pulmonary nodule. There was bilateral hilar, bilateral mediastinal, bilateral supraclavicular, and porta hepatis adenopathy. There  was a new left adrenal nodule. There was a 4.4 cm partially visualized pancreatic tail cystic mass, increased in size, indeterminate for cystic pancreatic malignancy.  There were indeterminate small bilateral low attenuation renal masses and a new small low-attenuation splenic lesion. There was focal nodular asymmetry in the deep upper-outer right breast.   Breast exam and ultrasound revealed a 1.5 cm simple cyst.  Head MRI on 10/15/2016 revealed multiple metastatic lesions throughout the brain.  She had mild LEFT-to-RIGHT shift of 1-2 mm.  She is on Decadron and Keppra.  She has dementia.  She has had several falls.  Rib films on 10/19/2016 revealed minimally displaced fractures of lateral 7th, 8th, and 9th ribs.   Symptomatically, she is weak.  She needs assistance with her ADLs.  Exam reveals bilateral cervical adenopathy.  Plan: 1.  Review diagnosis and management of metastatic squamous cell lung cancer.  Treatment is palliative.  Plan is for whole brain radiation secondary to multiple brain metastasis.  Discuss patient's thoughts about systemic therapy versus supportive care.  Foundation One testing and PD-1 testing have been sent. Side effect profile of anticipated regimen discussed with patient and caregiver.   Plan to await test results and involve DSS as they are the guardian for the patient. 2.  Follow up with radiation oncology as scheduled today for simulation.  Will return on 10/26/2016 for initial radiation treatment. Current plan of care includes 18 fractions of radiation.  Decadron dose to be clarified today when she sees Dr. Donella Stade. 3.  Discuss importance of caloric intake.  Discuss assistance at care facility with eating.  Discuss Ensure or Boost. 4.  RTC in 2 weeks for MD assessment and to finalize treatment plan. Will try to have DSS representative and son [name]) present for shared decision making purposes.      Honor Loh, NP  10/25/2016, 11:39 AM   I saw and evaluated the  patient, participating in the key portions of the service and reviewing pertinent diagnostic studies and records.  I reviewed the nurse practitioner's note and agree with the findings and the plan.  The assessment and plan were discussed with the patient.   Multiple questions were asked and answered.   Lequita Asal, MD 10/25/2016,1:27 PM

## 2016-10-25 NOTE — Progress Notes (Signed)
Patient here today as hospital follow up.  Offers no complaints today.  Patient on 2 L O2.  Presents in wheelchair, accompanied by her caretaker, Clancy Gourd, who is the director of [facility].

## 2016-10-26 ENCOUNTER — Encounter: Payer: Self-pay | Admitting: Hematology and Oncology

## 2016-10-26 ENCOUNTER — Ambulatory Visit
Admission: RE | Admit: 2016-10-26 | Discharge: 2016-10-26 | Disposition: A | Discharge status: Home / Self Care | Payer: Medicare Other | Source: Ambulatory Visit | Attending: Radiation Oncology | Admitting: Radiation Oncology

## 2016-10-26 DIAGNOSIS — Z510 Encounter for antineoplastic radiation therapy: Secondary | ICD-10-CM | POA: Diagnosis not present

## 2016-10-27 ENCOUNTER — Ambulatory Visit
Admission: RE | Admit: 2016-10-27 | Discharge: 2016-10-27 | Disposition: A | Discharge status: Home / Self Care | Payer: Medicare Other | Source: Ambulatory Visit | Attending: Radiation Oncology | Admitting: Radiation Oncology

## 2016-10-27 DIAGNOSIS — Z510 Encounter for antineoplastic radiation therapy: Secondary | ICD-10-CM | POA: Diagnosis not present

## 2016-10-28 ENCOUNTER — Ambulatory Visit
Admission: RE | Admit: 2016-10-28 | Discharge: 2016-10-28 | Disposition: A | Discharge status: Home / Self Care | Payer: Medicare Other | Source: Ambulatory Visit | Attending: Radiation Oncology | Admitting: Radiation Oncology

## 2016-10-28 DIAGNOSIS — Z510 Encounter for antineoplastic radiation therapy: Secondary | ICD-10-CM | POA: Diagnosis not present

## 2016-10-31 ENCOUNTER — Ambulatory Visit
Admission: RE | Admit: 2016-10-31 | Discharge: 2016-10-31 | Disposition: A | Discharge status: Home / Self Care | Payer: Medicare Other | Source: Ambulatory Visit | Attending: Radiation Oncology | Admitting: Radiation Oncology

## 2016-10-31 DIAGNOSIS — Z510 Encounter for antineoplastic radiation therapy: Secondary | ICD-10-CM | POA: Diagnosis not present

## 2016-11-01 ENCOUNTER — Ambulatory Visit
Admission: RE | Admit: 2016-11-01 | Discharge: 2016-11-01 | Disposition: A | Discharge status: Home / Self Care | Payer: Medicare Other | Source: Ambulatory Visit | Attending: Radiation Oncology | Admitting: Radiation Oncology

## 2016-11-01 DIAGNOSIS — Z510 Encounter for antineoplastic radiation therapy: Secondary | ICD-10-CM | POA: Diagnosis not present

## 2016-11-02 ENCOUNTER — Ambulatory Visit
Admission: RE | Admit: 2016-11-02 | Discharge: 2016-11-02 | Disposition: A | Discharge status: Home / Self Care | Payer: Medicare Other | Source: Ambulatory Visit | Attending: Radiation Oncology | Admitting: Radiation Oncology

## 2016-11-02 ENCOUNTER — Inpatient Hospital Stay: Payer: Medicare Other

## 2016-11-02 DIAGNOSIS — Z510 Encounter for antineoplastic radiation therapy: Secondary | ICD-10-CM | POA: Diagnosis not present

## 2016-11-03 ENCOUNTER — Ambulatory Visit
Admission: RE | Admit: 2016-11-03 | Discharge: 2016-11-03 | Disposition: A | Discharge status: Home / Self Care | Payer: Medicare Other | Source: Ambulatory Visit | Attending: Radiation Oncology | Admitting: Radiation Oncology

## 2016-11-03 DIAGNOSIS — Z510 Encounter for antineoplastic radiation therapy: Secondary | ICD-10-CM | POA: Diagnosis not present

## 2016-11-04 ENCOUNTER — Encounter (HOSPITAL_COMMUNITY): Payer: Self-pay

## 2016-11-04 ENCOUNTER — Ambulatory Visit
Admission: RE | Admit: 2016-11-04 | Discharge: 2016-11-04 | Disposition: A | Discharge status: Home / Self Care | Payer: Medicare Other | Source: Ambulatory Visit | Attending: Radiation Oncology | Admitting: Radiation Oncology

## 2016-11-04 DIAGNOSIS — Z510 Encounter for antineoplastic radiation therapy: Secondary | ICD-10-CM | POA: Diagnosis not present

## 2016-11-07 ENCOUNTER — Ambulatory Visit: Payer: Medicare Other

## 2016-11-08 ENCOUNTER — Inpatient Hospital Stay (HOSPITAL_BASED_OUTPATIENT_CLINIC_OR_DEPARTMENT_OTHER): Payer: Medicare Other | Admitting: Hematology and Oncology

## 2016-11-08 ENCOUNTER — Encounter: Payer: Self-pay | Admitting: Hematology and Oncology

## 2016-11-08 ENCOUNTER — Ambulatory Visit
Admission: RE | Admit: 2016-11-08 | Discharge: 2016-11-08 | Disposition: A | Discharge status: Home / Self Care | Payer: Medicare Other | Source: Ambulatory Visit | Attending: Radiation Oncology | Admitting: Radiation Oncology

## 2016-11-08 ENCOUNTER — Ambulatory Visit: Payer: Medicare Other

## 2016-11-08 VITALS — BP 114/77 | HR 82 | Temp 97.8°F | Wt 77.8 lb

## 2016-11-08 DIAGNOSIS — C770 Secondary and unspecified malignant neoplasm of lymph nodes of head, face and neck: Secondary | ICD-10-CM | POA: Diagnosis not present

## 2016-11-08 DIAGNOSIS — C7931 Secondary malignant neoplasm of brain: Secondary | ICD-10-CM | POA: Diagnosis not present

## 2016-11-08 DIAGNOSIS — D7389 Other diseases of spleen: Secondary | ICD-10-CM

## 2016-11-08 DIAGNOSIS — E785 Hyperlipidemia, unspecified: Secondary | ICD-10-CM

## 2016-11-08 DIAGNOSIS — C7972 Secondary malignant neoplasm of left adrenal gland: Secondary | ICD-10-CM

## 2016-11-08 DIAGNOSIS — C3411 Malignant neoplasm of upper lobe, right bronchus or lung: Secondary | ICD-10-CM | POA: Diagnosis not present

## 2016-11-08 DIAGNOSIS — R531 Weakness: Secondary | ICD-10-CM | POA: Diagnosis not present

## 2016-11-08 DIAGNOSIS — G309 Alzheimer's disease, unspecified: Secondary | ICD-10-CM

## 2016-11-08 DIAGNOSIS — S2241XS Multiple fractures of ribs, right side, sequela: Secondary | ICD-10-CM | POA: Diagnosis not present

## 2016-11-08 DIAGNOSIS — Z880 Allergy status to penicillin: Secondary | ICD-10-CM

## 2016-11-08 DIAGNOSIS — W19XXXS Unspecified fall, sequela: Secondary | ICD-10-CM

## 2016-11-08 DIAGNOSIS — Z79899 Other long term (current) drug therapy: Secondary | ICD-10-CM

## 2016-11-08 DIAGNOSIS — Z8542 Personal history of malignant neoplasm of other parts of uterus: Secondary | ICD-10-CM

## 2016-11-08 DIAGNOSIS — F0280 Dementia in other diseases classified elsewhere without behavioral disturbance: Secondary | ICD-10-CM

## 2016-11-08 DIAGNOSIS — E869 Volume depletion, unspecified: Secondary | ICD-10-CM

## 2016-11-08 DIAGNOSIS — I2510 Atherosclerotic heart disease of native coronary artery without angina pectoris: Secondary | ICD-10-CM

## 2016-11-08 DIAGNOSIS — R590 Localized enlarged lymph nodes: Secondary | ICD-10-CM | POA: Diagnosis not present

## 2016-11-08 DIAGNOSIS — N6489 Other specified disorders of breast: Secondary | ICD-10-CM

## 2016-11-08 DIAGNOSIS — Z8042 Family history of malignant neoplasm of prostate: Secondary | ICD-10-CM

## 2016-11-08 DIAGNOSIS — Z803 Family history of malignant neoplasm of breast: Secondary | ICD-10-CM

## 2016-11-08 DIAGNOSIS — Z510 Encounter for antineoplastic radiation therapy: Secondary | ICD-10-CM | POA: Diagnosis not present

## 2016-11-08 DIAGNOSIS — F17210 Nicotine dependence, cigarettes, uncomplicated: Secondary | ICD-10-CM

## 2016-11-08 NOTE — Progress Notes (Signed)
[facility] day:  11/08/2016  Chief Complaint: Traci Holmes is a 71 y.o. female with metastatic non-small cell lung cancer who is seen for 2 week assessment.  HPI:  The patient was last seen in the medical oncology clinic on 10/25/2016.  At that time, she was seen after recent hospitalization.  She was weak.  She needed assistance with her ADLs.  She has dementia.  She has had several falls.  Rib films revealed minimally displaced fractures of lateral 7th, 8th, and 9th ribs. She lived at [facility] assisted living.   We discussed palliative treatment for metastatic lung cancer.  Plan was for whole brain radiation secondary to multiple brain metastasis.  She was considering systemic therapy versus supportive care.  Foundation One testing and PD-1 testing was pending.    She began whole brain radiation on 10/26/2016.  She received treatment on 11/04/2016.  PDL-1 testing was negative (TPS 0%).  Symptomatically, she is doing well. She denies physical complaints.  Patient continues Decadron as previously prescribed.  She is currently receiving radiation treatments.  Last CNS radiation treatment is tomorrow.  She  denies nausea, vomiting, bleeding, mouth sores, and skin rashes.  She is eating well.  Weight is up 1 pound since last visit.    Past Medical History:  Diagnosis Date  . Alzheimer disease   . Aphasia   . Cancer [facility])    Uterine Cancer-30 years ago with hysterectomy.  . Dementia   . Hyperlipemia   . Hypocalcemia   . Major neurocognitive disorder, unspecified 08/25/2014  . Seizures ([place])     Past Surgical History:  Procedure Laterality Date  . ABDOMINAL HYSTERECTOMY     Does not remember date 30-40 years ago?    Family History  Problem Relation Age of Onset  . Cancer Mother        breast  . Cancer Father        prostate    Social History:  reports that she has been smoking Cigarettes.  She has been smoking about 1.00 pack per day.  She has never used smokeless tobacco. She reports that she does not drink alcohol or use drugs.  She lives at [facility] assisted living.  She has dementia. She  has 2 sons [name] lives in [place]).  She states that she wishes for [name] to be her medical power of attorney (no paperwork completed to-date). She also states her niece is "power of attorney", but does not wish her to make medical decisions for her if she cannot speak for herself.  Thayer Headings Civil engineer, contracting) from [place] and [place] representative (Ms. Iantha Fallen). Milana Kidney from [place] [phone]) should be contacted regarding patient decisions.  She is accompanied by her gaurdian, Iantha Fallen.  Allergies:  Allergies  Allergen Reactions  . Penicillins Other (See Comments)    Reaction: unknown  Patient cannot answer follow-up questions    Current Medications: Current Outpatient Prescriptions  Medication Sig Dispense Refill  . acetaminophen (TYLENOL) 500 MG tablet Take 500 mg by mouth every 6 (six) hours as needed for fever or headache.     . albuterol (PROVENTIL HFA;VENTOLIN HFA) 108 (90 Base) MCG/ACT inhaler Inhale 2 puffs into the lungs every 6 (six) hours as needed for wheezing or shortness of breath. 1 Inhaler 0  . ARIPiprazole (ABILIFY) 2 MG tablet Take 1 mg by mouth daily.    . Cholecalciferol (VITAMIN D3) 50000 units CAPS Take by mouth.    Marland Kitchen  dexamethasone (DECADRON) 4 MG tablet Take 1 tablet (4 mg total) by mouth 3 (three) times daily. 30 tablet 0  . docusate sodium (COLACE) 100 MG capsule Take 2 capsules (200 mg total) by mouth 2 (two) times daily. 10 capsule 0  . fluticasone (FLONASE) 50 MCG/ACT nasal spray Place 1 spray into both nostrils 2 (two) times daily.    Marland Kitchen lactose free nutrition (BOOST) LIQD Take 237 mLs by mouth 3 (three) times daily between meals.    . levETIRAcetam (KEPPRA) 500 MG tablet Take 1 tablet (500 mg total) by mouth 2 (two) times daily. (Patient taking differently: Take 500 mg by mouth 3  (three) times daily. ) 10 tablet 0  . memantine (NAMENDA) 10 MG tablet Take 10 mg by mouth 2 (two) times daily.    Marland Kitchen oxyCODONE (OXY IR/ROXICODONE) 5 MG immediate release tablet Take 1 tablet (5 mg total) by mouth every 4 (four) hours as needed for severe pain. 30 tablet 0  . promethazine (PHENERGAN) 25 MG tablet Take 25 mg by mouth every 6 (six) hours as needed for nausea or vomiting.    . sertraline (ZOLOFT) 100 MG tablet Take 100 mg by mouth daily.    Marland Kitchen Spacer/Aero Chamber Mouthpiece MISC 1 Units by Does not apply route every 4 (four) hours as needed (wheezing). 1 each 0  . traMADol (ULTRAM) 50 MG tablet Take 50 mg by mouth every 6 (six) hours as needed.    . Vitamin D, Ergocalciferol, (DRISDOL) 50000 units CAPS capsule Take 50,000 Units by mouth every 7 (seven) days.     No current facility-administered medications for this visit.     Review of Systems:  GENERAL:  Feels "fine".  Weak.  No fevers or sweats.  Weight loss of 1 pound since last visit. PERFORMANCE STATUS (ECOG):  2-3. HEENT:  No visual changes, runny nose, sore throat, mouth sores or tenderness. Lungs: No shortness of breath or cough.  No hemoptysis. On oxygen 2 liter/min via [place]. Cardiac:  No chest pain, palpitations, orthopnea, or PND. GI:   No nausea, vomiting, diarrhea, constipation, melena or hematochezia.  Unaware of last colonoscopy. GU:  No urgency, frequency, dysuria, or hematuria. Musculoskeletal:  Right rib pain after fall (right 7th, 8th, and 9th rib fracture).  No back pain.  No joint pain.  No muscle tenderness. Extremities:  No pain or swelling. Skin:  No rashes or skin changes. Neuro:  No headache, numbness or weakness, balance or coordination issues. Endocrine:  No diabetes, thyroid issues, hot flashes or night sweats. Psych:  No mood changes, depression or anxiety. Pain:  Rib pain (see HPI). Review of systems:  All other systems reviewed and found to be negative.  Physical Exam: Blood pressure 114/77,  pulse 82, temperature 97.8 F (36.6 C), temperature source Tympanic, weight 77 lb 12.8 oz (35.3 kg), SpO2 98 %. GENERAL:  Thin pale woman sitting comfortably on the medical unit in no acute distress. MENTAL STATUS:  Alert and oriented to person, place and time. HEAD:  Long brown hair.  Normocephalic, atraumatic, face symmetric, no Cushingoid features. EYES:  Blue eyes.  No conjunctivitis or scleral icterus. ENT:  [place] in place.  No thrush.  NEUROLOGICAL: Dementia.  PSYCH:  Appropriate.   No visits with results within 3 Day(s) from this visit.  Latest known visit with results is:  Admission on 10/19/2016, Discharged on 10/20/2016  Component Date Value Ref Range Status  . Sodium 10/19/2016 134* 135 - 145 mmol/L Final  . Potassium 10/19/2016  4.6  3.5 - 5.1 mmol/L Final  . Chloride 10/19/2016 95* 101 - 111 mmol/L Final  . CO2 10/19/2016 29  22 - 32 mmol/L Final  . Glucose, Bld 10/19/2016 131* 65 - 99 mg/dL Final  . BUN 10/19/2016 31* 6 - 20 mg/dL Final  . Creatinine, Ser 10/19/2016 0.62  0.44 - 1.00 mg/dL Final  . Calcium 10/19/2016 9.5  8.9 - 10.3 mg/dL Final  . GFR calc non Af Amer 10/19/2016 >60  >60 mL/min Final  . GFR calc Af Amer 10/19/2016 >60  >60 mL/min Final   Comment: (NOTE) The eGFR has been calculated using the CKD EPI equation. This calculation has not been validated in all clinical situations. eGFR's persistently <60 mL/min signify possible Chronic Kidney Disease.   . Anion gap 10/19/2016 10  5 - 15 Final  . WBC 10/19/2016 13.4* 3.6 - 11.0 K/uL Final  . RBC 10/19/2016 4.51  3.80 - 5.20 MIL/uL Final  . Hemoglobin 10/19/2016 14.2  12.0 - 16.0 g/dL Final  . HCT 10/19/2016 42.8  35.0 - 47.0 % Final  . MCV 10/19/2016 94.8  80.0 - 100.0 fL Final  . MCH 10/19/2016 31.5  26.0 - 34.0 pg Final  . MCHC 10/19/2016 33.2  32.0 - 36.0 g/dL Final  . RDW 10/19/2016 13.8  11.5 - 14.5 % Final  . Platelets 10/19/2016 248  150 - 440 K/uL Final  . WBC 10/19/2016 11.7* 3.6 - 11.0 K/uL  Final  . RBC 10/19/2016 4.43  3.80 - 5.20 MIL/uL Final  . Hemoglobin 10/19/2016 14.2  12.0 - 16.0 g/dL Final  . HCT 10/19/2016 42.1  35.0 - 47.0 % Final  . MCV 10/19/2016 95.0  80.0 - 100.0 fL Final  . MCH 10/19/2016 32.0  26.0 - 34.0 pg Final  . MCHC 10/19/2016 33.7  32.0 - 36.0 g/dL Final  . RDW 10/19/2016 14.1  11.5 - 14.5 % Final  . Platelets 10/19/2016 234  150 - 440 K/uL Final  . Creatinine, Ser 10/19/2016 0.50  0.44 - 1.00 mg/dL Final  . GFR calc non Af Amer 10/19/2016 >60  >60 mL/min Final  . GFR calc Af Amer 10/19/2016 >60  >60 mL/min Final   Comment: (NOTE) The eGFR has been calculated using the CKD EPI equation. This calculation has not been validated in all clinical situations. eGFR's persistently <60 mL/min signify possible Chronic Kidney Disease.     Assessment:  JADDA HUNSUCKER is a 70 y.o. female with stage IV non-small cell lung cancer.  She presented with neck adenopathy and dizziness s/p a fall.  Cervical lymph node biopsy on 10/12/2016 revealed a poorly differentiated carcinoma.  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  Tumor was cytokeratin AE1/AE3 positive and negative for CD45, chromogranin, synaptophysin, TTF-1, and weakly diffuse positive for  neuron specific enolase.  PDL-1 testing was negative (TPS 0%).  Neck CT on 10/11/2016 revealed bilateral cervical adenopathy.  There was a 1.8 x 1.9 cm left level II lymph node. There were bilateral cm size level II, left level III, and bilateral level IV adenopathy. 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   Chest CT on 10/11/2016 revealed a 3.6 cm posterior apical right upper lobe mass compatible with primary bronchogenic carcinoma. There was a new 1.3 cm central right upper lobe and 0.5 cm anterior right lower lobe pulmonary nodule. There was bilateral hilar, bilateral mediastinal,  bilateral  supraclavicular, and porta hepatis adenopathy. There was a new left adrenal nodule. There was a 4.4 cm partially visualized pancreatic tail cystic mass, increased in size, indeterminate for cystic pancreatic malignancy.  There were indeterminate small bilateral low attenuation renal masses and a new small low-attenuation splenic lesion. There was focal nodular asymmetry in the deep upper-outer right breast.   Breast exam and ultrasound revealed a 1.5 cm simple cyst.  Head MRI on 10/15/2016 revealed multiple metastatic lesions throughout the brain.  She had mild LEFT-to-RIGHT shift of 1-2 mm.  She is on Decadron and Keppra.  She has dementia.  She has had several falls.  Rib films on 10/19/2016 revealed minimally displaced fractures of lateral 7th, 8th, and 9th ribs.   Symptomatically, she is weak.  She needs assistance with her ADLs.  Exam reveals bilateral cervical adenopathy.  Plan: 1.  Discuss PDL-1 results.  Await Foundation One testing. 2.  Discuss treatment options. DSS reprresentative with patient today. She advises that she has spoken with palliative care team and they feel as if patient's treatment is futile.  She is not a good candidate for chemotherapy secondary to her dementia.  Focus is on quality of life.  Requesting referral to hospice if IV chemo is needed.  3.  Discuss importance of caloric intake.  Discuss assistance at care facility with eating.  Discuss Ensure or Boost. 4.  RTC on 11/15/2016 for MD assessment and to finalize treatment plans.      Lequita Asal, MD  11/08/2016, 9:41 PM

## 2016-11-08 NOTE — Progress Notes (Signed)
Patient here for follow up. She has gained 4 pounds since her last visit. She states "no changes".

## 2016-11-09 ENCOUNTER — Ambulatory Visit
Admission: RE | Admit: 2016-11-09 | Discharge: 2016-11-09 | Disposition: A | Discharge status: Home / Self Care | Payer: Medicare Other | Source: Ambulatory Visit | Attending: Radiation Oncology | Admitting: Radiation Oncology

## 2016-11-09 DIAGNOSIS — Z510 Encounter for antineoplastic radiation therapy: Secondary | ICD-10-CM | POA: Diagnosis not present

## 2016-11-15 ENCOUNTER — Inpatient Hospital Stay (HOSPITAL_BASED_OUTPATIENT_CLINIC_OR_DEPARTMENT_OTHER): Payer: Medicare Other | Admitting: Hematology and Oncology

## 2016-11-15 ENCOUNTER — Inpatient Hospital Stay: Payer: Medicare Other | Admitting: Hematology and Oncology

## 2016-11-15 ENCOUNTER — Encounter: Payer: Self-pay | Admitting: Hematology and Oncology

## 2016-11-15 VITALS — BP 129/75 | HR 56 | Temp 97.6°F | Resp 12 | Ht 60.0 in | Wt 77.6 lb

## 2016-11-15 DIAGNOSIS — Z79899 Other long term (current) drug therapy: Secondary | ICD-10-CM

## 2016-11-15 DIAGNOSIS — F0280 Dementia in other diseases classified elsewhere without behavioral disturbance: Secondary | ICD-10-CM

## 2016-11-15 DIAGNOSIS — C7931 Secondary malignant neoplasm of brain: Secondary | ICD-10-CM | POA: Diagnosis not present

## 2016-11-15 DIAGNOSIS — F17210 Nicotine dependence, cigarettes, uncomplicated: Secondary | ICD-10-CM

## 2016-11-15 DIAGNOSIS — R590 Localized enlarged lymph nodes: Secondary | ICD-10-CM

## 2016-11-15 DIAGNOSIS — R634 Abnormal weight loss: Secondary | ICD-10-CM | POA: Diagnosis not present

## 2016-11-15 DIAGNOSIS — C7972 Secondary malignant neoplasm of left adrenal gland: Secondary | ICD-10-CM | POA: Diagnosis not present

## 2016-11-15 DIAGNOSIS — I2510 Atherosclerotic heart disease of native coronary artery without angina pectoris: Secondary | ICD-10-CM

## 2016-11-15 DIAGNOSIS — R531 Weakness: Secondary | ICD-10-CM

## 2016-11-15 DIAGNOSIS — Z880 Allergy status to penicillin: Secondary | ICD-10-CM

## 2016-11-15 DIAGNOSIS — D7389 Other diseases of spleen: Secondary | ICD-10-CM

## 2016-11-15 DIAGNOSIS — G309 Alzheimer's disease, unspecified: Secondary | ICD-10-CM

## 2016-11-15 DIAGNOSIS — W19XXXS Unspecified fall, sequela: Secondary | ICD-10-CM | POA: Diagnosis not present

## 2016-11-15 DIAGNOSIS — N6489 Other specified disorders of breast: Secondary | ICD-10-CM | POA: Diagnosis not present

## 2016-11-15 DIAGNOSIS — C770 Secondary and unspecified malignant neoplasm of lymph nodes of head, face and neck: Secondary | ICD-10-CM

## 2016-11-15 DIAGNOSIS — C3411 Malignant neoplasm of upper lobe, right bronchus or lung: Secondary | ICD-10-CM | POA: Diagnosis not present

## 2016-11-15 DIAGNOSIS — S2241XS Multiple fractures of ribs, right side, sequela: Secondary | ICD-10-CM | POA: Diagnosis not present

## 2016-11-15 DIAGNOSIS — Z8542 Personal history of malignant neoplasm of other parts of uterus: Secondary | ICD-10-CM

## 2016-11-15 DIAGNOSIS — K869 Disease of pancreas, unspecified: Secondary | ICD-10-CM

## 2016-11-15 DIAGNOSIS — E785 Hyperlipidemia, unspecified: Secondary | ICD-10-CM

## 2016-11-15 DIAGNOSIS — Z803 Family history of malignant neoplasm of breast: Secondary | ICD-10-CM

## 2016-11-15 DIAGNOSIS — Z8042 Family history of malignant neoplasm of prostate: Secondary | ICD-10-CM

## 2016-11-15 NOTE — Progress Notes (Signed)
Traci Holmes day:  11/15/2016  Chief Complaint: Traci Holmes is a 71 y.o. female with metastatic non-small cell lung cancer who is seen for 2 week assessment.  HPI:  The patient was last seen in the medical oncology Holmes on 11/08/2016.  At that time, we was doing well.  She denied any complaints.She was in the midst of cranial radiation.  Last fraction was on 11/09/2016.  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 Additional findings included microsatellite stable and tumor mutational burden intermediate: 14 Muts/MB. Genes with no mutational alterations including EGFR, KRAS, RET, ALK, MET, ERBB2, BRAF, ROS1.    Symptomatically, she denies any complaints.   Past Medical History:  Diagnosis Date  . Alzheimer disease   . Aphasia   . Cancer [facility])    Uterine Cancer-30 years ago with hysterectomy.  . Dementia   . Hyperlipemia   . Hypocalcemia   . Major neurocognitive disorder, unspecified 08/25/2014  . Seizures ([place])     Past Surgical History:  Procedure Laterality Date  . ABDOMINAL HYSTERECTOMY     Does not remember date 30-40 years ago?    Family History  Problem Relation Age of Onset  . Cancer Mother        breast  . Cancer Father        prostate    Social History:  reports that she has been smoking Cigarettes.  She has been smoking about 1.00 pack per day. She has never used smokeless tobacco. She reports that she does not drink alcohol or use drugs.  She lives at [facility] assisted [facility].  She has dementia. She  has 2 sons [name] lives in [place]).  She states that she wishes for [name] to be her medical power of attorney (no paperwork completed to-date). She also states her niece is "power of attorney", but does not wish her to make medical decisions for her if she cannot speak for herself.  Thayer Headings Civil engineer, contracting) from [facility] and  [place] representative (Ms. Iantha Fallen). Milana Kidney from [place] [phone]) should be contacted regarding patient decisions.  She is accompanied by 2 women.  Contact number is [phone].  Allergies:  Allergies  Allergen Reactions  . Penicillins Other (See Comments)    Reaction: unknown  Patient cannot answer follow-up questions    Current Medications: Current Outpatient Prescriptions  Medication Sig Dispense Refill  . acetaminophen (TYLENOL) 500 MG tablet Take 500 mg by mouth every 6 (six) hours as needed for fever or headache.     . albuterol (PROVENTIL HFA;VENTOLIN HFA) 108 (90 Base) MCG/ACT inhaler Inhale 2 puffs into the lungs every 6 (six) hours as needed for wheezing or shortness of breath. 1 Inhaler 0  . ARIPiprazole (ABILIFY) 2 MG tablet Take 1 mg by mouth daily.    . Cholecalciferol (VITAMIN D3) 50000 units CAPS Take by mouth.    . dexamethasone (DECADRON) 4 MG tablet Take 1 tablet (4 mg total) by mouth 3 (three) times daily. 30 tablet 0  . docusate sodium (COLACE) 100 MG capsule Take 2 capsules (200 mg total) by mouth 2 (two) times daily. 10 capsule 0  . fluticasone (FLONASE) 50 MCG/ACT nasal spray Place 1 spray into both nostrils 2 (two) times daily.    Marland Kitchen lactose free nutrition (BOOST) LIQD Take 237 mLs by mouth 3 (three) times daily between meals.    . levETIRAcetam (  KEPPRA) 500 MG tablet Take 1 tablet (500 mg total) by mouth 2 (two) times daily. (Patient taking differently: Take 500 mg by mouth 3 (three) times daily. ) 10 tablet 0  . memantine (NAMENDA) 10 MG tablet Take 10 mg by mouth 2 (two) times daily.    Marland Kitchen oxyCODONE (OXY IR/ROXICODONE) 5 MG immediate release tablet Take 1 tablet (5 mg total) by mouth every 4 (four) hours as needed for severe pain. 30 tablet 0  . promethazine (PHENERGAN) 25 MG tablet Take 25 mg by mouth every 6 (six) hours as needed for nausea or vomiting.    . sertraline (ZOLOFT) 100 MG tablet Take 100 mg by mouth daily.    Marland Kitchen  Spacer/Aero Chamber Mouthpiece MISC 1 Units by Does not apply route every 4 (four) hours as needed (wheezing). 1 each 0  . traMADol (ULTRAM) 50 MG tablet Take 50 mg by mouth every 6 (six) hours as needed.    . Vitamin D, Ergocalciferol, (DRISDOL) 50000 units CAPS capsule Take 50,000 Units by mouth every 7 (seven) days.     No current facility-administered medications for this visit.     Review of Systems:  GENERAL:  Feels "fine".  Weak.  No fevers or sweats.  Weight loss of 1 pound since last visit. PERFORMANCE STATUS (ECOG):  2-3. HEENT:  No visual changes, runny nose, sore throat, mouth sores or tenderness. Lungs: No shortness of breath or cough.  No hemoptysis. On oxygen 2 liter/min via [place]. Cardiac:  No chest pain, palpitations, orthopnea, or PND. GI:   No nausea, vomiting, diarrhea, constipation, melena or hematochezia.  Unaware of last colonoscopy. GU:  No urgency, frequency, dysuria, or hematuria. Musculoskeletal:  Right rib pain after fall (right 7th, 8th, and 9th rib fracture).  No back pain.  No joint pain.  No muscle tenderness. Extremities:  No pain or swelling. Skin:  No rashes or skin changes. Neuro:  No headache, numbness or weakness, balance or coordination issues. Endocrine:  No diabetes, thyroid issues, hot flashes or night sweats. Psych:  No mood changes, depression or anxiety. Pain:  Rib pain (see HPI). Review of systems:  All other systems reviewed and found to be negative.  Physical Exam: Blood pressure 129/75, pulse (!) 56, temperature 97.6 F (36.4 C), temperature source Tympanic, resp. rate 12, height 5' (1.524 m), weight 77 lb 9.6 oz (35.2 kg). GENERAL:  Thin pale woman sitting comfortably in a wheelchair in the consult room in no acute distress. MENTAL STATUS:  Alert and oriented to person, place and time. HEAD:  Long brown hair.  Right scalp alopecia.  Normocephalic, atraumatic, face symmetric, no Cushingoid features. EYES:  Blue eyes.  No conjunctivitis or  scleral icterus. ENT:  [place] in place.   NEUROLOGICAL: Dementia.  PSYCH:  Appropriate.   No visits with results within 3 Day(s) from this visit.  Latest known visit with results is:  Admission on 10/19/2016, Discharged on 10/20/2016  Component Date Value Ref Range Status  . Sodium 10/19/2016 134* 135 - 145 mmol/L Final  . Potassium 10/19/2016 4.6  3.5 - 5.1 mmol/L Final  . Chloride 10/19/2016 95* 101 - 111 mmol/L Final  . CO2 10/19/2016 29  22 - 32 mmol/L Final  . Glucose, Bld 10/19/2016 131* 65 - 99 mg/dL Final  . BUN 10/19/2016 31* 6 - 20 mg/dL Final  . Creatinine, Ser 10/19/2016 0.62  0.44 - 1.00 mg/dL Final  . Calcium 10/19/2016 9.5  8.9 - 10.3 mg/dL Final  . GFR calc  non Af Amer 10/19/2016 >60  >60 mL/min Final  . GFR calc Af Amer 10/19/2016 >60  >60 mL/min Final   Comment: (NOTE) The eGFR has been calculated using the CKD EPI equation. This calculation has not been validated in all clinical situations. eGFR's persistently <60 mL/min signify possible Chronic Kidney Disease.   . Anion gap 10/19/2016 10  5 - 15 Final  . WBC 10/19/2016 13.4* 3.6 - 11.0 K/uL Final  . RBC 10/19/2016 4.51  3.80 - 5.20 MIL/uL Final  . Hemoglobin 10/19/2016 14.2  12.0 - 16.0 g/dL Final  . HCT 90/46/7938 42.8  35.0 - 47.0 % Final  . MCV 10/19/2016 94.8  80.0 - 100.0 fL Final  . MCH 10/19/2016 31.5  26.0 - 34.0 pg Final  . MCHC 10/19/2016 33.2  32.0 - 36.0 g/dL Final  . RDW 53/70/8332 13.8  11.5 - 14.5 % Final  . Platelets 10/19/2016 248  150 - 440 K/uL Final  . WBC 10/19/2016 11.7* 3.6 - 11.0 K/uL Final  . RBC 10/19/2016 4.43  3.80 - 5.20 MIL/uL Final  . Hemoglobin 10/19/2016 14.2  12.0 - 16.0 g/dL Final  . HCT 73/80/9470 42.1  35.0 - 47.0 % Final  . MCV 10/19/2016 95.0  80.0 - 100.0 fL Final  . MCH 10/19/2016 32.0  26.0 - 34.0 pg Final  . MCHC 10/19/2016 33.7  32.0 - 36.0 g/dL Final  . RDW 12/26/3811 14.1  11.5 - 14.5 % Final  . Platelets 10/19/2016 234  150 - 440 K/uL Final  . Creatinine, Ser  10/19/2016 0.50  0.44 - 1.00 mg/dL Final  . GFR calc non Af Amer 10/19/2016 >60  >60 mL/min Final  . GFR calc Af Amer 10/19/2016 >60  >60 mL/min Final   Comment: (NOTE) The eGFR has been calculated using the CKD EPI equation. This calculation has not been validated in all clinical situations. eGFR's persistently <60 mL/min signify possible Chronic Kidney Disease.     Assessment:  Traci Holmes is a 71 y.o. female with stage IV non-small cell lung cancer.  She presented with neck adenopathy and dizziness s/p a fall.  Cervical lymph node biopsy on 10/12/2016 revealed a poorly differentiated carcinoma.  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  Tumor was cytokeratin AE1/AE3 positive and negative for CD45, chromogranin, synaptophysin, TTF-1, and weakly diffuse positive for  neuron specific enolase.  PDL-1 testing was negative (TPS 0%).  Foundation One testing on 10/29/2016 revealed the following genomic alterations: PTPN11 G503V, RB1 loss exon 17, TP53 splice site 920-1G>[id number]. Additional findings included microsatellite stable and tumor mutational burden intermediate: 14 Muts/MB. Genes with no mutational alterations including EGFR, KRAS, RET, ALK, MET, ERBB2, BRAF, ROS1.   Neck CT on 10/11/2016 revealed bilateral cervical adenopathy.  There was a 1.8 x 1.9 cm left level II lymph node. There were bilateral cm size level II, left level III, and bilateral level IV adenopathy. 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   Chest CT on 10/11/2016 revealed a 3.6 cm posterior apical right upper lobe mass compatible with primary bronchogenic carcinoma. There was a new 1.3 cm central right upper lobe and 0.5 cm anterior right lower lobe pulmonary nodule. There was bilateral hilar, bilateral mediastinal, bilateral supraclavicular, and porta hepatis adenopathy. There was a new left  adrenal nodule. There was a 4.4 cm partially visualized pancreatic tail cystic mass, increased in size,  indeterminate for cystic pancreatic malignancy.  There were indeterminate small bilateral low attenuation renal masses and a new small low-attenuation splenic lesion. There was focal nodular asymmetry in the deep upper-outer right breast.   Breast exam and ultrasound revealed a 1.5 cm simple cyst.  Head MRI on 10/15/2016 revealed multiple metastatic lesions throughout the brain.  She had mild LEFT-to-RIGHT shift of 1-2 mm.  She received whole brain radiation from 10/26/2016 - 11/09/2016. She is on Decadron and Keppra.  She has dementia.  She has had several falls.  Rib films on 10/19/2016 revealed minimally displaced fractures of lateral 7th, 8th, and 9th ribs.   Symptomatically, she is weak.  She needs assistance with her ADLs.  Exam reveals bilateral cervical adenopathy.  Plan: 1.  Discuss Foundation One testing.  No standard mutations positive.  Discuss intermediate tumor mutational burden (TMB).  FDA therapies for elevated tumor mutational burden included atezolizumab, durvalumab, nivolumab, and pembrolizumab.  Patient not eligible for a clinical trial.  Increase TMB may be associated with greater sensitivity to immunotherapeutic agents including anti-CTLA-4, anti-PDL1 and anti-PDL-1 therapies.   Pembrolizumab improved progression free survival (14.5 months versus 3.4 - 3.7 months) in patients with non-small cell lung cancer mutational burden greater than 200 nonsynonymous mutations.  Unknown if able to obtain one of these therapies.  Potential side effects of therapy reviewed.  Reviewed that patient is not a candidate for chemotherapy. Several questions asked and answered. 2.  Discuss plan for best supportive care of Hospice if unable to receive above treatment.  3.  MD/RN to call once medication approval or denial obtained 4.  RTC based on above.   Lequita Asal, MD  11/15/2016, 11:34  AM

## 2016-11-15 NOTE — Progress Notes (Signed)
Patient here for follow up she has had no changes since  Her last appointment she has completed radiation therapy.

## 2016-11-17 ENCOUNTER — Ambulatory Visit: Payer: Medicare Other | Admitting: Hematology and Oncology

## 2016-12-08 ENCOUNTER — Ambulatory Visit
Admission: RE | Admit: 2016-12-08 | Discharge: 2016-12-08 | Disposition: A | Discharge status: Home / Self Care | Payer: Medicare Other | Source: Ambulatory Visit | Attending: Radiation Oncology | Admitting: Radiation Oncology

## 2016-12-08 ENCOUNTER — Other Ambulatory Visit: Payer: Self-pay | Admitting: Hematology and Oncology

## 2016-12-08 ENCOUNTER — Encounter: Payer: Self-pay | Admitting: Radiation Oncology

## 2016-12-08 ENCOUNTER — Telehealth: Payer: Self-pay | Admitting: *Deleted

## 2016-12-08 VITALS — BP 126/59 | HR 69 | Resp 20 | Wt 76.2 lb

## 2016-12-08 DIAGNOSIS — Z923 Personal history of irradiation: Secondary | ICD-10-CM | POA: Insufficient documentation

## 2016-12-08 DIAGNOSIS — C7931 Secondary malignant neoplasm of brain: Secondary | ICD-10-CM

## 2016-12-08 DIAGNOSIS — C3411 Malignant neoplasm of upper lobe, right bronchus or lung: Secondary | ICD-10-CM | POA: Diagnosis not present

## 2016-12-08 NOTE — Progress Notes (Signed)
Radiation Oncology Follow up Note  Name: Traci Holmes   Date:   12/08/2016 MRN:  [id number] DOB: [birth year]    This 71 y.o. 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 She's had foundation 1 testing showing no standard mutations. Patient's caretakers are waiting notice from medical oncology whether she is a candidate for any type of immunotherapy. She is quite frail.  COMPLICATIONS OF TREATMENT: none  FOLLOW UP COMPLIANCE: keeps appointments   PHYSICAL EXAM:  BP (!) 126/59   Pulse 69   Resp 20   Wt 76 lb 2.7 oz (34.5 kg)   BMI 14.88 kg/m  Frail-appearing wheelchair-bound female in NAD. Crude visual fields are within normal range cranial nerves II through XII are grossly intact she has alopecia of the scalp no evidence of oral candidiasis Well-developed well-nourished patient in NAD. HEENT reveals PERLA, EOMI, discs not visualized.  Oral cavity is clear. No oral mucosal lesions are identified. Neck is clear without evidence of cervical or supraclavicular adenopathy. Lungs are clear to A&P. Cardiac examination is essentially unremarkable with regular rate and rhythm without murmur rub or thrill. Abdomen is benign with no organomegaly or masses noted. Motor sensory and DTR levels are equal and symmetric in the upper and lower extremities. Cranial nerves II through XII are grossly intact. Proprioception is intact. No peripheral adenopathy or edema is identified. No motor or sensory levels are noted. Crude visual fields are within normal range.  RADIOLOGY RESULTS: No current films for review  PLAN: At this time I have contacted medical oncology for follow-up  appointment to discuss possible immunotherapy. At this time I am going to turn follow-up care over to medical oncology. A she appears quite frail and may benefit from hospice services in the near future. We'll be happy to reevaluate her should any further palliative radiation therapy be indicated.  I would like to take this opportunity to thank you for allowing me to participate in the care of your patient.Armstead Peaks., MD

## 2016-12-08 NOTE — Telephone Encounter (Signed)
I called the guardian and let her know that Dr. Mike Gip has got the pharmacy to look into pt getting the drug prembroluzumab. Her insurance will not approve it because to today's date it is not approved for her cancer dx.  There are trials using the drug with her dx and we will submit a letter and attach the articles with trials to try and try and get it approved . Ms. Robina Ade asked how long it will take to get an answer. I told her I don't know but at least a week for sure.  She said that the patient is currently with palliative care and since she is getting weaker and more fragile could she have hospice services.  I told her I would ask [facility] but if we do get drug approved then pt will have to come off hospice services to get the med.  She understands I called guardian today and she had voicemail and I left message that [facility] was fine with transition to hospice and we will send form today but still keep in mind if she gets approval for the [place] then she will need to come off hospice to get the treatment

## 2016-12-09 ENCOUNTER — Encounter: Payer: Self-pay | Admitting: *Deleted

## 2016-12-09 ENCOUNTER — Telehealth: Payer: Self-pay | Admitting: *Deleted

## 2016-12-09 NOTE — Telephone Encounter (Signed)
Patient has stopped treatment and wants hospice referral. Please advise

## 2016-12-09 NOTE — Telephone Encounter (Signed)
Hospice referral completed and sent earlier today.

## 2016-12-09 NOTE — Progress Notes (Signed)
Hospice referral faxed to hospice/palliative care.

## 2017-01-21 DEATH — deceased

## 2017-02-02 DIAGNOSIS — F0280 Dementia in other diseases classified elsewhere without behavioral disturbance: Secondary | ICD-10-CM

## 2017-02-02 DIAGNOSIS — G309 Alzheimer's disease, unspecified: Secondary | ICD-10-CM

## 2017-02-02 DIAGNOSIS — C55 Malignant neoplasm of uterus, part unspecified: Secondary | ICD-10-CM

## 2017-02-02 DIAGNOSIS — S2241XA Multiple fractures of ribs, right side, initial encounter for closed fracture: Secondary | ICD-10-CM

## 2017-02-23 ENCOUNTER — Encounter: Payer: Self-pay | Admitting: Hematology and Oncology

## 2018-04-14 IMAGING — MR MR HEAD WO/W CM
9 of 13 series · 31 of 48 positions shown · IV contrast (7mL Multihance)
Comparison: CT head 10/14/16.  CT neck 10/11/2016.

CLINICAL DATA: Patient with lung cancer, concerning findings on CT
head. Unwitnessed fall in the shower.

EXAM:
MRI HEAD WITHOUT AND WITH CONTRAST
TECHNIQUE: Multiplanar, multiecho pulse sequences of the brain and surrounding
structures were obtained without and with intravenous contrast.
CONTRAST:  MultiHance 7 mL.

[Series 2: GRE · sagittal · 5.0mm · 0.45mm/px · 2 of 27 slices shown (1 of 3)]
[im 1/27]
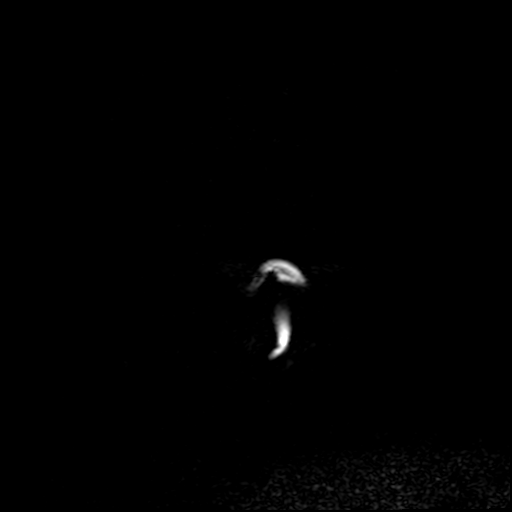
[im 27/27]
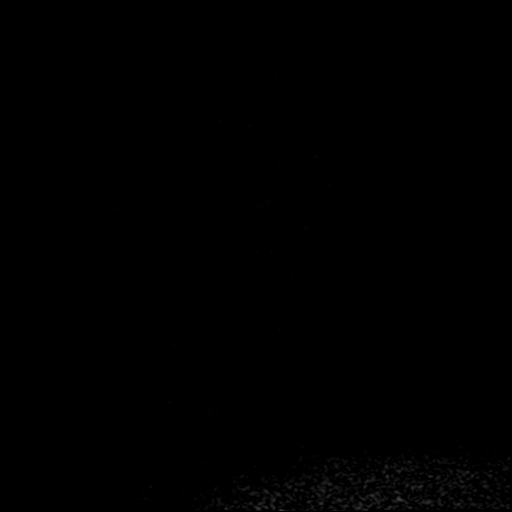

[Series 4: DWI · axial · 3.0mm · 1.80mm/px · z∈[-85,+74]mm · 5 of 55 slices shown (1 of 2)]
[im 1/55]
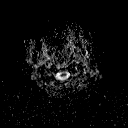
[im 14/55]
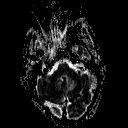
[im 28/55]
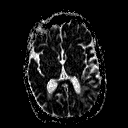
[im 41/55]
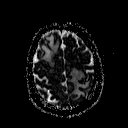
[im 55/55]
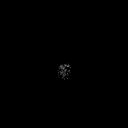

[Series 6: DWI · coronal · 3.0mm · 1.80mm/px · 4 of 49 slices shown (2 of 2)]
[im 1/49]
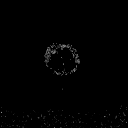
[im 17/49]
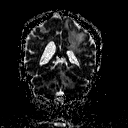
[im 33/49]
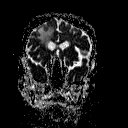
[im 49/49]
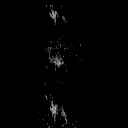

[Series 7: T2 · axial · 5.0mm · 0.45mm/px · z∈[-82,+71]mm · 3 of 25 slices shown (1 of 3)]
[im 1/25]
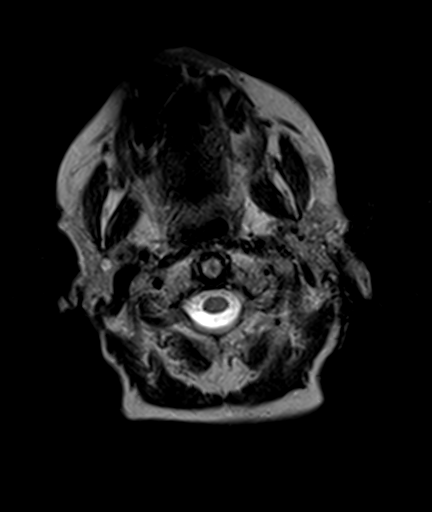
[im 13/25]
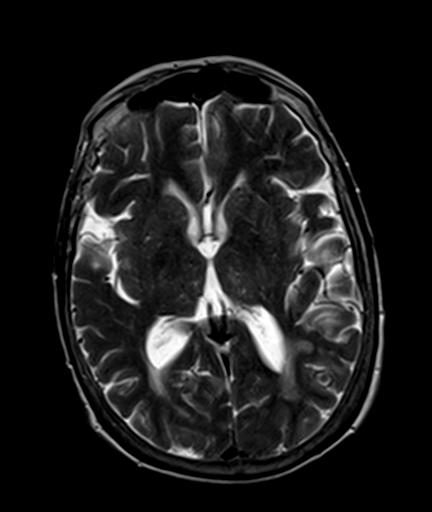
[im 25/25]
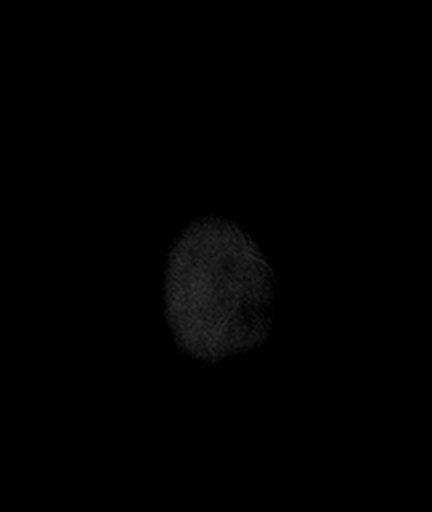

[Series 8: FLAIR · axial · 3.0mm · 0.45mm/px · z∈[-82,+71]mm · 6 of 53 slices shown]
[im 1/53]
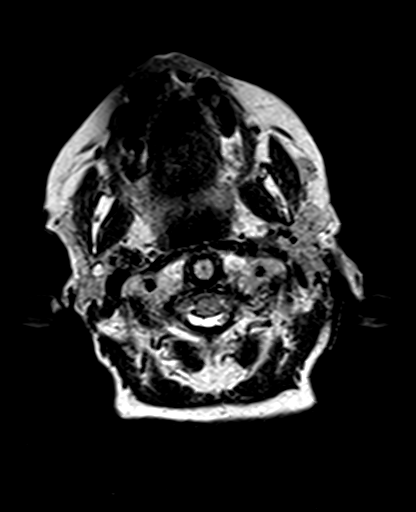
[im 11/53]
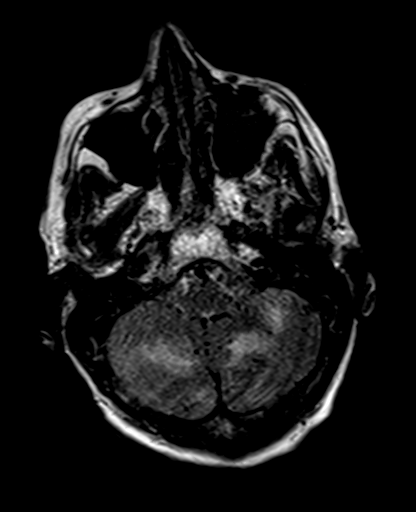
[im 21/53]
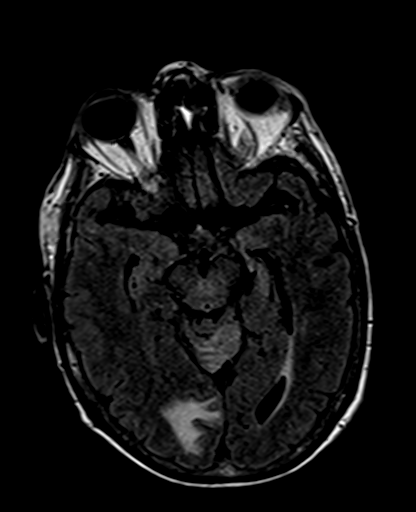
[im 32/53]
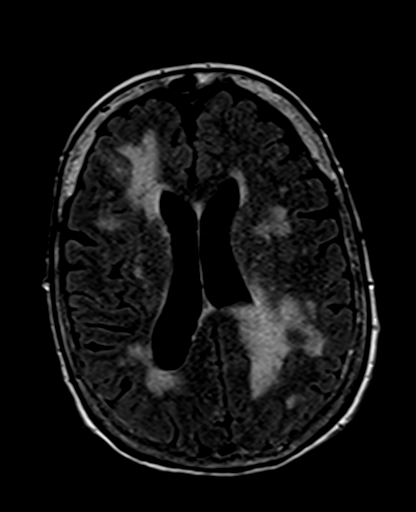
[im 42/53]
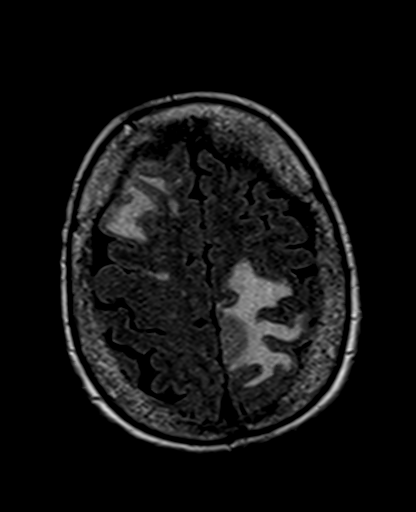
[im 53/53]
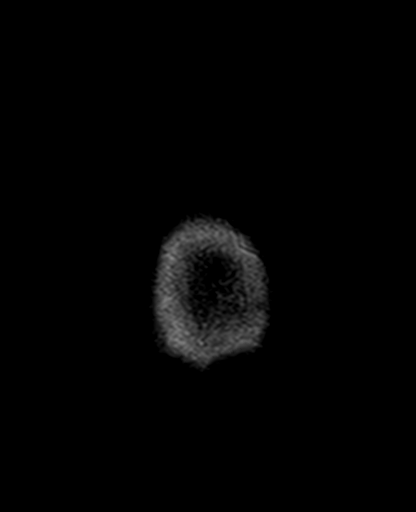

[Series 9: T2 · axial · 5.0mm · 1.20mm/px · z∈[-82,+71]mm · 3 of 25 slices shown (2 of 3)]
[im 1/25]
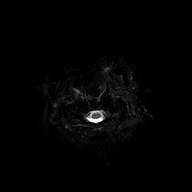
[im 13/25]
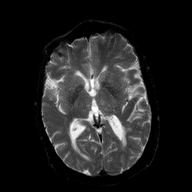
[im 25/25]
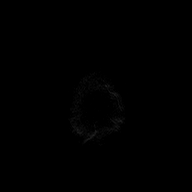

[Series 10: GRE · axial · 5.0mm · 0.45mm/px · z∈[-82,+71]mm · 3 of 25 slices shown (2 of 3)]
[im 1/25]
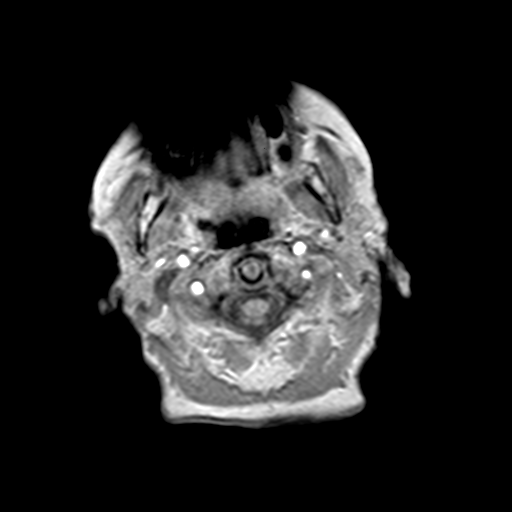
[im 13/25]
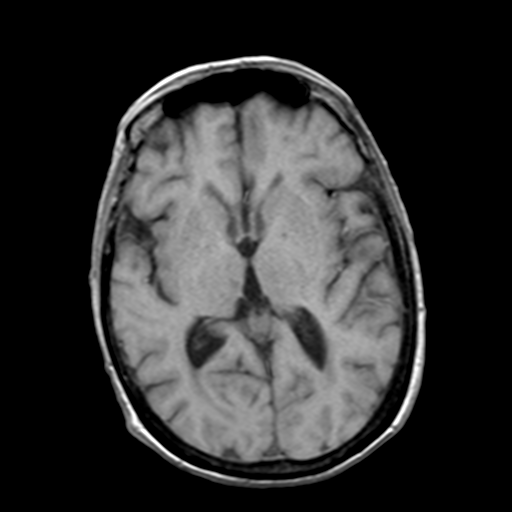
[im 25/25]
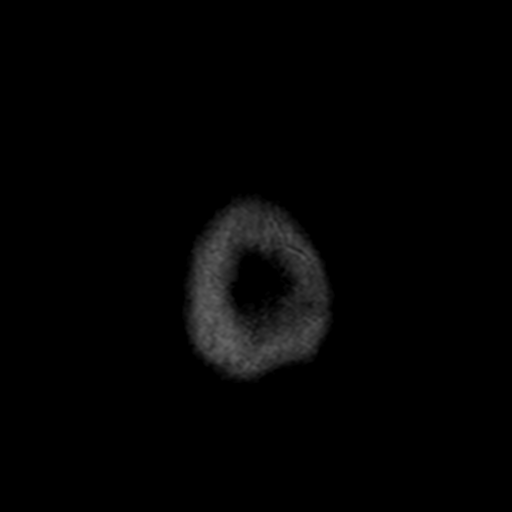

[Series 11: T2 · coronal · 5.0mm · 0.45mm/px · 3 of 27 slices shown (3 of 3)]
[im 1/27]
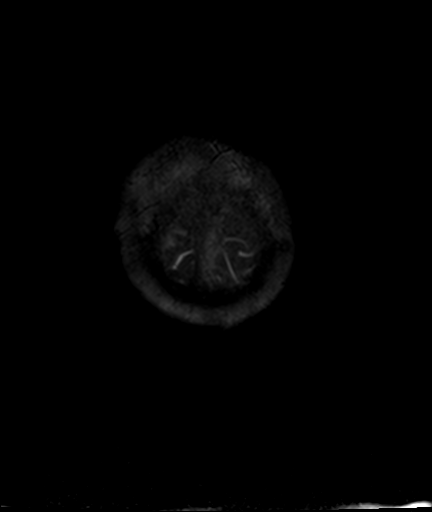
[im 14/27]
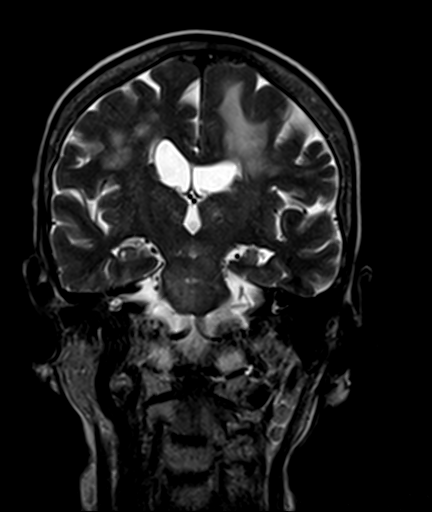
[im 27/27]
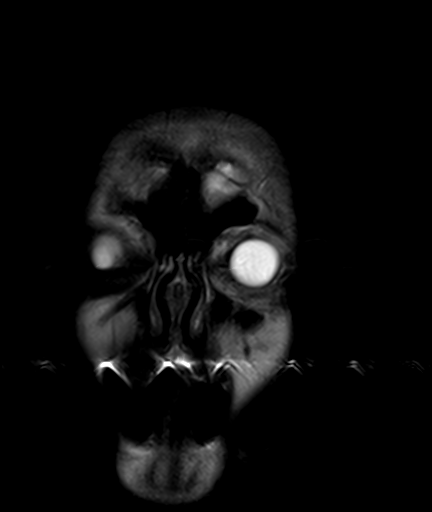

[Series 12: GRE · axial · 5.0mm · 0.45mm/px · z∈[-82,-6]mm · 2 of 25 slices shown (3 of 3)]
[im 1/25]
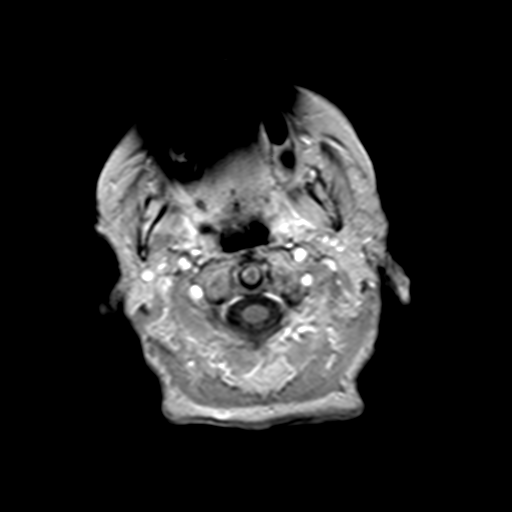
[im 13/25]
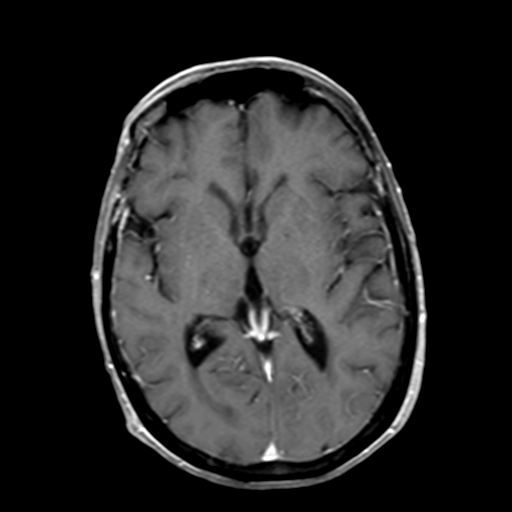

[31 of 48 positions shown; findings below may reference images not displayed]

FINDINGS: Brain: Multiple metastatic lesions are seen throughout the brain,
some of which display restricted diffusion and central necrosis. The
majority of these are in the posterior fossa, affecting both
cerebellar hemispheres. Marked vasogenic edema is associated with
the the RIGHT frontal and LEFT parietal lesions although vasogenic
edema is also observed in both cerebellar hemispheres.

Index lesion in the RIGHT frontal lobe, superficial location, slight
dural thickening, measures 17 mm diameter. Only mild LEFT-to-RIGHT
shift is observed, 1-2 mm.

Due to the multiplicity, as well as size of cerebellar lesions,
along with associated vasogenic edema, the patient is at risk for
obstructive hydrocephalus.

Vascular: Normal flow voids.

Skull and upper cervical spine: No definite osseous lesions.

Sinuses/Orbits: No sinus or mastoid disease.

Other: None.
IMPRESSION: Multiple metastatic lesions throughout the brain, as suspected from
previous CT scans. Mild LEFT-to-RIGHT shift of 1-2 mm.

The multiplicity and size of BILATERAL cerebellar lesions puts the
patient at risk for obstructive hydrocephalus. Close clinical
surveillance is recommended.
